# Patient Record
Sex: Female | Born: 1955
Health system: Southern US, Community
[De-identification: ages and names within clinical notes are randomized; demographics above are authoritative.]

## PROBLEM LIST (undated history)

## (undated) DIAGNOSIS — Z889 Allergy status to unspecified drugs, medicaments and biological substances status: Secondary | ICD-10-CM

## (undated) DIAGNOSIS — T7840XA Allergy, unspecified, initial encounter: Secondary | ICD-10-CM

## (undated) DIAGNOSIS — C50919 Malignant neoplasm of unspecified site of unspecified female breast: Secondary | ICD-10-CM

## (undated) DIAGNOSIS — C801 Malignant (primary) neoplasm, unspecified: Secondary | ICD-10-CM

## (undated) DIAGNOSIS — Z923 Personal history of irradiation: Secondary | ICD-10-CM

## (undated) DIAGNOSIS — Z800 Family history of malignant neoplasm of digestive organs: Secondary | ICD-10-CM

## (undated) DIAGNOSIS — J189 Pneumonia, unspecified organism: Secondary | ICD-10-CM

## (undated) DIAGNOSIS — Z973 Presence of spectacles and contact lenses: Secondary | ICD-10-CM

## (undated) DIAGNOSIS — E7800 Pure hypercholesterolemia, unspecified: Secondary | ICD-10-CM

## (undated) DIAGNOSIS — I10 Essential (primary) hypertension: Secondary | ICD-10-CM

## (undated) DIAGNOSIS — Z803 Family history of malignant neoplasm of breast: Secondary | ICD-10-CM

## (undated) DIAGNOSIS — R7303 Prediabetes: Secondary | ICD-10-CM

## (undated) DIAGNOSIS — Z972 Presence of dental prosthetic device (complete) (partial): Secondary | ICD-10-CM

## (undated) HISTORY — PX: WISDOM TOOTH EXTRACTION: SHX21

## (undated) HISTORY — DX: Malignant (primary) neoplasm, unspecified: C80.1

## (undated) HISTORY — PX: COLONOSCOPY: SHX174

## (undated) HISTORY — DX: Family history of malignant neoplasm of breast: Z80.3

## (undated) HISTORY — PX: TUBAL LIGATION: SHX77

## (undated) HISTORY — PX: BREAST LUMPECTOMY: SHX2

## (undated) HISTORY — DX: Allergy, unspecified, initial encounter: T78.40XA

## (undated) HISTORY — DX: Family history of malignant neoplasm of digestive organs: Z80.0

## (undated) HISTORY — PX: ABDOMINAL HYSTERECTOMY: SHX81

## (undated) HISTORY — DX: Malignant neoplasm of unspecified site of unspecified female breast: C50.919

---

## 2000-07-02 ENCOUNTER — Other Ambulatory Visit: Admission: RE | Admit: 2000-07-02 | Discharge: 2000-07-02 | Payer: Self-pay | Admitting: *Deleted

## 2001-02-13 ENCOUNTER — Other Ambulatory Visit: Admission: RE | Admit: 2001-02-13 | Discharge: 2001-02-13 | Payer: Self-pay | Admitting: *Deleted

## 2001-02-13 ENCOUNTER — Encounter: Admission: RE | Admit: 2001-02-13 | Discharge: 2001-02-13 | Payer: Self-pay | Admitting: *Deleted

## 2001-02-13 ENCOUNTER — Encounter: Payer: Self-pay | Admitting: *Deleted

## 2001-02-13 ENCOUNTER — Encounter (INDEPENDENT_AMBULATORY_CARE_PROVIDER_SITE_OTHER): Payer: Self-pay | Admitting: *Deleted

## 2001-03-03 ENCOUNTER — Encounter (INDEPENDENT_AMBULATORY_CARE_PROVIDER_SITE_OTHER): Payer: Self-pay | Admitting: Specialist

## 2001-03-03 ENCOUNTER — Ambulatory Visit (HOSPITAL_BASED_OUTPATIENT_CLINIC_OR_DEPARTMENT_OTHER): Admission: RE | Admit: 2001-03-03 | Discharge: 2001-03-03 | Payer: Self-pay | Admitting: *Deleted

## 2003-04-08 ENCOUNTER — Other Ambulatory Visit: Admission: RE | Admit: 2003-04-08 | Discharge: 2003-04-08 | Payer: Self-pay | Admitting: *Deleted

## 2005-01-02 ENCOUNTER — Other Ambulatory Visit: Admission: RE | Admit: 2005-01-02 | Discharge: 2005-01-02 | Payer: Self-pay | Admitting: *Deleted

## 2006-03-12 ENCOUNTER — Other Ambulatory Visit: Admission: RE | Admit: 2006-03-12 | Discharge: 2006-03-12 | Payer: Self-pay | Admitting: *Deleted

## 2008-01-22 ENCOUNTER — Other Ambulatory Visit: Admission: RE | Admit: 2008-01-22 | Discharge: 2008-01-22 | Payer: Self-pay | Admitting: Gynecology

## 2008-06-03 ENCOUNTER — Emergency Department (HOSPITAL_COMMUNITY): Admission: EM | Admit: 2008-06-03 | Discharge: 2008-06-04 | Payer: Self-pay | Admitting: Emergency Medicine

## 2008-07-02 ENCOUNTER — Ambulatory Visit: Payer: Self-pay | Admitting: Internal Medicine

## 2008-07-16 ENCOUNTER — Encounter: Payer: Self-pay | Admitting: Internal Medicine

## 2008-07-16 ENCOUNTER — Ambulatory Visit: Payer: Self-pay | Admitting: Internal Medicine

## 2008-07-19 ENCOUNTER — Encounter: Payer: Self-pay | Admitting: Internal Medicine

## 2008-08-03 ENCOUNTER — Encounter: Admission: RE | Admit: 2008-08-03 | Discharge: 2008-08-03 | Payer: Self-pay | Admitting: Radiology

## 2008-08-10 ENCOUNTER — Ambulatory Visit (HOSPITAL_COMMUNITY): Admission: RE | Admit: 2008-08-10 | Discharge: 2008-08-10 | Payer: Self-pay | Admitting: General Surgery

## 2008-08-10 ENCOUNTER — Encounter (INDEPENDENT_AMBULATORY_CARE_PROVIDER_SITE_OTHER): Payer: Self-pay | Admitting: General Surgery

## 2008-08-13 ENCOUNTER — Ambulatory Visit: Payer: Self-pay | Admitting: Oncology

## 2008-08-17 ENCOUNTER — Ambulatory Visit: Admission: RE | Admit: 2008-08-17 | Discharge: 2008-11-15 | Payer: Self-pay | Admitting: Radiation Oncology

## 2008-10-18 ENCOUNTER — Ambulatory Visit: Payer: Self-pay | Admitting: Oncology

## 2008-11-01 LAB — COMPREHENSIVE METABOLIC PANEL
ALT: 12 U/L (ref 0–35)
AST: 17 U/L (ref 0–37)
Albumin: 4 g/dL (ref 3.5–5.2)
Alkaline Phosphatase: 37 U/L — ABNORMAL LOW (ref 39–117)
BUN: 16 mg/dL (ref 6–23)
CO2: 32 mEq/L (ref 19–32)
Calcium: 9.8 mg/dL (ref 8.4–10.5)
Chloride: 97 mEq/L (ref 96–112)
Creatinine, Ser: 1.07 mg/dL (ref 0.40–1.20)
Glucose, Bld: 106 mg/dL — ABNORMAL HIGH (ref 70–99)
Potassium: 3.7 mEq/L (ref 3.5–5.3)
Sodium: 139 mEq/L (ref 135–145)
Total Bilirubin: 0.6 mg/dL (ref 0.3–1.2)
Total Protein: 7.5 g/dL (ref 6.0–8.3)

## 2008-11-01 LAB — CBC WITH DIFFERENTIAL/PLATELET
BASO%: 0.4 % (ref 0.0–2.0)
Basophils Absolute: 0 10*3/uL (ref 0.0–0.1)
EOS%: 8.6 % — ABNORMAL HIGH (ref 0.0–7.0)
Eosinophils Absolute: 0.3 10*3/uL (ref 0.0–0.5)
HCT: 35.4 % (ref 34.8–46.6)
HGB: 12.1 g/dL (ref 11.6–15.9)
LYMPH%: 18.8 % (ref 14.0–48.0)
MCH: 31.8 pg (ref 26.0–34.0)
MCHC: 34.1 g/dL (ref 32.0–36.0)
MCV: 93.3 fL (ref 81.0–101.0)
MONO#: 0.3 10*3/uL (ref 0.1–0.9)
MONO%: 7.1 % (ref 0.0–13.0)
NEUT#: 2.5 10*3/uL (ref 1.5–6.5)
NEUT%: 65.1 % (ref 39.6–76.8)
Platelets: 239 10*3/uL (ref 145–400)
RBC: 3.8 10*6/uL (ref 3.70–5.32)
RDW: 13.2 % (ref 11.3–14.5)
WBC: 3.9 10*3/uL (ref 3.9–10.0)
lymph#: 0.7 10*3/uL — ABNORMAL LOW (ref 0.9–3.3)

## 2009-01-03 ENCOUNTER — Ambulatory Visit: Payer: Self-pay | Admitting: Oncology

## 2009-01-05 LAB — CBC WITH DIFFERENTIAL/PLATELET
BASO%: 0.7 % (ref 0.0–2.0)
Basophils Absolute: 0 10*3/uL (ref 0.0–0.1)
EOS%: 1.4 % (ref 0.0–7.0)
Eosinophils Absolute: 0 10*3/uL (ref 0.0–0.5)
HCT: 34.9 % (ref 34.8–46.6)
HGB: 12.1 g/dL (ref 11.6–15.9)
LYMPH%: 34.3 % (ref 14.0–49.7)
MCH: 32.1 pg (ref 25.1–34.0)
MCHC: 34.6 g/dL (ref 31.5–36.0)
MCV: 92.7 fL (ref 79.5–101.0)
MONO#: 0.2 10*3/uL (ref 0.1–0.9)
MONO%: 8 % (ref 0.0–14.0)
NEUT#: 1.6 10*3/uL (ref 1.5–6.5)
NEUT%: 55.6 % (ref 38.4–76.8)
Platelets: 238 10*3/uL (ref 145–400)
RBC: 3.76 10*6/uL (ref 3.70–5.45)
RDW: 13 % (ref 11.2–14.5)
WBC: 2.8 10*3/uL — ABNORMAL LOW (ref 3.9–10.3)
lymph#: 1 10*3/uL (ref 0.9–3.3)

## 2009-01-05 LAB — COMPREHENSIVE METABOLIC PANEL
ALT: <8 U/L (ref 0–35)
AST: 11 U/L (ref 0–37)
Albumin: 4.2 g/dL (ref 3.5–5.2)
Alkaline Phosphatase: 43 U/L (ref 39–117)
BUN: 7 mg/dL (ref 6–23)
CO2: 29 mEq/L (ref 19–32)
Calcium: 9.7 mg/dL (ref 8.4–10.5)
Chloride: 99 mEq/L (ref 96–112)
Creatinine, Ser: 0.76 mg/dL (ref 0.40–1.20)
Glucose, Bld: 88 mg/dL (ref 70–99)
Potassium: 3.6 mEq/L (ref 3.5–5.3)
Sodium: 138 mEq/L (ref 135–145)
Total Bilirubin: 0.5 mg/dL (ref 0.3–1.2)
Total Protein: 7.5 g/dL (ref 6.0–8.3)

## 2009-04-22 ENCOUNTER — Ambulatory Visit: Payer: Self-pay | Admitting: Oncology

## 2009-04-27 LAB — COMPREHENSIVE METABOLIC PANEL
ALT: 8 U/L (ref 0–35)
AST: 13 U/L (ref 0–37)
Albumin: 4.3 g/dL (ref 3.5–5.2)
Alkaline Phosphatase: 43 U/L (ref 39–117)
BUN: 10 mg/dL (ref 6–23)
CO2: 29 mEq/L (ref 19–32)
Calcium: 9.8 mg/dL (ref 8.4–10.5)
Chloride: 101 mEq/L (ref 96–112)
Creatinine, Ser: 0.9 mg/dL (ref 0.40–1.20)
Glucose, Bld: 93 mg/dL (ref 70–99)
Potassium: 3.8 mEq/L (ref 3.5–5.3)
Sodium: 139 mEq/L (ref 135–145)
Total Bilirubin: 0.4 mg/dL (ref 0.3–1.2)
Total Protein: 7.3 g/dL (ref 6.0–8.3)

## 2009-04-27 LAB — CBC WITH DIFFERENTIAL/PLATELET
BASO%: 0.6 % (ref 0.0–2.0)
Basophils Absolute: 0 10*3/uL (ref 0.0–0.1)
EOS%: 1.7 % (ref 0.0–7.0)
Eosinophils Absolute: 0.1 10*3/uL (ref 0.0–0.5)
HCT: 35.3 % (ref 34.8–46.6)
HGB: 12 g/dL (ref 11.6–15.9)
LYMPH%: 38.2 % (ref 14.0–49.7)
MCH: 31.8 pg (ref 25.1–34.0)
MCHC: 34.1 g/dL (ref 31.5–36.0)
MCV: 93.3 fL (ref 79.5–101.0)
MONO#: 0.3 10*3/uL (ref 0.1–0.9)
MONO%: 8.5 % (ref 0.0–14.0)
NEUT#: 1.6 10*3/uL (ref 1.5–6.5)
NEUT%: 51 % (ref 38.4–76.8)
Platelets: 269 10*3/uL (ref 145–400)
RBC: 3.78 10*6/uL (ref 3.70–5.45)
RDW: 13.4 % (ref 11.2–14.5)
WBC: 3.2 10*3/uL — ABNORMAL LOW (ref 3.9–10.3)
lymph#: 1.2 10*3/uL (ref 0.9–3.3)

## 2009-07-25 ENCOUNTER — Ambulatory Visit: Payer: Self-pay | Admitting: Oncology

## 2010-11-02 NOTE — Procedures (Signed)
Summary: Colonoscopy   Colonoscopy  Procedure date:  07/16/2008  Findings:      Location:  [facility].    Procedures Next Due Date:    Colonoscopy: 07/2015  Patient Name: Emily Ross, Emily Ross. MRN:  Procedure Procedures: Colonoscopy CPT: [phone].    with biopsy. CPT: Q5068410.  Personnel: Endoscopist: Dora L. Juanda Chance, MD.  Exam Location: Exam performed in Outpatient Clinic. Outpatient  Patient Consent: Procedure, Alternatives, Risks and Benefits discussed, consent obtained, from patient. Consent was obtained by the RN.  Indications  Average Risk Screening Routine.  History  Current Medications: Patient is not currently taking Coumadin.  Pre-Exam Physical: Performed Jul 16, 2008. Entire physical exam was normal.  Comments: Pt. history reviewed/updated, physical exam performed prior to initiation of sedation?yes Exam Exam: Extent of exam reached: Cecum, extent intended: Cecum.  The cecum was identified by appendiceal orifice and IC valve. Duration of exam: time in 8:51 min, out 8:10 min minutes. Colon retroflexion performed. Images taken. ASA Classification: I. Tolerance: good.  Monitoring: Pulse and BP monitoring, Oximetry used. Supplemental O2 given.  Colon Prep Used Miralax for colon prep. Prep results: good.  Sedation Meds: Patient assessed and found to be appropriate for moderate (conscious) sedation. Fentanyl 75 mcg. given IV. Versed 9 mg. given IV.  Findings POLYP: Descending Colon, Maximum size: 4 mm. sessile polyp. Distance from Anus 50 cm. Procedure:  biopsy without cautery, The polyp was removed piece meal. removed, retrieved, Polyp sent to pathology. ICD9: Colon Polyps: 211.3.  - NORMAL EXAM: Cecum.  - NORMAL EXAM: Rectum.   Assessment Abnormal examination, see findings above.  Diagnoses: 211.3: Colon Polyps.   Comments: left colon polyp removed with cold biopsies Events  Unplanned Interventions: No intervention was required.    Unplanned Events: There were no complications. Plans Medication Plan: Await pathology.  Patient Education: Patient given standard instructions for: Patient instructed to get routine colonoscopy every 7 years.  Comments: recall 7-10 years depending on polyp report Disposition: After procedure patient sent to recovery. After recovery patient sent home.    cc: Della Goo, MD   REPORT OF SURGICAL PATHOLOGY   Case #: [id number] Patient Name: Emily Ross, Emily Ross Office Chart Number:  [id number]   MRN: [id number] Pathologist: Alden Server A. Delila Spence, MD DOB/Age  [birth year] (Age: 38)    Gender: F Date Taken:  07/16/2008 Date Received: 07/16/2008   FINAL DIAGNOSIS   ***MICROSCOPIC EXAMINATION AND DIAGNOSIS***   COLON AT 50 CM, POLYP(S):  HYPERPLASTIC POLYP.  NO ADENOMATOUS CHANGE OR MALIGNANCY IDENTIFIED.   mj Date Reported:  07/19/2008     Alden Server A. Delila Spence, MD *** Electronically Signed Out By EAA ***     July 19, 2008 MRN: [id number]    [facility] [address] [place], [place]  [address]    Dear Ms. CHAPPELL,  I am pleased to inform you that the colon polyp(s) removed during your recent colonoscopy was (were) found to be benign (no cancer detected) upon pathologic examination.The polyp was hyperplastic ( not precancerous)  I recommend you have a repeat colonoscopy examination in _7 years to look for recurrent polyps, as having colon polyps increases your risk for having recurrent polyps or even colon cancer in the future.  Should you develop new or worsening symptoms of abdominal pain, bowel habit changes or bleeding from the rectum or bowels, please schedule an evaluation with either your primary care physician or with me.  Additional information/recommendations:  _x_ No further action with gastroenterology is needed at this time. Please  follow-up with your primary care physician for your other healthcare      needs.   Please call us if you  are having persistent problems or have questions about your condition that have not been fully answered at this time.  Sincerely,  Hart Carwin MD  This letter has been electronically signed by your physician.   Signed by Hart Carwin MD on 07/19/2008 at 9:36 PM  ________________________________________________________________________  This report was created from the original endoscopy report, which was reviewed and signed by the above listed endoscopist.

## 2010-11-02 NOTE — Letter (Signed)
Summary: Patient Notice- Polyp Results  [facility]  [address] [place], [place] [address]   Phone: [phone]  Fax: [phone]        July 19, 2008 MRN: [id number]    [facility] [address] [place], [place]  [address]    Dear Ms. CHAPPELL,  I am pleased to inform you that the colon polyp(s) removed during your recent colonoscopy was (were) found to be benign (no cancer detected) upon pathologic examination.The polyp was hyperplastic ( not precancerous)  I recommend you have a repeat colonoscopy examination in _7 years to look for recurrent polyps, as having colon polyps increases your risk for having recurrent polyps or even colon cancer in the future.  Should you develop new or worsening symptoms of abdominal pain, bowel habit changes or bleeding from the rectum or bowels, please schedule an evaluation with either your primary care physician or with me.  Additional information/recommendations:  _x_ No further action with gastroenterology is needed at this time. Please      follow-up with your primary care physician for your other healthcare      needs.   Please call us if you are having persistent problems or have questions about your condition that have not been fully answered at this time.  Sincerely,  Hart Carwin MD  This letter has been electronically signed by your physician.

## 2011-02-13 NOTE — Op Note (Signed)
NAME:  Ross, Emily             ACCOUNT NO.:  [id number]   MEDICAL RECORD NO.:  [id number]          PATIENT TYPE:  AMB   LOCATION:  SDS                          FACILITY:  [facility]   PHYSICIAN:  Lennie Muckle, MD      DATE OF BIRTH:  1956/04/23   DATE OF PROCEDURE:  08/10/2008  DATE OF DISCHARGE:  08/10/2008                               OPERATIVE REPORT   PREOPERATIVE DIAGNOSIS:  Ductal carcinoma in situ, left breast.   POSTOPERATIVE DIAGNOSIS:  Ductal carcinoma in situ, left breast.   PROCEDURE:  Left needle-localized left breast lumpectomy.   SURGEON:  Lennie Muckle, MD   ASSISTANT:  Kelle Darting. Rennis Harding, NP   ANESTHESIA:  General endotracheal anesthesia.Marland Kitchen   FINDINGS:  Wire in position in the middle of the specimen confirmed by  [facility].  I also took an additional medial inferior  margin of the breast.   ESTIMATED BLOOD LOSS:  100 mL.   COMPLICATIONS:  No immediate complications.   DRAINS:  No drains were placed.   INDICATIONS FOR PROCEDURE:  [name] is a 55 year old female who had  abnormal screening mammogram.  Biopsy revealed DCIS.  Specimen appeared  to be 0.5 cm in size.  Due to the size and inability to palpate, I  discussed with the patient performing on needle-localized left breast  lumpectomy.  The risk of surgery including but not limited to bleeding,  infection, hematoma, and possible need a re-excision were explained to  [name].  Informed consent was obtained prior to the procedure.   PROCEDURE IN DETAIL:  [name] was identified in the preoperative  holding area.  She had come from [facility] where she had had the  wire placed in her left breast.  I had marked the left breast  preoperatively.  She received 1 g of Kefzol and taken to the operating  room.  Once she was the operating room, she was placed in the supine  position.  Her left breast was prepped and draped in the usual sterile  fashion.  A time-out procedure  indicating the patient and procedure were  performed.  I placed an incision in the left breast just around the  areola.  I created a skin flap superiorly to bring the wire into the  wound.  I then using electrocautery dissected out the specimen.  I  attempted to keep the wire in position with an Allis clamp.  After the  specimen was removed, bleeding was controlled with electrocautery.  I  elected to take an additional inferior medial margin due to the  proximity and closeness of the wire.  The specimen sent to Dr. Yolanda Bonine  for analysis.  The wire was in the middle of the specimen.  I did orient  the specimen with suture material.  Please see the pathology for this.  I then irrigated the wound bed.  A small amount of oozing was controlled  with electrocautery.  Final irrigation revealed no bleeding.  I then  reapproximated the breast tissue with a 3-0 Vicryl suture.  Dermis was  closed with 3-0 Vicryl  and skin was closed with 4-0 Monocryl.  Steri-  Strip was placed for final dressing.  The patient had a fluff dressing  and then had her brassiere applied in the operating room.  She was then  extubated and transferred to the Postanesthesia Care Unit in stable  condition.   She will follow up with me in approximately 2-3 weeks.  I will call her  this Friday when her pathology becomes available.      Lennie Muckle, MD  Electronically Signed     ALA/MEDQ  D:  08/10/2008  T:  08/11/2008  Job:  [id number]   cc:   Claris Che L. Yolanda Bonine, MD  Della Goo, M.D.

## 2011-05-22 ENCOUNTER — Emergency Department (INDEPENDENT_AMBULATORY_CARE_PROVIDER_SITE_OTHER): Payer: Self-pay

## 2011-05-22 ENCOUNTER — Emergency Department (HOSPITAL_BASED_OUTPATIENT_CLINIC_OR_DEPARTMENT_OTHER)
Admission: EM | Admit: 2011-05-22 | Discharge: 2011-05-22 | Disposition: A | Discharge status: Home / Self Care | Payer: Self-pay | Attending: Emergency Medicine | Admitting: Emergency Medicine

## 2011-05-22 ENCOUNTER — Encounter: Payer: Self-pay | Admitting: Student

## 2011-05-22 DIAGNOSIS — W19XXXA Unspecified fall, initial encounter: Secondary | ICD-10-CM

## 2011-05-22 DIAGNOSIS — S81009A Unspecified open wound, unspecified knee, initial encounter: Secondary | ICD-10-CM | POA: Insufficient documentation

## 2011-05-22 DIAGNOSIS — I10 Essential (primary) hypertension: Secondary | ICD-10-CM | POA: Insufficient documentation

## 2011-05-22 DIAGNOSIS — M25569 Pain in unspecified knee: Secondary | ICD-10-CM

## 2011-05-22 DIAGNOSIS — S81012A Laceration without foreign body, left knee, initial encounter: Secondary | ICD-10-CM

## 2011-05-22 HISTORY — DX: Essential (primary) hypertension: I10

## 2011-05-22 MED ORDER — TETANUS-DIPHTH-ACELL PERTUSSIS 5-2.5-18.5 LF-MCG/0.5 IM SUSP
0.5000 mL | Freq: Once | INTRAMUSCULAR | Status: AC
  Administered 2011-05-22: 0.5 mL via INTRAMUSCULAR
  Filled 2011-05-22: qty 0.5

## 2011-05-22 MED ORDER — LIDOCAINE HCL (PF) 1 % IJ SOLN
30.0000 mL | Freq: Once | INTRAMUSCULAR | Status: AC
  Administered 2011-05-22: 30 mL
  Filled 2011-05-22: qty 5

## 2011-05-22 NOTE — ED Notes (Signed)
Pt in with c/o left knee pain s/p fall today, able bear weight.

## 2011-05-22 NOTE — ED Provider Notes (Signed)
History     CSN: [id number] Arrival date & time: 05/22/2011  7:39 PM  Chief Complaint  Patient presents with  . Knee Pain   Patient is a 55 y.o. female presenting with knee pain. The history is provided by the patient.  Knee Pain This is a new problem. The current episode started today. The problem occurs constantly. The problem has been unchanged. The symptoms are aggravated by nothing. She has tried nothing for the symptoms. The treatment provided mild relief.  Knee Pain This is a new problem. The current episode started today. The problem occurs constantly. The problem has been unchanged. The symptoms are aggravated by nothing. She has tried nothing for the symptoms. The treatment provided mild relief.  Pt reports she slipped and fell and hit her left knee.  Pt reports a rock stuck in her knee.  Pt pulled out rock.  Past Medical History  Diagnosis Date  . Hypertension     Past Surgical History  Procedure Date  . Breast lumpectomy     History reviewed. No pertinent family history.  History  Substance Use Topics  . Smoking status: Never Smoker   . Smokeless tobacco: Never Used  . Alcohol Use: No    OB History    Grav Para Term Preterm Abortions TAB SAB Ect Mult Living                  Review of Systems  Constitutional: Negative.   Skin: Positive for wound.  All other systems reviewed and are negative.    Physical Exam  BP 154/81  Pulse 84  Temp(Src) 98.6 F (37 C) (Oral)  Resp 20  SpO2 98%  LMP 04/25/2011  Physical Exam  Nursing note and vitals reviewed. Constitutional: She is oriented to person, place, and time. She appears well-developed and well-nourished.  HENT:  Head: Normocephalic and atraumatic.  Eyes: Conjunctivae and EOM are normal. Pupils are equal, round, and reactive to light.  Neck: Normal range of motion.  Musculoskeletal:       2cm laceration left knee gapping,  Obvious foreign bodies.  Neurological: She is alert and oriented to person,  place, and time.  Psychiatric: She has a normal mood and affect.    ED Course  LACERATION REPAIR Performed by: Langston Masker Authorized by: Osvaldo Human Consent: Verbal consent obtained. Consent given by: patient Patient identity confirmed: verbally with patient Time out: Immediately prior to procedure a "time out" was called to verify the correct patient, procedure, equipment, support staff and site/side marked as required. Body area: lower extremity Laceration length: 2 cm Foreign bodies: unknown Tendon involvement: none Nerve involvement: none Anesthesia: local infiltration Local anesthetic: lidocaine 1% without epinephrine Patient sedated: no Preparation: Patient was prepped and draped in the usual sterile fashion. Irrigation solution: saline Irrigation method: syringe Amount of cleaning: standard Debridement: minimal Skin closure: 5-0 Prolene Technique: simple Approximation: close Approximation difficulty: simple Comments: Multiple small foreign bodys removed,  Pt counseled on foreign bodies    MDM Pt counseled on foreign bodies and wound care.  Pt advised to have sutures removed in 8 days.  Medical screening examination/treatment/procedure(s) were performed by non-physician practitioner and as supervising physician I was immediately available for consultation/collaboration. Osvaldo Human, M.D.    [place], [place] 05/22/11 2111  Langston Masker, [place] 05/22/11 2112  Carleene Cooper III, MD 05/23/11 [phone]

## 2011-07-03 LAB — COMPREHENSIVE METABOLIC PANEL
ALT: 9
AST: 16
Albumin: 3.8
Alkaline Phosphatase: 48
BUN: 7
CO2: 32
Calcium: 9.4
Chloride: 100
Creatinine, Ser: 0.84
GFR calc Af Amer: >60
GFR calc non Af Amer: >60
Glucose, Bld: 96
Potassium: 3.6
Sodium: 137
Total Bilirubin: 0.5
Total Protein: 7.2

## 2011-07-03 LAB — DIFFERENTIAL
Basophils Absolute: 0
Basophils Relative: 1
Eosinophils Absolute: 0.1
Eosinophils Relative: 3
Lymphocytes Relative: 43
Lymphs Abs: 1.9
Monocytes Absolute: 0.5
Monocytes Relative: 11
Neutro Abs: 1.9
Neutrophils Relative %: 43

## 2011-07-03 LAB — CBC
HCT: 36.6
Hemoglobin: 12.7
MCHC: 34.7
MCV: 92.4
Platelets: 264
RBC: 3.97
RDW: 13.6
WBC: 4.4

## 2011-07-03 LAB — CANCER ANTIGEN 27.29: CA 27.29: 30

## 2011-07-03 LAB — LACTATE DEHYDROGENASE: LDH: 101

## 2011-10-08 ENCOUNTER — Other Ambulatory Visit: Payer: Self-pay | Admitting: Obstetrics and Gynecology

## 2011-10-08 DIAGNOSIS — Z1231 Encounter for screening mammogram for malignant neoplasm of breast: Secondary | ICD-10-CM

## 2011-10-09 ENCOUNTER — Ambulatory Visit (HOSPITAL_COMMUNITY): Payer: Self-pay | Attending: Obstetrics and Gynecology

## 2011-10-09 ENCOUNTER — Ambulatory Visit (INDEPENDENT_AMBULATORY_CARE_PROVIDER_SITE_OTHER): Payer: Self-pay | Admitting: *Deleted

## 2011-10-09 VITALS — BP 140/85 | HR 68 | Temp 98.8°F | Resp 18 | Ht 67.0 in | Wt 163.3 lb

## 2011-10-09 DIAGNOSIS — N63 Unspecified lump in unspecified breast: Secondary | ICD-10-CM

## 2011-10-09 DIAGNOSIS — Z01419 Encounter for gynecological examination (general) (routine) without abnormal findings: Secondary | ICD-10-CM

## 2011-10-09 DIAGNOSIS — N6320 Unspecified lump in the left breast, unspecified quadrant: Secondary | ICD-10-CM

## 2011-10-09 NOTE — Progress Notes (Signed)
No complaints today.  Pap Smear:    Pap smear completed today. Patient thought she had her last Pap smear in spring 2011 at the free Pap smear screening but unsure. Completed Pap smear since have no records of previous Pap smears.  Per patient has no history of abnormal Pap smears. If today's Pap smear is normal patient will need her next Pap smear January 2016. No Pap smear results are in EPIC.  Physical exam: Breasts Breasts symmetrical. No skin abnormalities right  Breast. Skin darkening on left breast where patient had a lumpectomy in 2009. No nipple retraction bilateral breasts. No nipple discharge bilateral breasts. No lymphadenopathy. No lumps palpated right breast. Palpated lump within left breast at site of lumpectomy scar at 2 o'clock around 5 cm from nipple. Referred patient to [facility] for diagnostic mammogram and possible left breast ultrasound. Appointment scheduled October 11, 2011 at 0900.          Pelvic/Bimanual   Ext Genitalia No lesions, no swelling and no discharge observed on external genitalia.         Vagina Vagina pink and normal texture. No lesions observed in vagina. Observed small amount of white discharge in vagina.          Cervix Cervix is present. Cervix pink, slightly red at spots around os and rough appearing. No discharge observed from cervical os.     Uterus Uterus is present and palpable. Uterus in normal position and normal size.        Adnexae Bilateral ovaries present and palpable. No tenderness on palpation.          Rectovaginal No rectal exam completed today since patient had no rectal complaints. No skin abnormalities observed on exam.

## 2011-10-09 NOTE — Patient Instructions (Signed)
Taught patient how to perform BSE and gave educational materials to take home. Completed Pap smear today since unsure of when had last Pap smear.  Let her know [facility] will cover Pap smears every 3 years unless has a history of abnormal Pap smears. Patient is scheduled for a diagnostic mammogram and possible left breast ultrasound on Thursday, October 11, 2011 at 0900 at [facility]. Patient aware of appointment and will be there. Let patient know will follow up with her within the next couple weeks with results by letter or phone. Patient verbalized understanding.

## 2011-10-19 ENCOUNTER — Encounter: Payer: Self-pay | Admitting: Oncology

## 2011-10-25 ENCOUNTER — Telehealth: Payer: Self-pay | Admitting: *Deleted

## 2011-10-25 ENCOUNTER — Encounter: Payer: Self-pay | Admitting: *Deleted

## 2011-10-25 ENCOUNTER — Encounter: Payer: Self-pay | Admitting: Obstetrics and Gynecology

## 2011-10-25 NOTE — Telephone Encounter (Signed)
Sent patient letter with Pap smear results. Patient was referred for Diagnostic Mammogram at [facility] and was given results day of exam.

## 2011-12-06 ENCOUNTER — Encounter: Payer: Self-pay | Admitting: Student

## 2012-01-03 ENCOUNTER — Emergency Department (HOSPITAL_BASED_OUTPATIENT_CLINIC_OR_DEPARTMENT_OTHER)
Admission: EM | Admit: 2012-01-03 | Discharge: 2012-01-03 | Disposition: A | Discharge status: Home / Self Care | Payer: Self-pay | Attending: Emergency Medicine | Admitting: Emergency Medicine

## 2012-01-03 ENCOUNTER — Other Ambulatory Visit: Payer: Self-pay

## 2012-01-03 ENCOUNTER — Encounter (HOSPITAL_BASED_OUTPATIENT_CLINIC_OR_DEPARTMENT_OTHER): Payer: Self-pay | Admitting: *Deleted

## 2012-01-03 ENCOUNTER — Emergency Department (INDEPENDENT_AMBULATORY_CARE_PROVIDER_SITE_OTHER): Payer: Self-pay

## 2012-01-03 DIAGNOSIS — I10 Essential (primary) hypertension: Secondary | ICD-10-CM

## 2012-01-03 DIAGNOSIS — Z7982 Long term (current) use of aspirin: Secondary | ICD-10-CM | POA: Insufficient documentation

## 2012-01-03 DIAGNOSIS — Z79899 Other long term (current) drug therapy: Secondary | ICD-10-CM | POA: Insufficient documentation

## 2012-01-03 DIAGNOSIS — R002 Palpitations: Secondary | ICD-10-CM | POA: Insufficient documentation

## 2012-01-03 DIAGNOSIS — R0602 Shortness of breath: Secondary | ICD-10-CM

## 2012-01-03 DIAGNOSIS — R42 Dizziness and giddiness: Secondary | ICD-10-CM | POA: Insufficient documentation

## 2012-01-03 LAB — CBC
HCT: 37.2 % (ref 36.0–46.0)
Hemoglobin: 12.4 g/dL (ref 12.0–15.0)
MCH: 30.5 pg (ref 26.0–34.0)
MCHC: 33.3 g/dL (ref 30.0–36.0)
MCV: 91.4 fL (ref 78.0–100.0)
Platelets: 256 10*3/uL (ref 150–400)
RBC: 4.07 MIL/uL (ref 3.87–5.11)
RDW: 12.5 % (ref 11.5–15.5)
WBC: 3 10*3/uL — ABNORMAL LOW (ref 4.0–10.5)

## 2012-01-03 LAB — BASIC METABOLIC PANEL
BUN: 11 mg/dL (ref 6–23)
CO2: 31 mEq/L (ref 19–32)
Calcium: 10.2 mg/dL (ref 8.4–10.5)
Chloride: 103 mEq/L (ref 96–112)
Creatinine, Ser: 0.7 mg/dL (ref 0.50–1.10)
GFR calc Af Amer: >90 mL/min (ref >90)
GFR calc non Af Amer: >90 mL/min (ref >90)
Glucose, Bld: 88 mg/dL (ref 70–99)
Potassium: 4.2 mEq/L (ref 3.5–5.1)
Sodium: 140 mEq/L (ref 135–145)

## 2012-01-03 LAB — TROPONIN I: Troponin I: <0.3 ng/mL (ref <0.30)

## 2012-01-03 MED ORDER — METOPROLOL TARTRATE 50 MG PO TABS: 50.0000 mg | ORAL_TABLET | Freq: Two times a day (BID) | ORAL | Status: DC

## 2012-01-03 NOTE — ED Provider Notes (Signed)
History     CSN: [id number]  Arrival date & time 01/03/12  1134   First MD Initiated Contact with Patient 01/03/12 1248      Chief Complaint  Patient presents with  . Hypertension    (Consider location/radiation/quality/duration/timing/severity/associated sxs/prior treatment) HPI Comments: Pt states that she has had sob and palpitation and a feeling light headed:pt states that she is went to her pcp today , but she wasn't in so she came here because she is almost out of her bp medications  Patient is a 56 y.o. female presenting with hypertension. The history is provided by the patient. No language interpreter was used.  Hypertension This is a chronic problem. The current episode started 1 to 4 weeks ago. The problem has been gradually worsening. Pertinent negatives include no chest pain, coughing, diaphoresis, fever or headaches. The symptoms are aggravated by nothing. She has tried nothing for the symptoms.    Past Medical History  Diagnosis Date  . Hypertension     Past Surgical History  Procedure Date  . Breast lumpectomy     Family History  Problem Relation Age of Onset  . Diabetes Father   . Hypertension Father   . Diabetes Mother   . Hypertension Mother   . Breast cancer Sister 40    History  Substance Use Topics  . Smoking status: Never Smoker   . Smokeless tobacco: Not on file  . Alcohol Use: No    OB History    Grav Para Term Preterm Abortions TAB SAB Ect Mult Living   3         3      Review of Systems  Constitutional: Negative for fever and diaphoresis.  Respiratory: Negative for cough.   Cardiovascular: Negative for chest pain.  Gastrointestinal: Negative.   Musculoskeletal: Negative.   Neurological: Negative for headaches.    Allergies  Review of patient's allergies indicates no known allergies.  Home Medications   Current Outpatient Rx  Name Route Sig Dispense Refill  . ASPIRIN 81 MG PO TABS Oral Take 160 mg by mouth daily.      .  ASPIRIN-CAFFEINE 500-32.5 MG PO TABS Oral Take 2 tablets by mouth daily as needed. pain     . VITAMIN D 1000 UNITS PO TABS Oral Take 1,000 Units by mouth 2 (two) times daily.      Marland Kitchen VITAMIN D3 1000 UNITS PO CAPS Oral Take 1 capsule by mouth daily.      Marland Kitchen EVENING PRIMROSE OIL PO Oral Take 1 capsule by mouth daily.      Marland Kitchen METOPROLOL TARTRATE 50 MG PO TABS Oral Take 50 mg by mouth 2 (two) times daily.      Marland Kitchen METOPROLOL SUCCINATE ER 50 MG PO TB24 Oral Take 50 mg by mouth 2 (two) times daily.      Marland Kitchen VISINE OP Both Eyes Place 1 drop into both eyes daily.        BP 165/94  Pulse 87  Temp(Src) 98.3 F (36.8 C) (Oral)  Resp 20  Ht 5\' 7"  (1.702 m)  Wt 165 lb (74.844 kg)  BMI 25.84 kg/m2  SpO2 97%  LMP 12/03/2011  Physical Exam  Nursing note and vitals reviewed. Constitutional: She is oriented to person, place, and time. She appears well-developed and well-nourished.  HENT:  Head: Normocephalic and atraumatic.  Eyes: Conjunctivae and EOM are normal. Pupils are equal, round, and reactive to light.  Pulmonary/Chest: Effort normal and breath sounds normal. She exhibits no  tenderness.  Abdominal: Soft. Bowel sounds are normal.  Musculoskeletal: Normal range of motion. She exhibits no edema.  Neurological: She is alert and oriented to person, place, and time.  Skin: Skin is warm and dry.  Psychiatric: She has a normal mood and affect.    ED Course  Procedures (including critical care time)  Labs Reviewed  CBC - Abnormal; Notable for the following:    WBC 3.0 (*)    All other components within normal limits  BASIC METABOLIC PANEL  TROPONIN I   Dg Chest 2 View  01/03/2012  *RADIOLOGY REPORT*  Clinical Data: Shortness of breath, hypertension  CHEST - 2 VIEW  Comparison:  08/09/2008  Findings:  The heart size and mediastinal contours are within normal limits.  Both lungs are clear.  The visualized skeletal structures are unremarkable.  IMPRESSION: No active cardiopulmonary disease.  Original  Report Authenticated By: Judie Petit. Ruel Favors, M.D.    Date: 01/03/2012  Rate: 86  Rhythm: normal sinus rhythm  QRS Axis: normal  Intervals: normal  ST/T Wave abnormalities: nonspecific ST changes  Conduction Disutrbances:none  Narrative Interpretation:   Old EKG Reviewed: unchanged    1. HTN (hypertension)   2. SOB (shortness of breath)       MDM  Called pharmacy to get dosage of metropolol:pt is asymptomatic hear:doubt acs:Pt is okay to follow up with pcp        Teressa Lower, NP 01/03/12 1437

## 2012-01-03 NOTE — ED Provider Notes (Signed)
Medical screening examination/treatment/procedure(s) were performed by non-physician practitioner and as supervising physician I was immediately available for consultation/collaboration.   Aariyah Sampey, MD 01/03/12 1625 

## 2012-01-03 NOTE — ED Notes (Signed)
Patient states she has had elevated blood pressure for the last several weeks.  States she was seen at her PCP office today by the nurse and was told her bp was 160/100.  States she feels light headed too.

## 2012-01-03 NOTE — Discharge Instructions (Signed)
Hypertension Information As your heart beats, it forces blood through your arteries. This force is your blood pressure. If the pressure is too high, it is called hypertension (HTN) or high blood pressure. HTN is dangerous because you may have it and not know it. High blood pressure may mean that your heart has to work harder to pump blood. Your arteries may be narrow or stiff. The extra work puts you at risk for heart disease, stroke, and other problems.  Blood pressure consists of two numbers, a higher number over a lower, 110/72, for example. It is stated as "110 over 72." The ideal is below 120 for the top number (systolic) and under 80 for the bottom (diastolic).  You should pay close attention to your blood pressure if you have certain conditions such as:  Heart failure.   Prior heart attack.   Diabetes   Chronic kidney disease.   Prior stroke.   Multiple risk factors for heart disease.  To see if you have HTN, your blood pressure should be measured while you are seated with your arm held at the level of the heart. It should be measured at least twice. A one-time elevated blood pressure reading (especially in the Emergency Department) does not mean that you need treatment. There may be conditions in which the blood pressure is different between your right and left arms. It is important to see your caregiver soon for a recheck. Most people have essential hypertension which means that there is not a specific cause. This type of high blood pressure may be lowered by changing lifestyle factors such as:  Stress.   Smoking.   Lack of exercise.   Excessive weight.   Drug/tobacco/alcohol use.   Eating less salt.  Most people do not have symptoms from high blood pressure until it has caused damage to the body. Effective treatment can often prevent, delay or reduce that damage. TREATMENT  Treatment for high blood pressure, when a cause has been identified, is directed at the cause. There  are a large number of medications to treat HTN. These fall into several categories, and your caregiver will help you select the medicines that are best for you. Medications may have side effects. You should review side effects with your caregiver. If your blood pressure stays high after you have made lifestyle changes or started on medicines,   Your medication(s) may need to be changed.   Other problems may need to be addressed.   Be certain you understand your prescriptions, and know how and when to take your medicine.   Be sure to follow up with your caregiver within the time frame advised (usually within two weeks) to have your blood pressure rechecked and to review your medications.   If you are taking more than one medicine to lower your blood pressure, make sure you know how and at what times they should be taken. Taking two medicines at the same time can result in blood pressure that is too low.  Document Released: 11/20/2005 Document Revised: 05/30/2011 Document Reviewed: 11/27/2007 [employer] Patient Information 2012 [employer]. 

## 2014-08-02 ENCOUNTER — Encounter (HOSPITAL_BASED_OUTPATIENT_CLINIC_OR_DEPARTMENT_OTHER): Payer: Self-pay | Admitting: *Deleted

## 2014-10-20 ENCOUNTER — Emergency Department (HOSPITAL_BASED_OUTPATIENT_CLINIC_OR_DEPARTMENT_OTHER)
Admission: EM | Admit: 2014-10-20 | Discharge: 2014-10-20 | Disposition: A | Discharge status: Home / Self Care | Payer: BLUE CROSS/BLUE SHIELD | Attending: Emergency Medicine | Admitting: Emergency Medicine

## 2014-10-20 ENCOUNTER — Encounter (HOSPITAL_BASED_OUTPATIENT_CLINIC_OR_DEPARTMENT_OTHER): Payer: Self-pay

## 2014-10-20 DIAGNOSIS — Z79899 Other long term (current) drug therapy: Secondary | ICD-10-CM | POA: Insufficient documentation

## 2014-10-20 DIAGNOSIS — I10 Essential (primary) hypertension: Secondary | ICD-10-CM | POA: Insufficient documentation

## 2014-10-20 DIAGNOSIS — Z7982 Long term (current) use of aspirin: Secondary | ICD-10-CM | POA: Diagnosis not present

## 2014-10-20 DIAGNOSIS — Z8639 Personal history of other endocrine, nutritional and metabolic disease: Secondary | ICD-10-CM | POA: Diagnosis not present

## 2014-10-20 DIAGNOSIS — H9202 Otalgia, left ear: Secondary | ICD-10-CM | POA: Diagnosis present

## 2014-10-20 DIAGNOSIS — H6122 Impacted cerumen, left ear: Secondary | ICD-10-CM | POA: Diagnosis not present

## 2014-10-20 HISTORY — DX: Pure hypercholesterolemia, unspecified: E78.00

## 2014-10-20 MED ORDER — CARBAMIDE PEROXIDE 6.5 % OT SOLN: 5.0000 [drp] | Freq: Two times a day (BID) | OTIC | Status: DC

## 2014-10-20 NOTE — ED Notes (Signed)
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

## 2014-10-20 NOTE — Discharge Instructions (Signed)
Cerumen Impaction °A cerumen impaction is when the wax in your ear forms a plug. This plug usually causes reduced hearing. Sometimes it also causes an earache or dizziness. Removing a cerumen impaction can be difficult and painful. The wax sticks to the ear canal. The canal is sensitive and bleeds easily. If you try to remove a heavy wax buildup with a cotton tipped swab, you may push it in further. °Irrigation with water, suction, and small ear curettes may be used to clear out the wax. If the impaction is fixed to the skin in the ear canal, ear drops may be needed for a few days to loosen the wax. People who build up a lot of wax frequently can use ear wax removal products available in your local drugstore. °SEEK MEDICAL CARE IF:  °You develop an earache, increased hearing loss, or marked dizziness. °Document Released: 10/25/2004 Document Revised: 12/10/2011 Document Reviewed: 12/15/2009 °ExitCare® Patient Information ©2015 [employer]. This information is not intended to replace advice given to you by your health care provider. Make sure you discuss any questions you have with your health care provider. ° °

## 2014-10-20 NOTE — ED Notes (Signed)
PA at bedside.

## 2014-10-20 NOTE — ED Provider Notes (Signed)
CSN: [id number]     Arrival date & time 10/20/14  48 History   First MD Initiated Contact with Patient 10/20/14 1333     Chief Complaint  Patient presents with  . Otalgia     (Consider location/radiation/quality/duration/timing/severity/associated sxs/prior Treatment) HPI   Patient presents to the Er with complaints of ear fullness to ear for the past week since having a bilateral cerumen disimpaction done. 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 She has not had any pain or drainage. She has not had any nasal congestion, cough or sore throat. She has not had any headaches. She denies having hx of difficulty equalizing her pressure while in the air. Pt has PMH of hypertension and high cholesterol but is otherwise healthy.   Past Medical History  Diagnosis Date  . Hypertension   . High cholesterol    Past Surgical History  Procedure Laterality Date  . Breast lumpectomy     Family History  Problem Relation Age of Onset  . Diabetes Father   . Hypertension Father   . Diabetes Mother   . Hypertension Mother   . Breast cancer Sister 39   History  Substance Use Topics  . Smoking status: Never Smoker   . Smokeless tobacco: Not on file  . Alcohol Use: No   OB History    Gravida Para Term Preterm AB TAB SAB Ectopic Multiple Living   3         3     Review of Systems  10 Systems reviewed and are negative for acute change except as noted in the HPI.     Allergies  Review of patient's allergies indicates no known allergies.  Home Medications   Prior to Admission medications   Medication Sig Start Date End Date Taking? Authorizing Provider  HYDRALAZINE-HCTZ PO Take by mouth.   Yes Historical Provider, MD  aspirin 81 MG tablet Take 160 mg by mouth daily.      Historical Provider, MD  Aspirin-Caffeine (BAYER BACK & BODY PAIN EX ST) 500-32.5 MG TABS Take 2 tablets by mouth daily as needed. pain      Historical Provider, MD  carbamide peroxide (DEBROX) 6.5 % otic solution Place 5 drops into both ears 2 (two) times daily. 10/20/14   Linus Mako, PA-C  cholecalciferol (VITAMIN D) 1000 UNITS tablet Take 1,000 Units by mouth 2 (two) times daily.      Historical Provider, MD  Cholecalciferol (VITAMIN D3) 1000 UNITS CAPS Take 1 capsule by mouth daily.      Historical Provider, MD  EVENING PRIMROSE OIL PO Take 1 capsule by mouth daily.      Historical Provider, MD  metoprolol (LOPRESSOR) 50 MG tablet Take 50 mg by mouth 2 (two) times daily.      Historical Provider, MD  metoprolol (LOPRESSOR) 50 MG tablet Take 1 tablet (50 mg total) by mouth 2 (two) times daily. 01/03/12 01/02/13  Glendell Docker, NP  metoprolol (TOPROL-XL) 50 MG 24 hr tablet Take 50 mg by mouth 2 (two) times daily.      Historical Provider, MD  Tetrahydrozoline HCl (VISINE OP) Place 1 drop into both eyes daily.      Historical Provider, MD   BP 133/86 mmHg  Pulse 58  Temp(Src) 99.2 F (37.3 C) (Oral)  Resp 16  Ht 5' 6.5" (1.689 m)  Wt 157 lb (71.215 kg)  BMI 24.96 kg/m2  SpO2 100%  LMP 12/03/2011 Physical Exam  Constitutional: She appears well-developed and well-nourished. No distress.  HENT:  Head: Normocephalic and atraumatic.  Right Ear: Tympanic membrane and ear canal normal.  Left Ear: Tympanic membrane and ear canal normal.  Nose: Nose normal. Right sinus exhibits no maxillary sinus tenderness and no frontal sinus tenderness. Left sinus exhibits no maxillary sinus tenderness and no frontal sinus tenderness.  Eyes: Pupils are equal, round, and reactive to light.  Neck: Normal range of motion. Neck supple.  Cardiovascular: Normal rate and regular rhythm.   Pulmonary/Chest: Effort normal.  Abdominal: Soft.  Neurological: She is alert.  Skin: Skin is warm and dry.  Nursing note and vitals reviewed.   ED Course  Procedures (including critical care time) Labs Review Labs Reviewed - No data to display  Imaging  Review No results found.   EKG Interpretation None      MDM   Final diagnoses:  Cerumen impaction, left    Normal physical exam. Pt has appointment with ENT in 1 week, encouraged to follow-up. Started on Debrox and recommended she carry Afrin in carry on for work to use in emergency situations.  59 y.o.Emily Ross's evaluation in the Emergency Department is complete. It has been determined that no acute conditions requiring further emergency intervention are present at this time. The patient/guardian have been advised of the diagnosis and plan. We have discussed signs and symptoms that warrant return to the ED, such as changes or worsening in symptoms.  Vital signs are stable at discharge. Filed Vitals:   10/20/14 1241  BP: 133/86  Pulse: 58  Temp: 99.2 F (37.3 C)  Resp: 16    Patient/guardian has voiced understanding and agreed to follow-up with the PCP or specialist.     Linus Mako, PA-C 10/20/14 [place], MD 10/20/14 1501

## 2015-04-22 ENCOUNTER — Encounter: Payer: Self-pay | Admitting: Internal Medicine

## 2015-07-20 ENCOUNTER — Encounter: Payer: Self-pay | Admitting: Gastroenterology

## 2016-03-07 ENCOUNTER — Other Ambulatory Visit: Payer: Self-pay | Admitting: Obstetrics and Gynecology

## 2017-08-06 ENCOUNTER — Encounter (HOSPITAL_COMMUNITY): Payer: Self-pay

## 2017-12-26 LAB — HM MAMMOGRAPHY: HM Mammogram: NORMAL (ref 0–4)

## 2018-04-07 ENCOUNTER — Other Ambulatory Visit (HOSPITAL_COMMUNITY)
Admission: RE | Admit: 2018-04-07 | Discharge: 2018-04-07 | Disposition: A | Discharge status: Home / Self Care | Payer: 59 | Source: Ambulatory Visit | Attending: Obstetrics and Gynecology | Admitting: Obstetrics and Gynecology

## 2018-04-07 ENCOUNTER — Other Ambulatory Visit: Payer: Self-pay | Admitting: Obstetrics and Gynecology

## 2018-04-07 DIAGNOSIS — Z01411 Encounter for gynecological examination (general) (routine) with abnormal findings: Secondary | ICD-10-CM | POA: Diagnosis not present

## 2018-04-07 DIAGNOSIS — Z01419 Encounter for gynecological examination (general) (routine) without abnormal findings: Secondary | ICD-10-CM | POA: Diagnosis not present

## 2018-04-07 DIAGNOSIS — N950 Postmenopausal bleeding: Secondary | ICD-10-CM | POA: Diagnosis not present

## 2018-04-09 DIAGNOSIS — H6123 Impacted cerumen, bilateral: Secondary | ICD-10-CM | POA: Diagnosis not present

## 2018-04-09 DIAGNOSIS — I10 Essential (primary) hypertension: Secondary | ICD-10-CM | POA: Diagnosis not present

## 2018-04-09 DIAGNOSIS — Z0000 Encounter for general adult medical examination without abnormal findings: Secondary | ICD-10-CM | POA: Diagnosis not present

## 2018-04-09 DIAGNOSIS — E782 Mixed hyperlipidemia: Secondary | ICD-10-CM | POA: Diagnosis not present

## 2018-04-09 DIAGNOSIS — R7303 Prediabetes: Secondary | ICD-10-CM | POA: Insufficient documentation

## 2018-04-09 DIAGNOSIS — E559 Vitamin D deficiency, unspecified: Secondary | ICD-10-CM | POA: Diagnosis not present

## 2018-04-17 LAB — CYTOLOGY - PAP
Diagnosis: NEGATIVE
HPV 16/18/45 genotyping: NEGATIVE
HPV: DETECTED — AB

## 2018-04-21 DIAGNOSIS — N950 Postmenopausal bleeding: Secondary | ICD-10-CM | POA: Diagnosis not present

## 2018-05-20 ENCOUNTER — Encounter: Payer: Self-pay | Admitting: Gastroenterology

## 2018-07-04 ENCOUNTER — Other Ambulatory Visit: Payer: Self-pay | Admitting: Nurse Practitioner

## 2018-07-18 ENCOUNTER — Encounter: Payer: BLUE CROSS/BLUE SHIELD | Admitting: Gastroenterology

## 2018-07-28 ENCOUNTER — Encounter: Payer: Self-pay | Admitting: Gastroenterology

## 2018-09-30 ENCOUNTER — Other Ambulatory Visit: Payer: Self-pay | Admitting: Nurse Practitioner

## 2018-10-10 ENCOUNTER — Ambulatory Visit: Payer: Self-pay | Admitting: Nurse Practitioner

## 2018-10-23 ENCOUNTER — Ambulatory Visit: Payer: Self-pay | Admitting: Nurse Practitioner

## 2018-10-28 ENCOUNTER — Encounter: Payer: Self-pay | Admitting: Internal Medicine

## 2018-10-28 ENCOUNTER — Ambulatory Visit: Payer: 59 | Admitting: Internal Medicine

## 2018-10-28 ENCOUNTER — Other Ambulatory Visit: Payer: Self-pay

## 2018-10-28 ENCOUNTER — Other Ambulatory Visit: Payer: Self-pay | Admitting: Internal Medicine

## 2018-10-28 VITALS — BP 124/80 | HR 86 | Temp 98.4°F | Ht 64.4 in | Wt 161.8 lb

## 2018-10-28 DIAGNOSIS — I10 Essential (primary) hypertension: Secondary | ICD-10-CM

## 2018-10-28 DIAGNOSIS — E7800 Pure hypercholesterolemia, unspecified: Secondary | ICD-10-CM | POA: Diagnosis not present

## 2018-10-28 DIAGNOSIS — R7303 Prediabetes: Secondary | ICD-10-CM

## 2018-10-28 MED ORDER — METOPROLOL TARTRATE 50 MG PO TABS: 50.0000 mg | ORAL_TABLET | Freq: Two times a day (BID) | ORAL | 1 refills | Status: DC

## 2018-10-28 MED ORDER — SIMVASTATIN 10 MG PO TABS: 10.0000 mg | ORAL_TABLET | Freq: Every evening | ORAL | 1 refills | Status: DC

## 2018-10-28 MED ORDER — HYDROCHLOROTHIAZIDE 25 MG PO TABS: 25.0000 mg | ORAL_TABLET | Freq: Every day | ORAL | 1 refills | Status: DC

## 2018-10-28 NOTE — Progress Notes (Unsigned)
Please request her mammogram from last year from [facility], the last one in [place] is 11/2016.

## 2018-10-28 NOTE — Progress Notes (Signed)
Subjective:     Patient ID: Emily Ross , female    DOB: 1955-10-17 , 63 y.o.   MRN: [id number]   Chief Complaint  Patient presents with  . Hypertension    HPI  BP's 119-30/ 70-80 Walks 1 mile a day 3-4 days per week. Sometimes does eliptical.   Past Medical History:  Diagnosis Date  . High cholesterol   . Hypertension      Family History  Problem Relation Age of Onset  . Diabetes Father   . Hypertension Father   . Diabetes Mother   . Hypertension Mother   . Breast cancer Sister 72     Current Outpatient Medications:  .  cholecalciferol (VITAMIN D) 1000 UNITS tablet, Take 1,000 Units by mouth 2 (two) times daily.  , Disp: , Rfl:  .  hydrochlorothiazide (HYDRODIURIL) 25 MG tablet, TAKE ONE TABLET BY MOUTH ONE TIME DAILY , Disp: 90 tablet, Rfl: 1 .  metoprolol (LOPRESSOR) 50 MG tablet, Take 50 mg by mouth 2 (two) times daily.  , Disp: , Rfl:  .  simvastatin (ZOCOR) 10 MG tablet, TAKE ONE TABLET BY MOUTH DAILY IN THE EVENING , Disp: 90 tablet, Rfl: 0 .  aspirin 81 MG tablet, Take 160 mg by mouth daily.  , Disp: , Rfl:  .  Aspirin-Caffeine (BAYER BACK & BODY PAIN EX ST) 500-32.5 MG TABS, Take 2 tablets by mouth daily as needed. pain , Disp: , Rfl:  .  carbamide peroxide (DEBROX) 6.5 % otic solution, Place 5 drops into both ears 2 (two) times daily. (Patient not taking: Reported on 10/28/2018), Disp: 15 mL, Rfl: 0 .  metoprolol (LOPRESSOR) 50 MG tablet, Take 1 tablet (50 mg total) by mouth 2 (two) times daily., Disp: 60 tablet, Rfl: 0   No Known Allergies   Review of Systems  Constitutional: Positive for diaphoresis.       From hot flushes, but not worse than usual  HENT: Negative for tinnitus.   Eyes: Negative for visual disturbance.  Respiratory: Negative for chest tightness and shortness of breath.   Cardiovascular: Negative for chest pain, palpitations and leg swelling.  Gastrointestinal: Negative for constipation and diarrhea.  Endocrine: Negative for cold  intolerance, heat intolerance, polydipsia and polyphagia.  Genitourinary: Negative for difficulty urinating.  Neurological: Negative for headaches.     Today's Vitals   10/28/18 0909  BP: 124/80  Pulse: 86  Temp: 98.4 F (36.9 C)  TempSrc: Oral  SpO2: 96%  Weight: 161 lb 12.8 oz (73.4 kg)  Height: 5' 4.4" (1.636 m)  PainSc: 0-No pain   Body mass index is 27.43 kg/m.   Objective:  Physical Exam   Constitutional: She is oriented to person, place, and time. She appears well-developed and well-nourished. No distress.  HENT:  Head: Normocephalic and atraumatic.  Right Ear: External ear normal.  Left Ear: External ear normal.  Nose: Nose normal.  Eyes: Conjunctivae are normal. Right eye exhibits no discharge. Left eye exhibits no discharge. No scleral icterus.  Neck: Neck supple. No thyromegaly present.  No carotid bruits bilaterally  Cardiovascular: Normal rate and regular rhythm.  No murmur heard. Pulmonary/Chest: Effort normal and breath sounds normal. No respiratory distress.  Musculoskeletal: Normal range of motion. She exhibits no edema.  Lymphadenopathy:    She has no cervical adenopathy.  Neurological: She is alert and oriented to person, place, and time.  Skin: Skin is warm and dry. Capillary refill takes less than 2 seconds. No rash noted. She is  not diaphoretic.  Psychiatric: She has a normal mood and affect. Her behavior is normal. Judgment and thought content normal.  Nursing note reviewed.     Assessment And Plan:   1. Essential hypertension- stable. May continue same meds.  - CMP14 + Anion Gap - CBC no Diff  2. Hypercalcemia- chronic. Has not had PTH done in the past.  - Parathyroid Hormone, Intact w/Ca  3. Elevated LDL cholesterol level- chronic - TSH - T4, Free - T3, free  4. Prediabetes- unknown status. - Hemoglobin A1c   FU with Janece in 6 months.    Decari Duggar RODRIGUEZ-SOUTHWORTH, PA-C

## 2018-10-28 NOTE — Patient Instructions (Signed)

## 2018-10-29 LAB — CMP14 + ANION GAP
ALT: 12 IU/L (ref 0–32)
AST: 15 IU/L (ref 0–40)
Albumin/Globulin Ratio: 1.8 (ref 1.2–2.2)
Albumin: 4.6 g/dL (ref 3.8–4.8)
Alkaline Phosphatase: 66 IU/L (ref 39–117)
Anion Gap: 15 mmol/L (ref 10.0–18.0)
BUN/Creatinine Ratio: 16 (ref 12–28)
BUN: 13 mg/dL (ref 8–27)
Bilirubin Total: 0.4 mg/dL (ref 0.0–1.2)
CO2: 27 mmol/L (ref 20–29)
Calcium: 10.3 mg/dL (ref 8.7–10.3)
Chloride: 99 mmol/L (ref 96–106)
Creatinine, Ser: 0.83 mg/dL (ref 0.57–1.00)
GFR calc Af Amer: 87 mL/min/{1.73_m2} (ref >59)
GFR calc non Af Amer: 75 mL/min/{1.73_m2} (ref >59)
Globulin, Total: 2.5 g/dL (ref 1.5–4.5)
Glucose: 91 mg/dL (ref 65–99)
Potassium: 4.3 mmol/L (ref 3.5–5.2)
Sodium: 141 mmol/L (ref 134–144)
Total Protein: 7.1 g/dL (ref 6.0–8.5)

## 2018-10-29 LAB — PTH, INTACT AND CALCIUM: PTH: 48 pg/mL (ref 15–65)

## 2018-10-29 LAB — T4, FREE: Free T4: 1.25 ng/dL (ref 0.82–1.77)

## 2018-10-29 LAB — TSH: TSH: 1.38 u[IU]/mL (ref 0.450–4.500)

## 2018-10-29 LAB — CBC
Hematocrit: 36.5 % (ref 34.0–46.6)
Hemoglobin: 11.9 g/dL (ref 11.1–15.9)
MCH: 29.6 pg (ref 26.6–33.0)
MCHC: 32.6 g/dL (ref 31.5–35.7)
MCV: 91 fL (ref 79–97)
Platelets: 282 10*3/uL (ref 150–450)
RBC: 4.02 x10E6/uL (ref 3.77–5.28)
RDW: 12 % (ref 11.7–15.4)
WBC: 3.4 10*3/uL (ref 3.4–10.8)

## 2018-10-29 LAB — T3, FREE: T3, Free: 3.6 pg/mL (ref 2.0–4.4)

## 2018-11-12 ENCOUNTER — Encounter: Payer: Self-pay | Admitting: Internal Medicine

## 2018-11-12 ENCOUNTER — Ambulatory Visit: Payer: 59 | Admitting: Internal Medicine

## 2018-11-12 ENCOUNTER — Other Ambulatory Visit: Payer: Self-pay

## 2018-11-12 VITALS — BP 132/72 | HR 73 | Temp 98.3°F | Ht 64.4 in | Wt 161.0 lb

## 2018-11-12 DIAGNOSIS — R05 Cough: Secondary | ICD-10-CM | POA: Diagnosis not present

## 2018-11-12 DIAGNOSIS — R059 Cough, unspecified: Secondary | ICD-10-CM

## 2018-11-12 MED ORDER — GUAIFENESIN-CODEINE 100-10 MG/5ML PO SOLN: 10.0000 mL | Freq: Every evening | ORAL | 0 refills | Status: DC | PRN

## 2018-11-12 NOTE — Progress Notes (Signed)
Subjective:     Patient ID: Emily Ross , female    DOB: 31-Aug-1956 , 63 y.o.   MRN: [id number]   Chief Complaint  Patient presents with  . Cough    at night mostly/all other symptoms have subsided/ thick mucus    HPI  Had ST one week ago, with HA and body aches, with mild and cough. Today she feels better. Had hoarseness x 3 days last week. The cough keeps her up at night time. Has been taking Nyquil qhs which is not helping night time cough. No fever. Has not done any saline rinses. Cough at night time is with thick slightly green mucous. During the day is non productive.   Past Medical History:  Diagnosis Date  . High cholesterol   . Hypertension      Family History  Problem Relation Age of Onset  . Diabetes Father   . Hypertension Father   . Diabetes Mother   . Hypertension Mother   . Breast cancer Sister 72     Current Outpatient Medications:  .  cholecalciferol (VITAMIN D) 1000 UNITS tablet, Take 1,000 Units by mouth 2 (two) times daily.  , Disp: , Rfl:  .  hydrochlorothiazide (HYDRODIURIL) 25 MG tablet, Take 1 tablet (25 mg total) by mouth daily., Disp: 90 tablet, Rfl: 1 .  metoprolol tartrate (LOPRESSOR) 50 MG tablet, Take 1 tablet (50 mg total) by mouth 2 (two) times daily., Disp: 90 tablet, Rfl: 1 .  simvastatin (ZOCOR) 10 MG tablet, Take 1 tablet (10 mg total) by mouth every evening., Disp: 90 tablet, Rfl: 1 .  aspirin 81 MG tablet, Take 160 mg by mouth daily.  , Disp: , Rfl:  .  Aspirin-Caffeine (BAYER BACK & BODY PAIN EX ST) 500-32.5 MG TABS, Take 2 tablets by mouth daily as needed. pain , Disp: , Rfl:  .  carbamide peroxide (DEBROX) 6.5 % otic solution, Place 5 drops into both ears 2 (two) times daily. (Patient not taking: Reported on 10/28/2018), Disp: 15 mL, Rfl: 0 .  metoprolol (LOPRESSOR) 50 MG tablet, Take 1 tablet (50 mg total) by mouth 2 (two) times daily., Disp: 60 tablet, Rfl: 0   No Known Allergies   Review of Systems  Constitutional: Negative  for chills, diaphoresis and fever.  HENT: Positive for postnasal drip and voice change. Negative for congestion, ear discharge, ear pain, rhinorrhea, sinus pressure, sinus pain, sore throat and trouble swallowing.   Eyes: Negative for discharge.  Respiratory: Positive for cough. Negative for shortness of breath and wheezing.   Cardiovascular: Negative for chest pain.  Musculoskeletal: Negative for myalgias.  Skin: Negative for rash.  Neurological: Negative for dizziness and headaches.     Today's Vitals   11/12/18 1112  BP: 132/72  Pulse: 73  Temp: 98.3 F (36.8 C)  TempSrc: Oral  SpO2: 98%  Weight: 161 lb (73 kg)  Height: 5' 4.4" (1.636 m)   Body mass index is 27.29 kg/m.   Objective:   Physical Exam Vitals signs and nursing note reviewed.  Constitutional:      General: She is not in acute distress.    Appearance: Normal appearance. She is not toxic-appearing.  HENT:     Head: Normocephalic.     Right Ear: Tympanic membrane, ear canal and external ear normal.     Left Ear: Tympanic membrane, ear canal and external ear normal.     Nose: Nose normal.     Mouth/Throat:     Mouth: Mucous  membranes are moist.     Pharynx: No oropharyngeal exudate or posterior oropharyngeal erythema.  Eyes:     General: No scleral icterus.       Right eye: No discharge.        Left eye: No discharge.     Conjunctiva/sclera: Conjunctivae normal.  Neck:     Musculoskeletal: Neck supple. No neck rigidity.  Cardiovascular:     Rate and Rhythm: Normal rate and regular rhythm.     Heart sounds: No murmur.  Pulmonary:     Effort: Pulmonary effort is normal.     Breath sounds: Normal breath sounds. No wheezing or rhonchi.  Musculoskeletal: Normal range of motion.  Lymphadenopathy:     Cervical: No cervical adenopathy.  Skin:    General: Skin is warm and dry.     Coloration: Skin is not jaundiced.     Findings: No rash.  Neurological:     Mental Status: She is alert and oriented to  person, place, and time.     Gait: Gait normal.  Psychiatric:        Mood and Affect: Mood normal.        Behavior: Behavior normal.        Thought Content: Thought content normal.        Judgment: Judgment normal.    Assessment And Plan:   1. Cough- night time cough more likely from PND. She was shown a video how to do saline nose rinses and was recommended to do this bid for 3-5 days. I also gave her Rx for [facility] for night time. If she gets fever, chills, wheezing we need to see her back.       Jullian Clayson RODRIGUEZ-SOUTHWORTH, PA-C

## 2018-11-12 NOTE — Patient Instructions (Addendum)
GET A SALINE NOSE KIT CALLED NETIE POT OR NEIL MED SQUEEZE BOTTLE AND USE IT TWICE A DAY FOR 3-5 DAYS. DO NOT DO IT AT BED TIME OR USE TAP WATER. Upper Respiratory Infection, Adult An upper respiratory infection (URI) affects the nose, throat, and upper air passages. URIs are caused by germs (viruses). The most common type of URI is often called "the common cold." Medicines cannot cure URIs, but you can do things at home to relieve your symptoms. URIs usually get better within 7-10 days. Follow these instructions at home: Activity  Rest as needed.  If you have a fever, stay home from work or school until your fever is gone, or until your doctor says you may return to work or school. ? You should stay home until you cannot spread the infection anymore (you are not contagious). ? Your doctor may have you wear a face mask so you have less risk of spreading the infection. Relieving symptoms  Gargle with a salt-water mixture 3-4 times a day or as needed. To make a salt-water mixture, completely dissolve -1 tsp of salt in 1 cup of warm water.  Use a cool-mist humidifier to add moisture to the air. This can help you breathe more easily. Eating and drinking   Drink enough fluid to keep your pee (urine) pale yellow.  Eat soups and other clear broths. General instructions   Take over-the-counter and prescription medicines only as told by your doctor. These include cold medicines, fever reducers, and cough suppressants.  Do not use any products that contain nicotine or tobacco. These include cigarettes and e-cigarettes. If you need help quitting, ask your doctor.  Avoid being where people are smoking (avoid secondhand smoke).  Make sure you get regular shots and get the flu shot every year.  Keep all follow-up visits as told by your doctor. This is important. How to avoid spreading infection to others   Wash your hands often with soap and water. If you do not have soap and water, use hand  sanitizer.  Avoid touching your mouth, face, eyes, or nose.  Cough or sneeze into a tissue or your sleeve or elbow. Do not cough or sneeze into your hand or into the air. Contact a doctor if:  You are getting worse, not better.  You have any of these: ? A fever. ? Chills. ? Brown or red mucus in your nose. ? Yellow or brown fluid (discharge)coming from your nose. ? Pain in your face, especially when you bend forward. ? Swollen neck glands. ? Pain with swallowing. ? White areas in the back of your throat. Get help right away if:  You have shortness of breath that gets worse.  You have very bad or constant: ? Headache. ? Ear pain. ? Pain in your forehead, behind your eyes, and over your cheekbones (sinus pain). ? Chest pain.  You have long-lasting (chronic) lung disease along with any of these: ? Wheezing. ? Long-lasting cough. ? Coughing up blood. ? A change in your usual mucus.  You have a stiff neck.  You have changes in your: ? Vision. ? Hearing. ? Thinking. ? Mood. Summary  An upper respiratory infection (URI) is caused by a germ called a virus. The most common type of URI is often called "the common cold."  URIs usually get better within 7-10 days.  Take over-the-counter and prescription medicines only as told by your doctor. This information is not intended to replace advice given to you by your  health care provider. Make sure you discuss any questions you have with your health care provider. Document Released: 03/05/2008 Document Revised: 05/10/2017 Document Reviewed: 05/10/2017 Elsevier Interactive Patient Education  2019 [employer].

## 2018-11-13 ENCOUNTER — Other Ambulatory Visit: Payer: Self-pay | Admitting: Internal Medicine

## 2018-11-13 MED ORDER — PROMETHAZINE-PHENYLEPH-CODEINE 6.25-5-10 MG/5ML PO SYRP: ORAL_SOLUTION | ORAL | 0 refills | Status: DC

## 2018-11-13 NOTE — Progress Notes (Signed)
Pt stopped by to tell me the [facility] coused her cough to produce more mucous, but did not help her rest. She recalled in the past having promethazine with codeine which helped her then. Would like to try this.

## 2018-11-18 ENCOUNTER — Encounter: Payer: Self-pay | Admitting: Nurse Practitioner

## 2018-11-19 ENCOUNTER — Ambulatory Visit (AMBULATORY_SURGERY_CENTER): Payer: Self-pay

## 2018-11-19 ENCOUNTER — Encounter: Payer: Self-pay | Admitting: Gastroenterology

## 2018-11-19 VITALS — Ht 66.0 in | Wt 162.4 lb

## 2018-11-19 DIAGNOSIS — Z1211 Encounter for screening for malignant neoplasm of colon: Secondary | ICD-10-CM

## 2018-11-19 MED ORDER — NA SULFATE-K SULFATE-MG SULF 17.5-3.13-1.6 GM/177ML PO SOLN: 1.0000 | Freq: Once | ORAL | 0 refills | Status: AC

## 2018-11-19 NOTE — Progress Notes (Signed)
No egg or soy allergy known to patient  No issues with past sedation with any surgeries  or procedures, no intubation problems  No diet pills per patient No home 02 use per patient  No blood thinners per patient  Pt denies issues with constipation  No A fib or A flutter  EMMI video sent to pt's e mail , pt declined    

## 2018-12-03 ENCOUNTER — Encounter: Payer: Self-pay | Admitting: Gastroenterology

## 2018-12-03 ENCOUNTER — Ambulatory Visit (AMBULATORY_SURGERY_CENTER): Payer: 59 | Admitting: Gastroenterology

## 2018-12-03 ENCOUNTER — Other Ambulatory Visit: Payer: Self-pay

## 2018-12-03 VITALS — BP 108/62 | HR 69 | Temp 97.5°F | Resp 13

## 2018-12-03 DIAGNOSIS — D122 Benign neoplasm of ascending colon: Secondary | ICD-10-CM | POA: Diagnosis not present

## 2018-12-03 DIAGNOSIS — Z1211 Encounter for screening for malignant neoplasm of colon: Secondary | ICD-10-CM | POA: Diagnosis present

## 2018-12-03 MED ORDER — SODIUM CHLORIDE 0.9 % IV SOLN: 500.0000 mL | Freq: Once | INTRAVENOUS | Status: DC

## 2018-12-03 NOTE — Progress Notes (Signed)
Pt's states no medical or surgical changes since previsit or office visit. 

## 2018-12-03 NOTE — Progress Notes (Signed)
PT taken to PACU. Monitors in place. VSS. Report given to RN. 

## 2018-12-03 NOTE — Progress Notes (Signed)
Called to room to assist during endoscopic procedure.  Patient ID and intended procedure confirmed with present staff. Received instructions for my participation in the procedure from the performing physician.  

## 2018-12-03 NOTE — Op Note (Signed)
[facility] Patient Name: Emily Ross Procedure Date: 12/03/2018 11:23 AM MRN: [id number] Endoscopist: Remo Lipps P. Havery Moros , MD Age: 63 Referring MD:  Date of Birth: July 24, 1956 Gender: Female Account #: [id number] Procedure:                Colonoscopy Indications:              Screening for colorectal malignant neoplasm Medicines:                Monitored Anesthesia Care Procedure:                Pre-Anesthesia Assessment:                           - Prior to the procedure, a History and Physical                            was performed, and patient medications and                            allergies were reviewed. The patient's tolerance of                            previous anesthesia was also reviewed. The risks                            and benefits of the procedure and the sedation                            options and risks were discussed with the patient.                            All questions were answered, and informed consent                            was obtained. Prior Anticoagulants: The patient has                            taken no previous anticoagulant or antiplatelet                            agents. ASA Grade Assessment: II - A patient with                            mild systemic disease. After reviewing the risks                            and benefits, the patient was deemed in                            satisfactory condition to undergo the procedure.                           After obtaining informed consent, the colonoscope  was passed under direct vision. Throughout the                            procedure, the patient's blood pressure, pulse, and                            oxygen saturations were monitored continuously. The                            Colonoscope was introduced through the anus and                            advanced to the the cecum, identified by                            appendiceal orifice  and ileocecal valve. The                            colonoscopy was performed without difficulty. The                            patient tolerated the procedure well. The quality                            of the bowel preparation was good. The ileocecal                            valve, appendiceal orifice, and rectum were                            photographed. Scope In: 11:27:51 AM Scope Out: 11:42:32 AM Scope Withdrawal Time: 0 hours 9 minutes 58 seconds  Total Procedure Duration: 0 hours 14 minutes 41 seconds  Findings:                 The perianal and digital rectal examinations were                            normal.                           A diminutive 1-80mm polyp was found in the ascending                            colon. The polyp was sessile. The polyp was removed                            with a cold biopsy forceps. Resection and retrieval                            were complete.                           Internal hemorrhoids were found during  retroflexion. The hemorrhoids were small.                           The exam was otherwise without abnormality. Complications:            No immediate complications. Estimated blood loss:                            Minimal. Estimated Blood Loss:     Estimated blood loss was minimal. Impression:               - One diminutive polyp in the ascending colon,                            removed with a cold biopsy forceps. Resected and                            retrieved.                           - Internal hemorrhoids.                           - The examination was otherwise normal. Recommendation:           - Patient has a contact number available for                            emergencies. The signs and symptoms of potential                            delayed complications were discussed with the                            patient. Return to normal activities tomorrow.                            Written  discharge instructions were provided to the                            patient.                           - Resume previous diet.                           - Continue present medications.                           - Await pathology results. Remo Lipps P. Lorren Splawn, MD 12/03/2018 11:45:22 AM This report has been signed electronically.

## 2018-12-03 NOTE — Patient Instructions (Signed)
Thank you for allowing [place] to care for you today!  Await pathology results by mail, approximately 2 weeks.  Recommendation for future colonoscopy will be made at that time.  Resume previous diet and medications today.  Return to your normal activities tomorrow/  Polyp handout provided.      YOU HAD AN ENDOSCOPIC PROCEDURE TODAY AT [facility]:   Refer to the procedure report that was given to you for any specific questions about what was found during the examination.  If the procedure report does not answer your questions, please call your gastroenterologist to clarify.  If you requested that your care partner not be given the details of your procedure findings, then the procedure report has been included in a sealed envelope for you to review at your convenience later.  YOU SHOULD EXPECT: Some feelings of bloating in the abdomen. Passage of more gas than usual.  Walking can help get rid of the air that was put into your GI tract during the procedure and reduce the bloating. If you had a lower endoscopy (such as a colonoscopy or flexible sigmoidoscopy) you may notice spotting of blood in your stool or on the toilet paper. If you underwent a bowel prep for your procedure, you may not have a normal bowel movement for a few days.  Please Note:  You might notice some irritation and congestion in your nose or some drainage.  This is from the oxygen used during your procedure.  There is no need for concern and it should clear up in a day or so.  SYMPTOMS TO REPORT IMMEDIATELY:   Following lower endoscopy (colonoscopy or flexible sigmoidoscopy):  Excessive amounts of blood in the stool  Significant tenderness or worsening of abdominal pains  Swelling of the abdomen that is new, acute  Fever of 100F or higher    For urgent or emergent issues, a gastroenterologist can be reached at any hour by calling [phone].   DIET:  We do recommend a small meal at first, but then you  may proceed to your regular diet.  Drink plenty of fluids but you should avoid alcoholic beverages for 24 hours.  ACTIVITY:  You should plan to take it easy for the rest of today and you should NOT DRIVE or use heavy machinery until tomorrow (because of the sedation medicines used during the test).    FOLLOW UP: Our staff will call the number listed on your records the next business day following your procedure to check on you and address any questions or concerns that you may have regarding the information given to you following your procedure. If we do not reach you, we will leave a message.  However, if you are feeling well and you are not experiencing any problems, there is no need to return our call.  We will assume that you have returned to your regular daily activities without incident.  If any biopsies were taken you will be contacted by phone or by letter within the next 1-3 weeks.  Please call us at [phone] if you have not heard about the biopsies in 3 weeks.    SIGNATURES/CONFIDENTIALITY: You and/or your care partner have signed paperwork which will be entered into your electronic medical record.  These signatures attest to the fact that that the information above on your After Visit Summary has been reviewed and is understood.  Full responsibility of the confidentiality of this discharge information lies with you and/or your care-partner.

## 2018-12-04 ENCOUNTER — Telehealth: Payer: Self-pay

## 2018-12-04 NOTE — Telephone Encounter (Signed)
  Follow up Call-  Call back number 12/03/2018  Post procedure Call Back phone  # [phone]  Permission to leave phone message Yes  Some recent data might be hidden     Patient questions:  Do you have a fever, pain , or abdominal swelling? No. Pain Score  0 *  Have you tolerated food without any problems? Yes.    Have you been able to return to your normal activities? Yes.    Do you have any questions about your discharge instructions: Diet   No. Medications  No. Follow up visit  No.  Do you have questions or concerns about your Care? No.  Actions: * If pain score is 4 or above: No action needed, pain <4.

## 2018-12-09 ENCOUNTER — Other Ambulatory Visit: Payer: Self-pay

## 2018-12-09 MED ORDER — HYDROCHLOROTHIAZIDE 25 MG PO TABS: 25.0000 mg | ORAL_TABLET | Freq: Every day | ORAL | 0 refills | Status: DC

## 2018-12-09 MED ORDER — METOPROLOL TARTRATE 50 MG PO TABS: 50.0000 mg | ORAL_TABLET | Freq: Two times a day (BID) | ORAL | 0 refills | Status: DC

## 2018-12-09 MED ORDER — SIMVASTATIN 10 MG PO TABS: 10.0000 mg | ORAL_TABLET | Freq: Every evening | ORAL | 0 refills | Status: DC

## 2019-03-17 LAB — HM MAMMOGRAPHY: HM Mammogram: ABNORMAL — AB (ref 0–4)

## 2019-03-20 ENCOUNTER — Encounter: Payer: Self-pay | Admitting: Nurse Practitioner

## 2019-03-23 LAB — HM MAMMOGRAPHY: HM Mammogram: ABNORMAL — AB (ref 0–4)

## 2019-03-25 ENCOUNTER — Encounter: Payer: Self-pay | Admitting: Nurse Practitioner

## 2019-03-30 ENCOUNTER — Other Ambulatory Visit: Payer: Self-pay | Admitting: Radiology

## 2019-04-01 ENCOUNTER — Telehealth: Payer: Self-pay | Admitting: Hematology and Oncology

## 2019-04-01 NOTE — Telephone Encounter (Signed)
SPOKE TO PATIENT TO CONFIRM MORNING BC APPOINTMENT FOR 7/8, PACKET EMAILED TO PATIENT

## 2019-04-02 ENCOUNTER — Other Ambulatory Visit: Payer: Self-pay | Admitting: *Deleted

## 2019-04-02 DIAGNOSIS — C50411 Malignant neoplasm of upper-outer quadrant of right female breast: Secondary | ICD-10-CM | POA: Insufficient documentation

## 2019-04-02 DIAGNOSIS — Z170 Estrogen receptor positive status [ER+]: Secondary | ICD-10-CM | POA: Insufficient documentation

## 2019-04-07 NOTE — Progress Notes (Signed)
[facility] CONSULT NOTE  Patient Care Team: Moore, Janece, FNP as PCP - General (General Practice) Stuart, Dawn C, RN as Oncology Nurse Navigator Martini, Keisha N, RN as Oncology Nurse Navigator Gudena, Vinay, MD as Consulting Physician (Hematology and Oncology) Wakefield, Matthew, MD as Consulting Physician (General Surgery) Squire, Sarah, MD as Attending Physician (Radiation Oncology)  CHIEF COMPLAINTS/PURPOSE OF CONSULTATION:  Newly diagnosed breast cancer  HISTORY OF PRESENTING ILLNESS:  Emily Ross 62 y.o. female is here because of recent diagnosis of invasive ductal carcinoma of the right breast. The cancer was detected on a routine screening mammogram on 03/17/19 that showed a right axillary mass. US of the axilla and right breast showed a 0.9cm mass in the right breast. Biopsy on 03/30/19 showed grade 2 invasive ductal carcinoma, HER-2 positive (3+), ER 100%, PR 5%, Ki67 15%. She has a history of left breast cancer in 2009. She has a family history of breast cancer in her sister, at 55yo. She presents to the clinic today for initial evaluation.   I reviewed her records extensively and collaborated the history with the patient.  SUMMARY OF ONCOLOGIC HISTORY: Oncology History  Malignant neoplasm of upper-outer quadrant of right breast in female, estrogen receptor positive (HCC)  04/02/2019 Initial Diagnosis   Screening mammogram detected a mass in the right axilla. US confirmed a 0.9cm right breast mass. Biopsy showed IDC, grade 2, HER-2+ (3+), ER+ 100%, PR+ 5%, Ki67 15%.      MEDICAL HISTORY:  Past Medical History:  Diagnosis Date  . Breast cancer (HCC)   . Cancer (HCC)    2009 left breast  . Family history of breast cancer   . Family history of stomach cancer   . High cholesterol   . Hypertension     SURGICAL HISTORY: Past Surgical History:  Procedure Laterality Date  . BREAST LUMPECTOMY    . COLONOSCOPY      SOCIAL HISTORY: Social History    Socioeconomic History  . Marital status: Married    Spouse name: Not on file  . Number of children: Not on file  . Years of education: Not on file  . Highest education level: Not on file  Occupational History  . Not on file  Social Needs  . Financial resource strain: Not on file  . Food insecurity    Worry: Not on file    Inability: Not on file  . Transportation needs    Medical: Not on file    Non-medical: Not on file  Tobacco Use  . Smoking status: Never Smoker  . Smokeless tobacco: Never Used  Substance and Sexual Activity  . Alcohol use: No  . Drug use: No  . Sexual activity: Not on file  Lifestyle  . Physical activity    Days per week: Not on file    Minutes per session: Not on file  . Stress: Not on file  Relationships  . Social connections    Talks on phone: Not on file    Gets together: Not on file    Attends religious service: Not on file    Active member of club or organization: Not on file    Attends meetings of clubs or organizations: Not on file    Relationship status: Not on file  . Intimate partner violence    Fear of current or ex partner: Not on file    Emotionally abused: Not on file    Physically abused: Not on file    Forced sexual   activity: Not on file  Other Topics Concern  . Not on file  Social History Narrative   ** Merged History Encounter **        FAMILY HISTORY: Family History  Problem Relation Age of Onset  . Diabetes Father   . Hypertension Father   . Stomach cancer Father 48  . Diabetes Mother   . Hypertension Mother   . Breast cancer Sister 77       negative genetic testing  . Breast cancer Maternal Aunt 80  . Colon cancer Neg Hx   . Esophageal cancer Neg Hx   . Rectal cancer Neg Hx     ALLERGIES:  has No Known Allergies.  MEDICATIONS:  Current Outpatient Medications  Medication Sig Dispense Refill  . calcium citrate-vitamin D (CITRACAL+D) 315-200 MG-UNIT tablet Take 1 tablet by mouth daily. 5000 units daily    .  Coenzyme Q10 (CO Q10) 100 MG CAPS Take 1 tablet by mouth daily.    . hydrochlorothiazide (HYDRODIURIL) 25 MG tablet Take 1 tablet (25 mg total) by mouth daily. 90 tablet 0  . metoprolol tartrate (LOPRESSOR) 50 MG tablet Take 1 tablet (50 mg total) by mouth 2 (two) times daily. 90 tablet 1  . metoprolol tartrate (LOPRESSOR) 50 MG tablet Take 1 tablet (50 mg total) by mouth 2 (two) times daily. 180 tablet 0  . simvastatin (ZOCOR) 10 MG tablet Take 1 tablet (10 mg total) by mouth every evening. 90 tablet 0   No current facility-administered medications for this visit.     REVIEW OF SYSTEMS:   Constitutional: Denies fevers, chills or abnormal night sweats Eyes: Denies blurriness of vision, double vision or watery eyes Ears, nose, mouth, throat, and face: Denies mucositis or sore throat Respiratory: Denies cough, dyspnea or wheezes Cardiovascular: Denies palpitation, chest discomfort or lower extremity swelling Gastrointestinal:  Denies nausea, heartburn or change in bowel habits Skin: Denies abnormal skin rashes Lymphatics: Denies new lymphadenopathy or easy bruising Neurological:Denies numbness, tingling or new weaknesses Behavioral/Psych: Mood is stable, no new changes  Breast: Denies any palpable lumps or discharge All other systems were reviewed with the patient and are negative.  PHYSICAL EXAMINATION: ECOG PERFORMANCE STATUS: 1 - Symptomatic but completely ambulatory  Vitals:   04/08/19 0911  BP: (!) 153/81  Pulse: 78  Resp: 20  Temp: 98.2 F (36.8 C)  SpO2: 100%   Filed Weights   04/08/19 0911  Weight: 163 lb 1.6 oz (74 kg)    GENERAL:alert, no distress and comfortable SKIN: skin color, texture, turgor are normal, no rashes or significant lesions EYES: normal, conjunctiva are pink and non-injected, sclera clear OROPHARYNX:no exudate, no erythema and lips, buccal mucosa, and tongue normal  NECK: supple, thyroid normal size, non-tender, without nodularity LYMPH:  no  palpable lymphadenopathy in the cervical, axillary or inguinal LUNGS: clear to auscultation and percussion with normal breathing effort HEART: regular rate & rhythm and no murmurs and no lower extremity edema ABDOMEN:abdomen soft, non-tender and normal bowel sounds Musculoskeletal:no cyanosis of digits and no clubbing  PSYCH: alert & oriented x 3 with fluent speech NEURO: no focal motor/sensory deficits BREAST: No palpable nodules in breast. No palpable axillary or supraclavicular lymphadenopathy (exam performed in the presence of a chaperone)   LABORATORY DATA:  I have reviewed the data as listed Lab Results  Component Value Date   WBC 4.7 04/08/2019   HGB 12.4 04/08/2019   HCT 38.2 04/08/2019   MCV 93.2 04/08/2019   PLT 299 04/08/2019  Lab Results  Component Value Date   NA 140 04/08/2019   K 4.1 04/08/2019   CL 100 04/08/2019   CO2 31 04/08/2019    RADIOGRAPHIC STUDIES: I have personally reviewed the radiological reports and agreed with the findings in the report.  ASSESSMENT AND PLAN:  Malignant neoplasm of upper-outer quadrant of right breast in female, estrogen receptor positive ([place]) 04/02/2019:Screening mammogram detected a mass in the right axilla. [place] confirmed a 0.9cm right breast mass. Biopsy showed IDC, grade 2, HER-2+ (3+), ER+ 100%, PR+ 5%, Ki67 15%.   Pathology and radiology counseling: Discussed with the patient, the details of pathology including the type of breast cancer,the clinical staging, the significance of ER, PR and HER-2/neu receptors and the implications for treatment. After reviewing the pathology in detail, we proceeded to discuss the different treatment options between surgery, radiation, chemotherapy, antiestrogen therapies.  Treatment plan: 1.  Breast conserving surgery with sentinel lymph node biopsy 2.  Adjuvant chemotherapy with Taxol Herceptin weekly x12 followed by Herceptin maintenance for 1 year 3.  Adjuvant radiation therapy 4.  Followed  by adjuvant antiestrogen therapy with anastrozole 1 mg daily x5 to 7 years  Chemotherapy Counseling: I discussed the risks and benefits of chemotherapy including the risks of nausea/ vomiting, risk of infection from low WBC count, fatigue due to chemo or anemia, bruising or bleeding due to low platelets, mouth sores, loss/ change in taste and decreased appetite. Liver and kidney function will be monitored through out chemotherapy as abnormalities in liver and kidney function may be a side effect of treatment.  Neuropathy related to Taxol was also discussed.  Cardiac dysfunction due to Adriamycin was discussed in detail. Risk of permanent bone marrow dysfunction and leukemia due to chemo were also discussed.  Echocardiogram Chemo class Genetic consultation  I will see the patient 1 week after surgery through a virtual visit using Doximity to go over the final pathology results and to discuss starting chemotherapy.     All questions were answered. The patient knows to call the clinic with any problems, questions or concerns.   Rulon Eisenmenger, MD 04/08/2019    I, Molly Dorshimer, am acting as scribe for Nicholas Lose, MD.  I have reviewed the above documentation for accuracy and completeness, and I agree with the above.

## 2019-04-08 ENCOUNTER — Other Ambulatory Visit: Payer: Self-pay

## 2019-04-08 ENCOUNTER — Inpatient Hospital Stay: Payer: 59

## 2019-04-08 ENCOUNTER — Inpatient Hospital Stay: Payer: 59 | Attending: Hematology and Oncology | Admitting: Hematology and Oncology

## 2019-04-08 ENCOUNTER — Inpatient Hospital Stay (HOSPITAL_BASED_OUTPATIENT_CLINIC_OR_DEPARTMENT_OTHER): Payer: 59 | Admitting: Genetic Counselor

## 2019-04-08 ENCOUNTER — Encounter: Payer: Self-pay | Admitting: Hematology and Oncology

## 2019-04-08 ENCOUNTER — Other Ambulatory Visit: Payer: Self-pay | Admitting: General Surgery

## 2019-04-08 ENCOUNTER — Ambulatory Visit
Admission: RE | Admit: 2019-04-08 | Discharge: 2019-04-08 | Disposition: A | Discharge status: Home / Self Care | Payer: 59 | Source: Ambulatory Visit | Attending: Radiation Oncology | Admitting: Radiation Oncology

## 2019-04-08 ENCOUNTER — Ambulatory Visit: Payer: 59 | Attending: General Surgery | Admitting: Physical Therapy

## 2019-04-08 ENCOUNTER — Encounter: Payer: Self-pay | Admitting: Genetic Counselor

## 2019-04-08 ENCOUNTER — Encounter: Payer: Self-pay | Admitting: Radiation Oncology

## 2019-04-08 DIAGNOSIS — Z170 Estrogen receptor positive status [ER+]: Secondary | ICD-10-CM

## 2019-04-08 DIAGNOSIS — C50411 Malignant neoplasm of upper-outer quadrant of right female breast: Secondary | ICD-10-CM

## 2019-04-08 DIAGNOSIS — I10 Essential (primary) hypertension: Secondary | ICD-10-CM | POA: Diagnosis not present

## 2019-04-08 DIAGNOSIS — C50211 Malignant neoplasm of upper-inner quadrant of right female breast: Secondary | ICD-10-CM | POA: Insufficient documentation

## 2019-04-08 DIAGNOSIS — Z803 Family history of malignant neoplasm of breast: Secondary | ICD-10-CM | POA: Insufficient documentation

## 2019-04-08 DIAGNOSIS — R293 Abnormal posture: Secondary | ICD-10-CM

## 2019-04-08 DIAGNOSIS — Z79899 Other long term (current) drug therapy: Secondary | ICD-10-CM | POA: Insufficient documentation

## 2019-04-08 DIAGNOSIS — Z800 Family history of malignant neoplasm of digestive organs: Secondary | ICD-10-CM | POA: Diagnosis not present

## 2019-04-08 DIAGNOSIS — Z1379 Encounter for other screening for genetic and chromosomal anomalies: Secondary | ICD-10-CM

## 2019-04-08 LAB — CBC WITH DIFFERENTIAL (CANCER CENTER ONLY)
Abs Immature Granulocytes: 0.01 10*3/uL (ref 0.00–0.07)
Basophils Absolute: 0 10*3/uL (ref 0.0–0.1)
Basophils Relative: 1 %
Eosinophils Absolute: 0.1 10*3/uL (ref 0.0–0.5)
Eosinophils Relative: 3 %
HCT: 38.2 % (ref 36.0–46.0)
Hemoglobin: 12.4 g/dL (ref 12.0–15.0)
Immature Granulocytes: 0 %
Lymphocytes Relative: 36 %
Lymphs Abs: 1.7 10*3/uL (ref 0.7–4.0)
MCH: 30.2 pg (ref 26.0–34.0)
MCHC: 32.5 g/dL (ref 30.0–36.0)
MCV: 93.2 fL (ref 80.0–100.0)
Monocytes Absolute: 0.4 10*3/uL (ref 0.1–1.0)
Monocytes Relative: 8 %
Neutro Abs: 2.5 10*3/uL (ref 1.7–7.7)
Neutrophils Relative %: 52 %
Platelet Count: 299 10*3/uL (ref 150–400)
RBC: 4.1 MIL/uL (ref 3.87–5.11)
RDW: 13 % (ref 11.5–15.5)
WBC Count: 4.7 10*3/uL (ref 4.0–10.5)
nRBC: 0 % (ref 0.0–0.2)

## 2019-04-08 LAB — CMP (CANCER CENTER ONLY)
ALT: 12 U/L (ref 0–44)
AST: 17 U/L (ref 15–41)
Albumin: 4.1 g/dL (ref 3.5–5.0)
Alkaline Phosphatase: 67 U/L (ref 38–126)
Anion gap: 9 (ref 5–15)
BUN: 9 mg/dL (ref 8–23)
CO2: 31 mmol/L (ref 22–32)
Calcium: 10.1 mg/dL (ref 8.9–10.3)
Chloride: 100 mmol/L (ref 98–111)
Creatinine: 0.92 mg/dL (ref 0.44–1.00)
GFR, Est AFR Am: >60 mL/min (ref >60)
GFR, Estimated: >60 mL/min (ref >60)
Glucose, Bld: 96 mg/dL (ref 70–99)
Potassium: 4.1 mmol/L (ref 3.5–5.1)
Sodium: 140 mmol/L (ref 135–145)
Total Bilirubin: 0.5 mg/dL (ref 0.3–1.2)
Total Protein: 7.9 g/dL (ref 6.5–8.1)

## 2019-04-08 NOTE — Assessment & Plan Note (Addendum)
04/02/2019:Screening mammogram detected a mass in the right axilla. [place] confirmed a 0.9cm right breast mass. Biopsy showed IDC, grade 2, HER-2+ (3+), ER+ 100%, PR+ 5%, Ki67 15%.   Pathology and radiology counseling: Discussed with the patient, the details of pathology including the type of breast cancer,the clinical staging, the significance of ER, PR and HER-2/neu receptors and the implications for treatment. After reviewing the pathology in detail, we proceeded to discuss the different treatment options between surgery, radiation, chemotherapy, antiestrogen therapies.  Treatment plan: 1.  Breast conserving surgery with sentinel lymph node biopsy 2.  Adjuvant chemotherapy with Taxol Herceptin weekly x12 followed by Herceptin maintenance for 1 year 3.  Adjuvant radiation therapy 4.  Followed by adjuvant antiestrogen therapy with anastrozole 1 mg daily x5 to 7 years  Chemotherapy Counseling: I discussed the risks and benefits of chemotherapy including the risks of nausea/ vomiting, risk of infection from low WBC count, fatigue due to chemo or anemia, bruising or bleeding due to low platelets, mouth sores, loss/ change in taste and decreased appetite. Liver and kidney function will be monitored through out chemotherapy as abnormalities in liver and kidney function may be a side effect of treatment.  Neuropathy related to Taxol was also discussed.  Cardiac dysfunction due to Adriamycin was discussed in detail. Risk of permanent bone marrow dysfunction and leukemia due to chemo were also discussed.  Echocardiogram Chemo class Genetic consultation  I will see the patient 1 week after surgery through a virtual visit using Doximity to go over the final pathology results and to discuss starting chemotherapy.

## 2019-04-08 NOTE — Therapy (Signed)
[place], [place], [address] Phone: [phone]   Fax:  [phone]  Physical Therapy Evaluation  Patient Details  Name: Emily Ross MRN: [id number] Date of Birth: [birth year] Referring Provider (PT): Dr. Rolm Bookbinder   Encounter Date: 04/08/2019  PT End of Session - 04/08/19 1216    Visit Number  1    Number of Visits  2    Date for PT Re-Evaluation  06/03/19    PT Start Time  6808    PT Stop Time  1045   Also saw pt from 1055-1110 for a total of 25 minutes   PT Time Calculation (min)  10 min    Activity Tolerance  Patient tolerated treatment well    Behavior During Therapy  [facility] for tasks assessed/performed       Past Medical History:  Diagnosis Date  . Breast cancer ([place])   . Cancer [facility])    2009 left breast  . Family history of breast cancer   . Family history of stomach cancer   . High cholesterol   . Hypertension     Past Surgical History:  Procedure Laterality Date  . BREAST LUMPECTOMY    . COLONOSCOPY      There were no vitals filed for this visit.   Subjective Assessment - 04/08/19 1155    Subjective  Patient reports she is here today to be seen by her medical team for her newly diagnosed right breast cancer.    Pertinent History  Patient was diagnosed on 03/17/2019 with right grade II triple negative invasive ductal carcinoma breast cancer. It measures 9 mm and is located in the upper inner quadrant. The Ki67 is 15%. She has a history of a left lumpectomy and radiation in 2009.    Patient Stated Goals  Reduce lymphedema risk and learn post op shoulder ROM HEP    Currently in Pain?  No/denies         [facility] PT Assessment - 04/08/19 0001      Assessment   Medical Diagnosis  Right breast cancer    Referring Provider (PT)  Dr. Rolm Bookbinder    Onset Date/Surgical Date  03/17/19    Hand Dominance  Right    Prior Therapy  none      Precautions   Precautions   Other (comment)    Precaution Comments  Active cancer; possible left UE lymphedema risk      Restrictions   Weight Bearing Restrictions  No      Balance Screen   Has the patient fallen in the past 6 months  No    Has the patient had a decrease in activity level because of a fear of falling?   No    Is the patient reluctant to leave their home because of a fear of falling?   No      Home Film/video editor residence    Living Arrangements  Spouse/significant other    Available Help at Discharge  Family      Prior Function   Level of [place]  Unemployed    Leisure  She walks 30-60 min 5x/week      Cognition   Overall Cognitive Status  Within Functional Limits for tasks assessed      Posture/Postural Control   Posture/Postural Control  Postural limitations    Postural Limitations  Rounded Shoulders;Forward head      ROM /  Strength   AROM / PROM / Strength  AROM;Strength      AROM   Overall AROM Comments  Cervical AROM is WNL    AROM Assessment Site  Shoulder    Right/Left Shoulder  Right;Left    Right Shoulder Extension  56 Degrees    Right Shoulder Flexion  153 Degrees    Right Shoulder ABduction  175 Degrees    Right Shoulder Internal Rotation  75 Degrees    Right Shoulder External Rotation  85 Degrees    Left Shoulder Extension  55 Degrees    Left Shoulder Flexion  148 Degrees    Left Shoulder ABduction  173 Degrees    Left Shoulder Internal Rotation  65 Degrees    Left Shoulder External Rotation  88 Degrees      Strength   Overall Strength  Within functional limits for tasks performed                Objective measurements completed on examination: See above findings.         Patient was instructed today in a home exercise program today for post op shoulder range of motion. These included active assist shoulder flexion in sitting, scapular retraction, wall walking with shoulder abduction, and hands  behind head external rotation.  She was encouraged to do these twice a day, holding 3 seconds and repeating 5 times when permitted by her physician.         PT Education - 04/08/19 1212    Education Details  Lymphedema risk reduction and post op shoulder ROM HEP    Person(s) Educated  Patient    Methods  Explanation;Demonstration;Handout    Comprehension  Returned demonstration;Verbalized understanding          PT Long Term Goals - 04/08/19 1222      PT LONG TERM GOAL #1   Title  Patient will demonstrate she has regained shoulder ROM and function post operatively compared ot baselines.    Time  8    Period  Weeks    Status  New    Target Date  06/03/19             Plan - 04/08/19 1217    Clinical Impression Statement  Patient was diagnosed on 03/17/2019 with right grade II triple negative invasive ductal carcinoma breast cancer. It measures 9 mm and is located in the upper inner quadrant. The Ki67 is 15%. She has a history of a left lumpectomy and radiation in 2009. Her multidisciplinary medical team met prior to her assessments to determine a recommended treatment plan.She is planning to have a right lumpectomy and sentinel node biopsy followed by chemotherapy, radiation, and anti-estrogen therapy. She will benefit from a post op PT visit to reassess and determine needs.    Personal Factors and Comorbidities  Comorbidity 1    Comorbidities  Previous left breast cancer    Stability/Clinical Decision Making  Stable/Uncomplicated    Clinical Decision Making  Low    Rehab Potential  Excellent    PT Frequency  --   Eval and 1 f/u visit   PT Treatment/Interventions  ADLs/Self Care Home Management;Therapeutic exercise;Patient/family education    PT Next Visit Plan  Will reassess 3-4 weeks post op to determine needs    PT Home Exercise Plan  Post op shoulder ROM HEP    Consulted and Agree with Plan of Care  Patient       Patient will benefit from skilled therapeutic  intervention  in order to improve the following deficits and impairments:  Pain, Impaired UE functional use, Decreased knowledge of precautions, Decreased range of motion  Visit Diagnosis: 1. Malignant neoplasm of upper-inner quadrant of right breast in female, estrogen receptor positive ([place])   2. Abnormal posture    Patient will follow up at outpatient cancer rehab 3-4 weeks following surgery.  If the patient requires physical therapy at that time, a specific plan will be dictated and sent to the referring physician for approval. The patient was educated today on appropriate basic range of motion exercises to begin post operatively and the importance of attending the After Breast Cancer class following surgery.  Patient was educated today on lymphedema risk reduction practices as it pertains to recommendations that will benefit the patient immediately following surgery.  She verbalized good understanding.       Problem List Patient Active Problem List   Diagnosis Date Noted  . Family history of breast cancer   . Family history of stomach cancer   . Malignant neoplasm of upper-outer quadrant of right breast in female, estrogen receptor positive ([place]) 04/02/2019  . Essential hypertension 10/28/2018  . Hypercalcemia 10/28/2018  . Elevated LDL cholesterol level 10/28/2018  . Prediabetes 04/09/2018   Annia Friendly, PT 04/08/19 12:26 PM  [place], [place], [address] Phone: [phone]   Fax:  [phone]  Name: Emily Ross MRN: [id number] Date of Birth: [birth year]

## 2019-04-08 NOTE — Patient Instructions (Signed)

## 2019-04-08 NOTE — Progress Notes (Signed)
Radiation Oncology         [phone] ________________________________  Initial outpatient Consultation  Name: Emily Ross MRN: [id number]  Date: 04/08/2019  DOB: 21-May-1956  CC:Minette Brine, FNP  Rolm Bookbinder, MD   REFERRING PHYSICIAN: Rolm Bookbinder, MD  DIAGNOSIS:    ICD-10-CM   1. Malignant neoplasm of upper-outer quadrant of right breast in female, estrogen receptor positive ([place])  C50.411    Z17.0    Cancer Staging Malignant neoplasm of upper-outer quadrant of right breast in female, estrogen receptor positive ([place]) Staging form: Breast, AJCC 8th Edition - Clinical stage from 04/08/2019: Stage IA (cT1b, cN0, cM0, G2, ER+, PR+, HER2+) - Signed by Nicholas Lose, MD on 04/08/2019   CHIEF COMPLAINT: Here to discuss management of right breast cancer  HISTORY OF PRESENT ILLNESS::Emily Ross is a 63 y.o. female who reports a previous history of left breast cancer which was treated  with breast conserving surgery and approximately 6 weeks of adjuvant radiotherapy.  Please see medical oncology notes for more details regarding systemic therapy.    Recently, she presented with a right axillary mass on mammography.  Ultrasound of the right axilla revealed a 9 mm right breast mass.  There were 2 lymph nodes in the right axilla that were felt to be benign on ultrasound.  Biopsy of the right breast mass revealed triple positive invasive ductal carcinoma, grade 2.  I reviewed her imaging personally this morning at tumor board.  She is otherwise in her usual state of health.  She reports postbiopsy bruising and tenderness in the right breast  PREVIOUS RADIATION THERAPY: Yes the patient approximates that she received 6 weeks of radiotherapy in 2009 through [facility], records are currently archived, this was delivered to her left breast.  PAST MEDICAL HISTORY:  has a past medical history of Breast cancer ([place]), Cancer ([place]), Family history of breast cancer, Family history of  stomach cancer, High cholesterol, and Hypertension.    PAST SURGICAL HISTORY: Past Surgical History:  Procedure Laterality Date  . BREAST LUMPECTOMY    . COLONOSCOPY      FAMILY HISTORY: family history includes Breast cancer (age of onset: 60) in her sister; Breast cancer (age of onset: 54) in her maternal aunt; Diabetes in her father and mother; Hypertension in her father and mother; Stomach cancer (age of onset: 41) in her father.  SOCIAL HISTORY:  reports that she has never smoked. She has never used smokeless tobacco. She reports that she does not drink alcohol or use drugs.  ALLERGIES: Patient has no known allergies.  MEDICATIONS:  Current Outpatient Medications  Medication Sig Dispense Refill  . calcium citrate-vitamin D (CITRACAL+D) 315-200 MG-UNIT tablet Take 1 tablet by mouth daily. 5000 units daily    . Coenzyme Q10 (CO Q10) 100 MG CAPS Take 1 tablet by mouth daily.    . hydrochlorothiazide (HYDRODIURIL) 25 MG tablet Take 1 tablet (25 mg total) by mouth daily. 90 tablet 0  . metoprolol tartrate (LOPRESSOR) 50 MG tablet Take 1 tablet (50 mg total) by mouth 2 (two) times daily. 90 tablet 1  . metoprolol tartrate (LOPRESSOR) 50 MG tablet Take 1 tablet (50 mg total) by mouth 2 (two) times daily. 180 tablet 0  . simvastatin (ZOCOR) 10 MG tablet Take 1 tablet (10 mg total) by mouth every evening. 90 tablet 0   No current facility-administered medications for this encounter.     REVIEW OF SYSTEMS: As above   PHYSICAL EXAM:  Vitals with Age-Percentiles 04/08/2019  Length 573.2 cm  Systolic 202  Diastolic 81  Pulse 78  Respiration 20  Weight 73.982 kg  BMI 26.34  VISIT REPORT    General: Alert and oriented, in no acute distress Extremities: No cyanosis or edema. Skin: No concerning lesions. Psychiatric: Judgment and insight are intact. Affect is appropriate. Breasts: Postbiopsy bruising of upper inner quadrant, right breast.  At the 1:30 position of the right breast there  is a 2 cm postbiopsy mass.  At 1:30 position of the left breast there is fibrosis consistent with a treated lumpectomy cavity; there is residual hyperpigmentation in the central left breast consistent with prior radiotherapy- no other palpable masses appreciated in the breasts or axillae bilaterally.  ECOG = 0  0 - Asymptomatic (Fully active, able to carry on all predisease activities without restriction)  1 - Symptomatic but completely ambulatory (Restricted in physically strenuous activity but ambulatory and able to carry out work of a light or sedentary nature. For example, light housework, office work)  2 - Symptomatic, <50% in bed during the day (Ambulatory and capable of all self care but unable to carry out any work activities. Up and about more than 50% of waking hours)  3 - Symptomatic, >50% in bed, but not bedbound (Capable of only limited self-care, confined to bed or chair 50% or more of waking hours)  4 - Bedbound (Completely disabled. Cannot carry on any self-care. Totally confined to bed or chair)  5 - Death   Eustace Pen MM, Creech RH, Tormey DC, et al. [phone]). "Toxicity and response criteria of the [facility]". [place] Oncol. 5 (6): 649-55   LABORATORY DATA:  Lab Results  Component Value Date   WBC 4.7 04/08/2019   HGB 12.4 04/08/2019   HCT 38.2 04/08/2019   MCV 93.2 04/08/2019   PLT 299 04/08/2019   CMP     Component Value Date/Time   NA 140 04/08/2019 0844   NA 141 10/28/2018 0934   K 4.1 04/08/2019 0844   CL 100 04/08/2019 0844   CO2 31 04/08/2019 0844   GLUCOSE 96 04/08/2019 0844   BUN 9 04/08/2019 0844   BUN 13 10/28/2018 0934   CREATININE 0.92 04/08/2019 0844   CALCIUM 10.1 04/08/2019 0844   PROT 7.9 04/08/2019 0844   PROT 7.1 10/28/2018 0934   ALBUMIN 4.1 04/08/2019 0844   ALBUMIN 4.6 10/28/2018 0934   AST 17 04/08/2019 0844   ALT 12 04/08/2019 0844   ALKPHOS 67 04/08/2019 0844   BILITOT 0.5 04/08/2019 0844   GFRNONAA >60  04/08/2019 0844   GFRAA >60 04/08/2019 0844         RADIOGRAPHY: As above IMPRESSION/PLAN: Right breast cancer  She has been discussed at our multidisciplinary tumor board.  The consensus is that she would be a good candidate for breast conservation. I talked to her about the option of a mastectomy and informed her that her expected overall survival would be equivalent between mastectomy and breast conservation, based upon randomized controlled data. She is enthusiastic about breast conservation.  It was a pleasure meeting the patient today. We discussed the risks, benefits, and side effects of radiotherapy. I recommend radiotherapy to the right breast to reduce her risk of locoregional recurrence by 2/3.  We discussed that radiation would take approximately 4 weeks to complete and that I would give the patient a few weeks to heal following surgery before starting treatment planning.  If chemotherapy were to be given, this would precede radiotherapy.  We spoke about acute effects including skin irritation and fatigue as well as much less common late effects including internal organ injury or irritation. We spoke about the latest technology that is used to minimize the risk of late effects for patients undergoing radiotherapy to the breast or chest wall. No guarantees of treatment were given. The patient is enthusiastic about proceeding with treatment. I look forward to participating in the patient's care.  I will await her referral back to me for postoperative follow-up and eventual CT simulation/treatment planning.  __________________________________________   Eppie Gibson, MD

## 2019-04-08 NOTE — Progress Notes (Signed)
REFERRING PROVIDER: Nicholas Lose, MD [address] [place],  [place] [address]  PRIMARY PROVIDER:  Minette Brine, FNP  PRIMARY REASON FOR VISIT:  1. Malignant neoplasm of upper-outer quadrant of right breast in female, estrogen receptor positive ([place])   2. Family history of breast cancer   3. Family history of stomach cancer    I connected with Ms. Jerrell on 04/08/2019 at 11:15 am EDT by Webex video conference and verified that I am speaking with the correct person using two identifiers.   Patient location: clinic Provider location: clinic   HISTORY OF PRESENT ILLNESS:   Emily Ross, a 63 y.o. female, was seen for a [place] cancer genetics consultation at the request of Dr. Lindi Adie due to a personal and family history of breast cancer.  Ms. Feeser presents to clinic today to discuss the possibility of a hereditary predisposition to cancer, genetic testing, and to further clarify her future cancer risks, as well as potential cancer risks for family members.   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  In 2020, at the age of 17, Ms. Holshouser was diagnosed with IDC, ER+/PR+/Her2+ of the right breast. The treatment plan includes surgery, chemotherapy, radiation, and antiestrogen therapy.   Ms. Talsma had genetic testing in 2009 that was negative, although this report was unavailable for review at today's visit.  CANCER HISTORY:  Oncology History  Malignant neoplasm of upper-outer quadrant of right breast in female, estrogen receptor positive ([place])  04/02/2019 Initial Diagnosis   Screening mammogram detected a mass in the right axilla. [place] confirmed a 0.9cm right breast mass. Biopsy showed IDC, grade 2, HER-2+ (3+), ER+ 100%, PR+ 5%, Ki67 15%.       Past Medical History:  Diagnosis Date  . Breast cancer ([place])   . Cancer [facility])    2009 left breast  . Family history of breast cancer   . Family history of stomach cancer   . High  cholesterol   . Hypertension     Past Surgical History:  Procedure Laterality Date  . BREAST LUMPECTOMY    . COLONOSCOPY      Social History   Socioeconomic History  . Marital status: Married    Spouse name: Not on file  . Number of children: Not on file  . Years of education: Not on file  . Highest education level: Not on file  Occupational History  . Not on file  Social Needs  . Financial resource strain: Not on file  . Food insecurity    Worry: Not on file    Inability: Not on file  . Transportation needs    Medical: Not on file    Non-medical: Not on file  Tobacco Use  . Smoking status: Never Smoker  . Smokeless tobacco: Never Used  Substance and Sexual Activity  . Alcohol use: No  . Drug use: No  . Sexual activity: Not on file  Lifestyle  . Physical activity    Days per week: Not on file    Minutes per session: Not on file  . Stress: Not on file  Relationships  . Social Herbalist on phone: Not on file    Gets together: Not on file    Attends religious service: Not on file    Active member of club or organization: Not on file    Attends meetings of clubs or organizations: Not on file    Relationship  status: Not on file  Other Topics Concern  . Not on file  Social History Narrative   ** Merged History Encounter **         FAMILY HISTORY:  We obtained a detailed, 4-generation family history.  Significant diagnoses are listed below: Family History  Problem Relation Age of Onset  . Diabetes Father   . Hypertension Father   . Stomach cancer Father 63  . Diabetes Mother   . Hypertension Mother   . Breast cancer Sister 63       negative genetic testing  . Breast cancer Maternal Aunt 80  . Colon cancer Neg Hx   . Esophageal cancer Neg Hx   . Rectal cancer Neg Hx     Ms. Marling has a sister, Eliseo Gum, who was diagnosed with breast cancer at age 62. Her sister had genetic testing in the past year that was negative. Ms. Carmical has two  other sisters and one brother, none of whom have had a diagnosis of cancer.  Ms. Pettibone has two maternal aunts who had breast cancer, both diagnosed in their 2s, who both passed away of unrelated causes in their 58s. She has nine other maternal aunts and uncles and multiple cousins, but does not know of any other cancer diagnoses on her maternal side.  Ms. Motsinger's father was diagnosed with a "very rare" form of stomach cancer when he was 60. There are no other known cancer diagnoses on her paternal side. Of note, her paternal grandmother died in her 31S due to complications during childbirth.  Ms. Botkin is unaware of any other family history of genetic testing for hereditary cancer risks.  Ms. Kyer's maternal ancestors are of African American descent, and paternal ancestors are of African American descent. There is no reported Ashkenazi Jewish ancestry. There is no known consanguinity.    GENETIC COUNSELING ASSESSMENT: Ms. Cyphers is a 63 y.o. female with a personal and family history of breast cancer, which is somewhat suggestive of a hereditary cancer syndrome and predisposition to cancer. We, therefore, discussed and recommended the following at today's visit.   DISCUSSION: We discussed that 5 - 10% of breast cancer is hereditary, with most cases associated with BRCA1/2.  There are other genes that can be associated with hereditary breast cancer syndromes.  These include ATM, CHEK2, PALB2, etc.  We discussed that testing is beneficial for several reasons including surgical decision making for breast cancer, knowing how to follow individuals after completing their treatment, and to understand if other family members could be at risk for cancer and allow them to undergo genetic testing.  Although Ms. Westby had genetic testing that was reportedly negative in 2009, we know that updated genetic testing is warranted due to advances in genetic knowledge and testing capabilities.  We reviewed the  characteristics, features and inheritance patterns of hereditary cancer syndromes. We also discussed genetic testing, including the appropriate family members to test, the process of testing, insurance coverage and turn-around-time for results. We discussed the implications of a negative, positive and/or variant of uncertain significant result. In order to get genetic test results in a timely manner so that Ms. Waskey can use these genetic test results for surgical decisions, we recommended Ms. Freshour pursue genetic testing for the [facility] Breast Cancer STAT gene panel. We discussed the option to pursue reflex genetic testing to the [facility] Common Hereditary Cancers gene panel after receiving the initial results. Ms. Ocon is unsure at this time if she wants to pursue  this reflex genetic testing. She will have 90 days to add-on reflex genetic testing with no additional charge.   The STAT Breast cancer panel offered by [facility] includes sequencing and rearrangement analysis for the following 9 genes:  ATM, BRCA1, BRCA2, CDH1, CHEK2, PALB2, PTEN, STK11 and TP53.     Based on Ms. Carbine's personal and family history of cancer, she meets medical criteria for genetic testing. Despite that she meets criteria, she may still have an out of pocket cost.   PLAN: After considering the risks, benefits, and limitations, Ms. Karapetian provided informed consent to pursue genetic testing and the blood sample was sent to [facility] for analysis of the Breast Cancer STAT gene panel. Results should be available within approximately one weeks' time, at which point they will be disclosed by telephone to Ms. Disbrow, as will any additional recommendations warranted by these results. Ms. Hampe will receive a summary of her genetic counseling visit and a copy of her results once available. This information will also be available in Epic.   Ms. Wauters's questions were answered to her satisfaction today. Our contact  information was provided should additional questions or concerns arise. Thank you for the referral and allowing [place] to share in the care of your patient.   Clint Guy, MS Genetic Counselor [email or web address] Phone: [phone]   The patient was seen for a total of 30 minutes in face-to-face genetic counseling.  This patient was discussed with Drs. Magrinat, Lindi Adie and/or Burr Medico who agrees with the above.   _______________________________________________________________________ For Office Staff:  Number of people involved in session: 1 Was an Intern/ student involved with case: no

## 2019-04-09 ENCOUNTER — Other Ambulatory Visit: Payer: Self-pay | Admitting: General Surgery

## 2019-04-10 ENCOUNTER — Telehealth: Payer: Self-pay | Admitting: *Deleted

## 2019-04-10 DIAGNOSIS — Z170 Estrogen receptor positive status [ER+]: Secondary | ICD-10-CM

## 2019-04-10 DIAGNOSIS — C50411 Malignant neoplasm of upper-outer quadrant of right female breast: Secondary | ICD-10-CM

## 2019-04-10 NOTE — Telephone Encounter (Signed)
Spoke to pt concerning [place] from 7.8.20. Denies questions or concerns regarding dx or treatment care plan. Discussed next steps of sx followed by f/u with Dr. Lindi Adie, echo and chemo class. Received verbal understanding. Encourage pt to call with needs. Contact information provided.

## 2019-04-13 ENCOUNTER — Other Ambulatory Visit: Payer: Self-pay

## 2019-04-13 ENCOUNTER — Encounter (HOSPITAL_COMMUNITY)
Admission: RE | Admit: 2019-04-13 | Discharge: 2019-04-13 | Disposition: A | Discharge status: Home / Self Care | Payer: 59 | Source: Ambulatory Visit | Attending: General Surgery | Admitting: General Surgery

## 2019-04-13 ENCOUNTER — Encounter: Payer: Self-pay | Admitting: Nurse Practitioner

## 2019-04-13 ENCOUNTER — Encounter (HOSPITAL_COMMUNITY): Payer: Self-pay

## 2019-04-13 DIAGNOSIS — Z1159 Encounter for screening for other viral diseases: Secondary | ICD-10-CM | POA: Insufficient documentation

## 2019-04-13 DIAGNOSIS — Z01812 Encounter for preprocedural laboratory examination: Secondary | ICD-10-CM | POA: Diagnosis present

## 2019-04-13 HISTORY — DX: Allergy status to unspecified drugs, medicaments and biological substances: Z88.9

## 2019-04-13 HISTORY — DX: Pneumonia, unspecified organism: J18.9

## 2019-04-13 HISTORY — DX: Presence of spectacles and contact lenses: Z97.3

## 2019-04-13 NOTE — Pre-Procedure Instructions (Signed)
   Emily Ross  04/13/2019    [facility] # [place] [place] [address] [place] [place] [address] Phone: [phone] Fax: [phone]   Your procedure is scheduled on Friday, April 17, 2019  Report to [facility] Admitting at 12:30 P.M.  Call this number if you have problems the morning of surgery:  [phone]   Remember: Brush your teeth the morning of surgery with your regular toothpaste.  Do not eat after midnight Thursday, April 16, 2019    You may drink clear liquids until 11:30A.M .  Clear liquids allowed are:                    Water, Juice (non-citric and without pulp), Carbonated beverages, Clear Tea, Black Coffee only, Plain Jell-O only, Gatorade and Plain Popsicles only Please drink your Ensure Pre-Surgery drink by 11:30 A.M the morning of surgery    Take these medicines the morning of surgery with A SIP OF WATER:  metoprolol tartrate (LOPRESSOR) If needed: acetaminophen (TYLENOL) for pain If needed: diphenhydrAMINE (BENADRYL) for allergies If needed:LUBRICATING EYE DROPS   Stop taking Aspirin (unless otherwise advised by surgeon), vitamins, fish oil, RED YEAST RICE, Coenzyme Q10 (CO Q-10 )  and herbal medications. Do not take any NSAIDs ie: Ibuprofen, Advil, Naproxen (Aleve), Motrin, BC and Goody Powder; stop now.  Do not wear jewelry, make-up or nail polish.  Do not wear lotions, powders, or perfumes, or deodorant.  Do not shave 48 hours prior to surgery.   Do not bring valuables to the hospital.  [facility] is not responsible for any belongings or valuables.  Contacts, dentures or bridgework may not be worn into surgery.  For patients admitted to the hospital, discharge time will be determined by your treatment team. Patients discharged the day of surgery will not be allowed to drive home.  Special instructions: Shower the night before and the morning of surgery with CHG. Please read over the following fact sheets  that you were given. Pain Booklet, Coughing and Deep Breathing and Surgical Site Infection Prevention

## 2019-04-13 NOTE — Progress Notes (Signed)
Pt denies SOB, chest pain, and being under the care of a cardiologist. Pt denies having a stress test, echo and cardiac cath. Pt denies having a chest x ray within the last year. Pt stated that an EKG was performed at PCP's office, Minette Brine, NP, at [facility]; nurse requested LOV note and EKG tracing.  Pt denies that she and family members tested positive for COVID-19 ( pt stated that she is scheduled to be tested on 04/15/19; pt reminded to quarantine).  Coronavirus Screening  Pt denies that she and family members experienced the following symptoms:  Cough yes/no: No Fever (>100.2F)  yes/no: No Runny nose yes/no: No Sore throat yes/no: No Difficulty breathing/shortness of breath  yes/no: No  Have you or a family member traveled in the last 14 days and where? yes/no: No  Pt reminded that hospital visitation restrictions are in effect and the importance of the restrictions.   Pt verbalized understanding of all pre-op instructions.

## 2019-04-14 ENCOUNTER — Other Ambulatory Visit: Payer: Self-pay | Admitting: General Surgery

## 2019-04-14 DIAGNOSIS — Z1379 Encounter for other screening for genetic and chromosomal anomalies: Secondary | ICD-10-CM | POA: Insufficient documentation

## 2019-04-15 ENCOUNTER — Ambulatory Visit: Payer: Self-pay | Admitting: Genetic Counselor

## 2019-04-15 ENCOUNTER — Encounter: Payer: Self-pay | Admitting: Genetic Counselor

## 2019-04-15 ENCOUNTER — Telehealth: Payer: Self-pay

## 2019-04-15 ENCOUNTER — Telehealth: Payer: Self-pay | Admitting: Genetic Counselor

## 2019-04-15 ENCOUNTER — Other Ambulatory Visit (HOSPITAL_COMMUNITY)
Admission: RE | Admit: 2019-04-15 | Discharge: 2019-04-15 | Disposition: A | Discharge status: Home / Self Care | Payer: 59 | Source: Ambulatory Visit | Attending: General Surgery | Admitting: General Surgery

## 2019-04-15 DIAGNOSIS — Z1379 Encounter for other screening for genetic and chromosomal anomalies: Secondary | ICD-10-CM

## 2019-04-15 DIAGNOSIS — Z01812 Encounter for preprocedural laboratory examination: Secondary | ICD-10-CM | POA: Diagnosis not present

## 2019-04-15 LAB — SARS CORONAVIRUS 2 BY RT PCR (HOSPITAL ORDER, PERFORMED IN ~~LOC~~ HOSPITAL LAB): SARS Coronavirus 2: NEGATIVE

## 2019-04-15 NOTE — Progress Notes (Signed)
HPI:  [name] was previously seen in the Breast Multidisciplinary clinic due to a personal and family history of breast cancer and concerns regarding a hereditary predisposition to cancer. Please refer to our prior cancer genetics clinic note for more information regarding our discussion, assessment and recommendations, at the time. [name]'s recent genetic test results were disclosed to her, as were recommendations warranted by these results. These results and recommendations are discussed in more detail below.  CANCER HISTORY:  Oncology History  Malignant neoplasm of upper-outer quadrant of right breast in female, estrogen receptor positive ([place])  04/02/2019 Initial Diagnosis   Screening mammogram detected a mass in the right axilla. [place] confirmed a 0.9cm right breast mass. Biopsy showed IDC, grade 2, HER-2+ (3+), ER+ 100%, PR+ 5%, Ki67 15%.    04/08/2019 Cancer Staging   Staging form: Breast, AJCC 8th Edition - Clinical stage from 04/08/2019: Stage IA (cT1b, cN0, cM0, G2, ER+, PR+, HER2+) - Signed by Nicholas Lose, MD on 04/08/2019   04/14/2019 Genetic Testing   Patient had genetic testing done for her personal and family history of breast cancer.  Negative genetic testing on the [facility] Breast Cancer STAT panel. The STAT Breast cancer panel offered by [facility] includes sequencing and rearrangement analysis for the following 9 genes:  ATM, BRCA1, BRCA2, CDH1, CHEK2, PALB2, PTEN, STK11 and TP53.  The report date is 04/14/2019.   Carcinoma of upper-inner quadrant of right breast in female, estrogen receptor positive ([place])  04/08/2019 Initial Diagnosis   Carcinoma of upper-inner quadrant of right breast in female, estrogen receptor positive ([place])   04/08/2019 Cancer Staging   Staging form: Breast, AJCC 8th Edition - Clinical stage from 04/08/2019: Stage IA (cT1b, cN0, cM0, G2, ER+, PR+, HER2+) - Signed by Eppie Gibson, MD on 04/08/2019   04/14/2019 Genetic Testing   Patient had genetic testing done for  her personal and family history of breast cancer.  Negative genetic testing on the [facility] Breast Cancer STAT panel. The STAT Breast cancer panel offered by [facility] includes sequencing and rearrangement analysis for the following 9 genes:  ATM, BRCA1, BRCA2, CDH1, CHEK2, PALB2, PTEN, STK11 and TP53.  The report date is 04/14/2019.     FAMILY HISTORY:  We obtained a detailed, 4-generation family history.  Significant diagnoses are listed below: Family History  Problem Relation Age of Onset   Diabetes Father    Hypertension Father    Stomach cancer Father 91   Diabetes Mother    Hypertension Mother    Breast cancer Sister 86       negative genetic testing   Breast cancer Maternal Aunt 80   Colon cancer Neg Hx    Esophageal cancer Neg Hx    Rectal cancer Neg Hx     [name] has a sister, Emily Ross, who was diagnosed with breast cancer at age 60. Her sister had genetic testing in the past year that was negative. [name] has two other sisters and one brother, none of whom have had a diagnosis of cancer.  [name] has two maternal aunts who had breast cancer, both diagnosed in their 28s, who both passed away of unrelated causes in their 17s. She has nine other maternal aunts and uncles and multiple cousins, but does not know of any other cancer diagnoses on her maternal side.  [name]'s father was diagnosed with a "very rare" form of stomach cancer when he was 52. There are no other known cancer diagnoses on her paternal side. Of  note, her paternal grandmother died in her 21Y due to complications during childbirth.  [name] is unaware of any other family history of genetic testing for hereditary cancer risks.  [name]'s maternal ancestors are of African American descent, and paternal ancestors are of African American descent. There is no reported Ashkenazi Jewish ancestry. There is no known consanguinity.    GENETIC TEST RESULTS: Genetic testing reported  out on 04/14/2019 through the [facility] Breast Cancer STAT panel found no pathogenic mutations. The STAT Breast cancer panel offered by [facility] includes sequencing and rearrangement analysis for the following 9 genes:  ATM, BRCA1, BRCA2, CDH1, CHEK2, PALB2, PTEN, STK11 and TP53. The test report has been scanned into EPIC and is located under the Molecular Pathology section of the Results Review tab.  A portion of the result report is included below for reference.     We discussed with [name] that because current genetic testing is not perfect, it is possible there may be a gene mutation in one of these genes that current testing cannot detect, but that chance is small.  We also discussed, that there could be another gene that has not yet been discovered, or that we have not yet tested, that is responsible for the cancer diagnoses in the family. It is also possible there is a hereditary cause for the cancer in the family that [name] did not inherit and therefore was not identified in her testing.  Therefore, it is important to remain in touch with cancer genetics in the future so that we can continue to offer [name] the most up to date genetic testing.   ADDITIONAL GENETIC TESTING: We discussed with [name] that there are other genes that are associated with increased cancer risk that can be analyzed. Should [name] wish to pursue additional genetic testing, we are happy to discuss and coordinate this testing, at any time.    CANCER SCREENING RECOMMENDATIONS: [name]'s test result is considered negative (normal).  This means that we have not identified a hereditary cause for her personal and family history of cancer at this time. Most cancers happen by chance and this negative test suggests that her cancer may fall into this category.    While reassuring, this does not definitively rule out a hereditary predisposition to cancer. It is still possible that there could be genetic mutations  that are undetectable by current technology. There could be genetic mutations in genes that have not been tested or identified to increase cancer risk.  Therefore, it is recommended she continue to follow the cancer management and screening guidelines provided by her oncology and primary healthcare provider.   An individual's cancer risk and medical management are not determined by genetic test results alone. Overall cancer risk assessment incorporates additional factors, including personal medical history, family history, and any available genetic information that may result in a personalized plan for cancer prevention and surveillance  RECOMMENDATIONS FOR FAMILY MEMBERS:  Individuals in this family might be at some increased risk of developing cancer, over the general population risk, simply due to the family history of cancer.  We recommended women in this family have a yearly mammogram beginning at age 58, or 33 years younger than the earliest onset of cancer, an annual clinical breast exam, and perform monthly breast self-exams. Women in this family should also have a gynecological exam as recommended by their primary provider. All family members should have a colonoscopy by age 37.  FOLLOW-UP: Lastly, we discussed  with [name] that cancer genetics is a rapidly advancing field and it is possible that new genetic tests will be appropriate for her and/or her family members in the future. We encouraged her to remain in contact with cancer genetics on an annual basis so we can update her personal and family histories and let her know of advances in cancer genetics that may benefit this family.   Our contact number was provided. [name]'s questions were answered to her satisfaction, and she knows she is welcome to call us at anytime with additional questions or concerns.   Clint Guy, MS Genetic Counselor [email or web address] Phone: [phone]

## 2019-04-15 NOTE — Telephone Encounter (Signed)
Revealed negative genetic testing.  Discussed that we do not know why she has breast cancer or why there is cancer in the family. It could be due to a different gene that we are not testing, or maybe our current technology may not be able to pick something up.  It will be important for her to keep in contact with genetics to keep up with whether additional testing may be needed. 

## 2019-04-15 NOTE — Telephone Encounter (Signed)
Nutrition  Patient new diagnosis of breast cancer.  Patient attended [facility] on 7/8 and nutrition packet was received.    Called to introduce self and service at [facility].  Left message with call back number.  Jack Bolio B. Zenia Resides, [place], [place] Registered Dietitian [phone] (pager)

## 2019-04-16 ENCOUNTER — Encounter: Payer: 59 | Admitting: Nurse Practitioner

## 2019-04-16 ENCOUNTER — Telehealth: Payer: Self-pay

## 2019-04-16 NOTE — Telephone Encounter (Signed)
Nutrition  Patient returned RD's phone call.    Patient did receive the nutrition packet given on 7/8 and has reviewed the information.  Questions answered.  Patient appreciative of call and will reach out to RD with questions or concerns.    Emily Ross B. Zenia Resides, [place], [place] Registered Dietitian [phone] (pager)

## 2019-04-17 ENCOUNTER — Ambulatory Visit (HOSPITAL_COMMUNITY): Payer: 59 | Admitting: Certified Registered Nurse Anesthetist

## 2019-04-17 ENCOUNTER — Encounter (HOSPITAL_COMMUNITY): Payer: Self-pay | Admitting: Certified Registered"

## 2019-04-17 ENCOUNTER — Ambulatory Visit (HOSPITAL_COMMUNITY): Payer: 59

## 2019-04-17 ENCOUNTER — Ambulatory Visit (HOSPITAL_COMMUNITY)
Admission: RE | Admit: 2019-04-17 | Discharge: 2019-04-17 | Disposition: A | Discharge status: Home / Self Care | Payer: 59 | Source: Ambulatory Visit | Attending: General Surgery | Admitting: General Surgery

## 2019-04-17 ENCOUNTER — Encounter (HOSPITAL_COMMUNITY)
Admission: RE | Disposition: A | Discharge status: Home / Self Care | Payer: Self-pay | Source: Home / Self Care | Attending: General Surgery

## 2019-04-17 ENCOUNTER — Ambulatory Visit (HOSPITAL_COMMUNITY)
Admission: RE | Admit: 2019-04-17 | Discharge: 2019-04-17 | Disposition: A | Discharge status: Home / Self Care | Payer: 59 | Attending: General Surgery | Admitting: General Surgery

## 2019-04-17 DIAGNOSIS — E785 Hyperlipidemia, unspecified: Secondary | ICD-10-CM | POA: Insufficient documentation

## 2019-04-17 DIAGNOSIS — C50411 Malignant neoplasm of upper-outer quadrant of right female breast: Secondary | ICD-10-CM | POA: Diagnosis not present

## 2019-04-17 DIAGNOSIS — Z841 Family history of disorders of kidney and ureter: Secondary | ICD-10-CM | POA: Insufficient documentation

## 2019-04-17 DIAGNOSIS — Z923 Personal history of irradiation: Secondary | ICD-10-CM | POA: Diagnosis not present

## 2019-04-17 DIAGNOSIS — Z823 Family history of stroke: Secondary | ICD-10-CM | POA: Diagnosis not present

## 2019-04-17 DIAGNOSIS — Z8261 Family history of arthritis: Secondary | ICD-10-CM | POA: Insufficient documentation

## 2019-04-17 DIAGNOSIS — Z170 Estrogen receptor positive status [ER+]: Secondary | ICD-10-CM

## 2019-04-17 DIAGNOSIS — Z803 Family history of malignant neoplasm of breast: Secondary | ICD-10-CM | POA: Diagnosis not present

## 2019-04-17 DIAGNOSIS — Z8349 Family history of other endocrine, nutritional and metabolic diseases: Secondary | ICD-10-CM | POA: Diagnosis not present

## 2019-04-17 DIAGNOSIS — Z832 Family history of diseases of the blood and blood-forming organs and certain disorders involving the immune mechanism: Secondary | ICD-10-CM | POA: Diagnosis not present

## 2019-04-17 DIAGNOSIS — Z833 Family history of diabetes mellitus: Secondary | ICD-10-CM | POA: Insufficient documentation

## 2019-04-17 DIAGNOSIS — I10 Essential (primary) hypertension: Secondary | ICD-10-CM | POA: Diagnosis not present

## 2019-04-17 DIAGNOSIS — C50911 Malignant neoplasm of unspecified site of right female breast: Secondary | ICD-10-CM | POA: Diagnosis present

## 2019-04-17 DIAGNOSIS — Z95828 Presence of other vascular implants and grafts: Secondary | ICD-10-CM

## 2019-04-17 HISTORY — PX: AXILLARY SENTINEL NODE BIOPSY: SHX5738

## 2019-04-17 HISTORY — PX: PORTACATH PLACEMENT: SHX2246

## 2019-04-17 HISTORY — PX: BREAST LUMPECTOMY WITH RADIOACTIVE SEED AND SENTINEL LYMPH NODE BIOPSY: SHX6550

## 2019-04-17 IMAGING — DX PORTABLE CHEST - 1 VIEW
1 series · 1 of 1 positions shown · non-contrast
Comparison: [DATE] and earlier.

CLINICAL DATA: Port-A-Cath placement intraoperatively.

EXAM:
PORTABLE CHEST 1 VIEW

[chest]
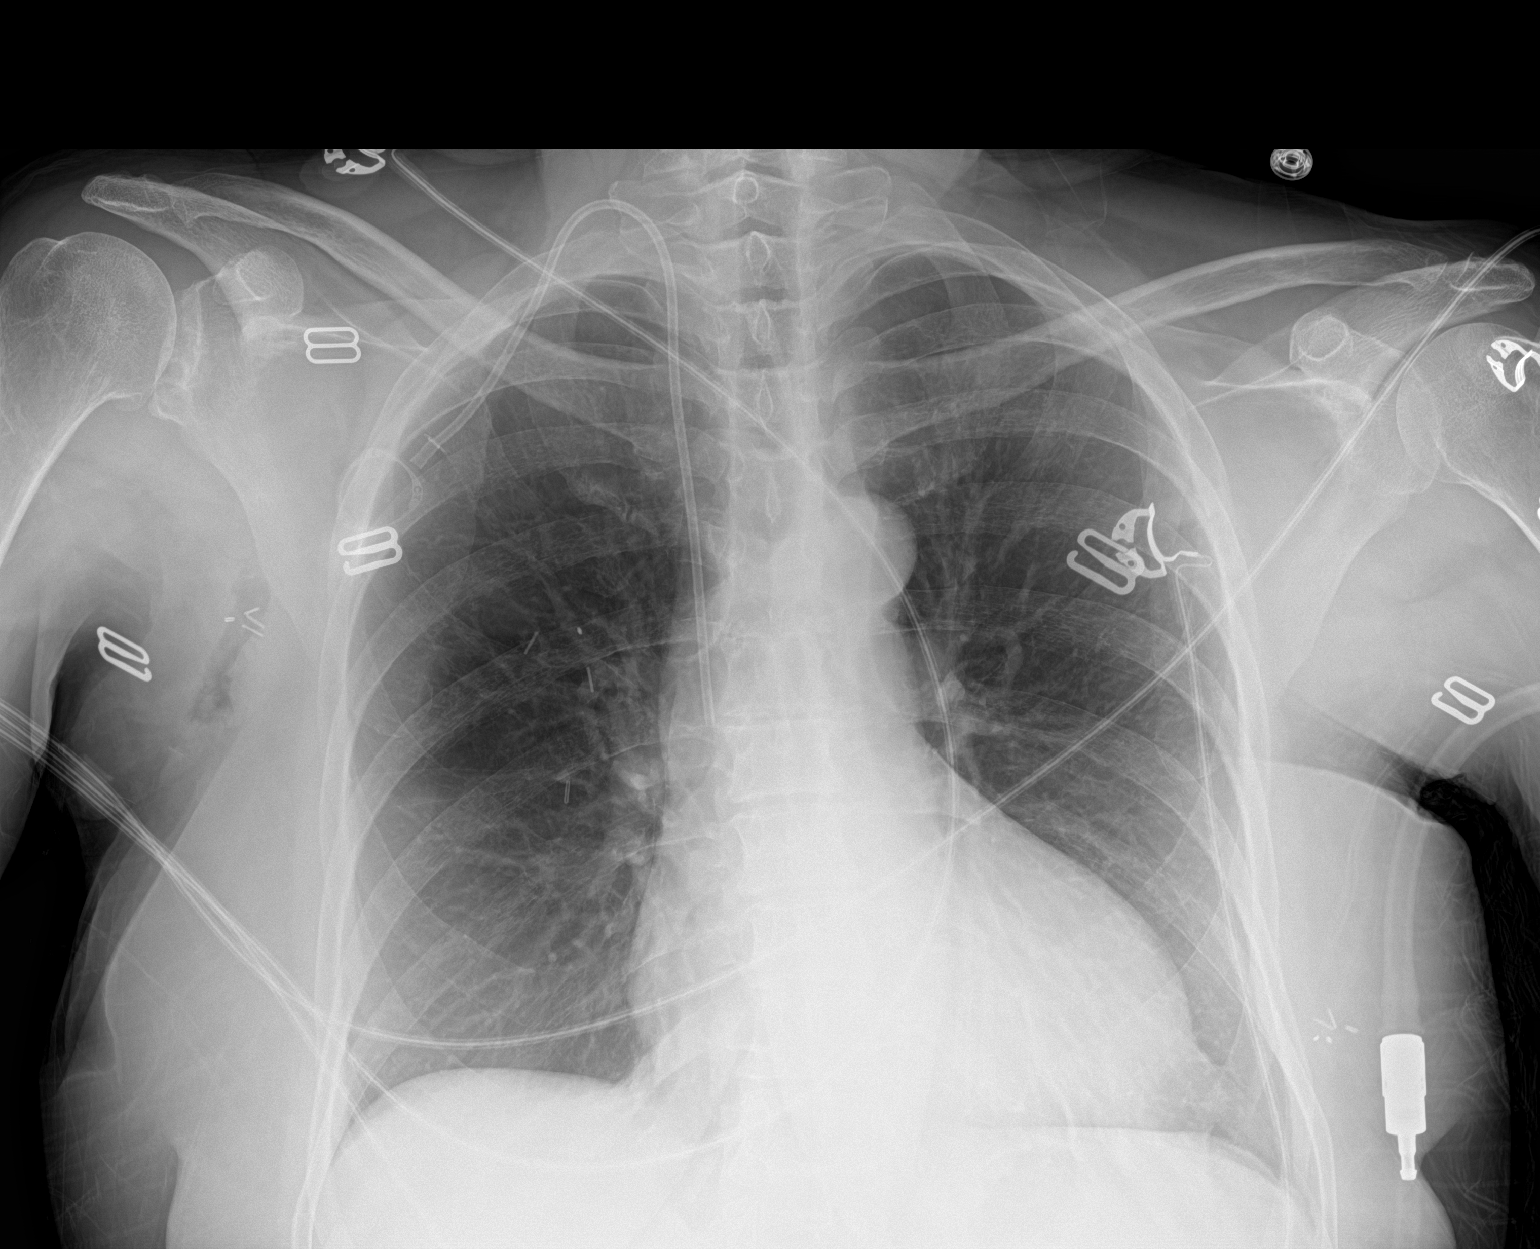

[1 of 1 positions shown; findings below may reference images not displayed]

FINDINGS: RIGHT jugular Port-A-Cath tip projects over the LOWER SVC. No
evidence of pneumothorax or mediastinal hematoma.

Cardiomediastinal silhouette unremarkable and unchanged. Lungs
clear. Bronchovascular markings normal. Pulmonary vascularity
normal. No visible pleural effusions. No pneumothorax. Surgical
clips in the RIGHT axilla from node dissection.
IMPRESSION: 1. RIGHT jugular Port-A-Cath tip projects over the LOWER SVC. No
acute complicating features.
2. No acute cardiopulmonary disease.

## 2019-04-17 SURGERY — BREAST LUMPECTOMY WITH RADIOACTIVE SEED AND SENTINEL LYMPH NODE BIOPSY
Anesthesia: Regional | Site: Chest | Laterality: Right

## 2019-04-17 MED ORDER — SODIUM CHLORIDE 0.9 % IV SOLN: 250.0000 mL | INTRAVENOUS | Status: DC | PRN

## 2019-04-17 MED ORDER — CEFAZOLIN SODIUM-DEXTROSE 2-4 GM/100ML-% IV SOLN
2.0000 g | INTRAVENOUS | Status: AC
  Administered 2019-04-17: 2 g via INTRAVENOUS

## 2019-04-17 MED ORDER — SUCCINYLCHOLINE CHLORIDE 200 MG/10ML IV SOSY
PREFILLED_SYRINGE | INTRAVENOUS | Status: AC
  Filled 2019-04-17: qty 10

## 2019-04-17 MED ORDER — HEPARIN SOD (PORK) LOCK FLUSH 100 UNIT/ML IV SOLN
INTRAVENOUS | Status: DC | PRN
  Administered 2019-04-17: 500 [IU]

## 2019-04-17 MED ORDER — ACETAMINOPHEN 500 MG PO TABS
ORAL_TABLET | ORAL | Status: AC
  Administered 2019-04-17: 1000 mg via ORAL
  Filled 2019-04-17: qty 2

## 2019-04-17 MED ORDER — ONDANSETRON HCL 4 MG/2ML IJ SOLN
INTRAMUSCULAR | Status: DC | PRN
  Administered 2019-04-17: 4 mg via INTRAVENOUS

## 2019-04-17 MED ORDER — ACETAMINOPHEN 650 MG RE SUPP: 650.0000 mg | RECTAL | Status: DC | PRN

## 2019-04-17 MED ORDER — LACTATED RINGERS IV SOLN
INTRAVENOUS | Status: DC | PRN
  Administered 2019-04-17 (×2): via INTRAVENOUS

## 2019-04-17 MED ORDER — LIDOCAINE 2% (20 MG/ML) 5 ML SYRINGE
INTRAMUSCULAR | Status: AC
  Filled 2019-04-17: qty 5

## 2019-04-17 MED ORDER — GLYCOPYRROLATE PF 0.2 MG/ML IJ SOSY
PREFILLED_SYRINGE | INTRAMUSCULAR | Status: DC | PRN
  Administered 2019-04-17 (×2): .1 mg via INTRAVENOUS

## 2019-04-17 MED ORDER — BUPIVACAINE-EPINEPHRINE 0.25% -1:200000 IJ SOLN
INTRAMUSCULAR | Status: DC | PRN
  Administered 2019-04-17: 6 mL
  Administered 2019-04-17: 4 mL

## 2019-04-17 MED ORDER — FENTANYL CITRATE (PF) 100 MCG/2ML IJ SOLN
100.0000 ug | Freq: Once | INTRAMUSCULAR | Status: AC
  Administered 2019-04-17: 14:00:00 100 ug via INTRAVENOUS

## 2019-04-17 MED ORDER — FENTANYL CITRATE (PF) 100 MCG/2ML IJ SOLN
INTRAMUSCULAR | Status: AC
  Administered 2019-04-17: 100 ug via INTRAVENOUS
  Filled 2019-04-17: qty 2

## 2019-04-17 MED ORDER — SODIUM CHLORIDE (PF) 0.9 % IJ SOLN
INTRAMUSCULAR | Status: AC
  Filled 2019-04-17: qty 10

## 2019-04-17 MED ORDER — SODIUM CHLORIDE 0.9 % IV SOLN
INTRAVENOUS | Status: DC | PRN
  Administered 2019-04-17: 500 mL

## 2019-04-17 MED ORDER — EPHEDRINE 5 MG/ML INJ
INTRAVENOUS | Status: AC
  Filled 2019-04-17: qty 10

## 2019-04-17 MED ORDER — HEPARIN SOD (PORK) LOCK FLUSH 100 UNIT/ML IV SOLN
INTRAVENOUS | Status: AC
  Filled 2019-04-17: qty 5

## 2019-04-17 MED ORDER — MIDAZOLAM HCL 2 MG/2ML IJ SOLN
2.0000 mg | Freq: Once | INTRAMUSCULAR | Status: AC
  Administered 2019-04-17: 2 mg via INTRAVENOUS

## 2019-04-17 MED ORDER — ACETAMINOPHEN 500 MG PO TABS
1000.0000 mg | ORAL_TABLET | ORAL | Status: AC
  Administered 2019-04-17: 13:00:00 1000 mg via ORAL

## 2019-04-17 MED ORDER — OXYCODONE HCL 5 MG PO TABS: 5.0000 mg | ORAL_TABLET | Freq: Four times a day (QID) | ORAL | 0 refills | Status: AC | PRN

## 2019-04-17 MED ORDER — LIDOCAINE 2% (20 MG/ML) 5 ML SYRINGE
INTRAMUSCULAR | Status: DC | PRN
  Administered 2019-04-17: 60 mg via INTRAVENOUS

## 2019-04-17 MED ORDER — MIDAZOLAM HCL 2 MG/2ML IJ SOLN
INTRAMUSCULAR | Status: AC
  Administered 2019-04-17: 2 mg via INTRAVENOUS
  Filled 2019-04-17: qty 2

## 2019-04-17 MED ORDER — ACETAMINOPHEN 325 MG PO TABS: 650.0000 mg | ORAL_TABLET | ORAL | Status: DC | PRN

## 2019-04-17 MED ORDER — FENTANYL CITRATE (PF) 100 MCG/2ML IJ SOLN
INTRAMUSCULAR | Status: DC | PRN
  Administered 2019-04-17 (×2): 25 ug via INTRAVENOUS

## 2019-04-17 MED ORDER — PHENYLEPHRINE 40 MCG/ML (10ML) SYRINGE FOR IV PUSH (FOR BLOOD PRESSURE SUPPORT)
PREFILLED_SYRINGE | INTRAVENOUS | Status: DC | PRN
  Administered 2019-04-17 (×5): 80 ug via INTRAVENOUS

## 2019-04-17 MED ORDER — CEFAZOLIN SODIUM-DEXTROSE 2-4 GM/100ML-% IV SOLN: 2.0000 g | INTRAVENOUS | Status: DC

## 2019-04-17 MED ORDER — OXYCODONE HCL 5 MG PO TABS: 5.0000 mg | ORAL_TABLET | Freq: Four times a day (QID) | ORAL | 0 refills | Status: DC | PRN

## 2019-04-17 MED ORDER — MORPHINE SULFATE (PF) 2 MG/ML IV SOLN: 1.0000 mg | INTRAVENOUS | Status: DC | PRN

## 2019-04-17 MED ORDER — PROPOFOL 10 MG/ML IV BOLUS
INTRAVENOUS | Status: AC
  Filled 2019-04-17: qty 20

## 2019-04-17 MED ORDER — METHYLENE BLUE 0.5 % INJ SOLN
INTRAVENOUS | Status: AC
  Filled 2019-04-17: qty 10

## 2019-04-17 MED ORDER — PHENYLEPHRINE 40 MCG/ML (10ML) SYRINGE FOR IV PUSH (FOR BLOOD PRESSURE SUPPORT)
PREFILLED_SYRINGE | INTRAVENOUS | Status: AC
  Filled 2019-04-17: qty 10

## 2019-04-17 MED ORDER — OXYCODONE HCL 5 MG PO TABS
5.0000 mg | ORAL_TABLET | ORAL | Status: DC | PRN
  Administered 2019-04-17: 10 mg via ORAL

## 2019-04-17 MED ORDER — GLYCOPYRROLATE PF 0.2 MG/ML IJ SOSY
PREFILLED_SYRINGE | INTRAMUSCULAR | Status: AC
  Filled 2019-04-17: qty 1

## 2019-04-17 MED ORDER — SODIUM CHLORIDE (PF) 0.9 % IJ SOLN
INTRAVENOUS | Status: DC | PRN
  Administered 2019-04-17: 5 mL

## 2019-04-17 MED ORDER — MIDAZOLAM HCL 2 MG/2ML IJ SOLN
INTRAMUSCULAR | Status: AC
  Filled 2019-04-17: qty 2

## 2019-04-17 MED ORDER — FENTANYL CITRATE (PF) 250 MCG/5ML IJ SOLN
INTRAMUSCULAR | Status: AC
  Filled 2019-04-17: qty 5

## 2019-04-17 MED ORDER — BUPIVACAINE-EPINEPHRINE (PF) 0.25% -1:200000 IJ SOLN
INTRAMUSCULAR | Status: AC
  Filled 2019-04-17: qty 30

## 2019-04-17 MED ORDER — MIDAZOLAM HCL 5 MG/5ML IJ SOLN
INTRAMUSCULAR | Status: DC | PRN
  Administered 2019-04-17: 1 mg via INTRAVENOUS

## 2019-04-17 MED ORDER — DEXAMETHASONE SODIUM PHOSPHATE 10 MG/ML IJ SOLN
INTRAMUSCULAR | Status: AC
  Filled 2019-04-17: qty 1

## 2019-04-17 MED ORDER — DEXAMETHASONE SODIUM PHOSPHATE 4 MG/ML IJ SOLN
INTRAMUSCULAR | Status: DC | PRN
  Administered 2019-04-17: 8 mg via INTRAVENOUS

## 2019-04-17 MED ORDER — SODIUM CHLORIDE 0.9 % IV SOLN: INTRAVENOUS | Status: DC

## 2019-04-17 MED ORDER — SODIUM CHLORIDE 0.9 % IV SOLN
INTRAVENOUS | Status: AC
  Filled 2019-04-17: qty 1.2

## 2019-04-17 MED ORDER — FENTANYL CITRATE (PF) 100 MCG/2ML IJ SOLN
INTRAMUSCULAR | Status: AC
  Administered 2019-04-17: 25 ug via INTRAVENOUS
  Filled 2019-04-17: qty 2

## 2019-04-17 MED ORDER — ONDANSETRON HCL 4 MG/2ML IJ SOLN
INTRAMUSCULAR | Status: AC
  Filled 2019-04-17: qty 2

## 2019-04-17 MED ORDER — PROPOFOL 10 MG/ML IV BOLUS
INTRAVENOUS | Status: DC | PRN
  Administered 2019-04-17: 120 mg via INTRAVENOUS

## 2019-04-17 MED ORDER — BUPIVACAINE-EPINEPHRINE (PF) 0.5% -1:200000 IJ SOLN
INTRAMUSCULAR | Status: DC | PRN
  Administered 2019-04-17: 30 mL via PERINEURAL

## 2019-04-17 MED ORDER — OXYCODONE HCL 5 MG PO TABS
ORAL_TABLET | ORAL | Status: AC
  Filled 2019-04-17: qty 2

## 2019-04-17 MED ORDER — CEFAZOLIN SODIUM-DEXTROSE 2-4 GM/100ML-% IV SOLN
INTRAVENOUS | Status: AC
  Filled 2019-04-17: qty 100

## 2019-04-17 MED ORDER — GABAPENTIN 100 MG PO CAPS
100.0000 mg | ORAL_CAPSULE | ORAL | Status: AC
  Administered 2019-04-17: 100 mg via ORAL
  Filled 2019-04-17: qty 1

## 2019-04-17 MED ORDER — LACTATED RINGERS IV SOLN
INTRAVENOUS | Status: DC
  Administered 2019-04-17: 13:00:00 via INTRAVENOUS

## 2019-04-17 MED ORDER — GABAPENTIN 300 MG PO CAPS
ORAL_CAPSULE | ORAL | Status: AC
  Administered 2019-04-17: 100 mg via ORAL
  Filled 2019-04-17: qty 1

## 2019-04-17 MED ORDER — SODIUM CHLORIDE 0.9% FLUSH: 3.0000 mL | Freq: Two times a day (BID) | INTRAVENOUS | Status: DC

## 2019-04-17 MED ORDER — 0.9 % SODIUM CHLORIDE (POUR BTL) OPTIME
TOPICAL | Status: DC | PRN
  Administered 2019-04-17: 1000 mL

## 2019-04-17 MED ORDER — HEMOSTATIC AGENTS (NO CHARGE) OPTIME
TOPICAL | Status: DC | PRN
  Administered 2019-04-17: 1 via TOPICAL

## 2019-04-17 MED ORDER — SODIUM CHLORIDE 0.9 % IV SOLN
INTRAVENOUS | Status: DC | PRN
  Administered 2019-04-17: 15:00:00 60 ug/min via INTRAVENOUS

## 2019-04-17 MED ORDER — FENTANYL CITRATE (PF) 100 MCG/2ML IJ SOLN
25.0000 ug | INTRAMUSCULAR | Status: DC | PRN
  Administered 2019-04-17: 25 ug via INTRAVENOUS

## 2019-04-17 MED ORDER — ENSURE PRE-SURGERY PO LIQD
296.0000 mL | Freq: Once | ORAL | Status: DC
  Filled 2019-04-17: qty 296

## 2019-04-17 MED ORDER — EPHEDRINE SULFATE-NACL 50-0.9 MG/10ML-% IV SOSY
PREFILLED_SYRINGE | INTRAVENOUS | Status: DC | PRN
  Administered 2019-04-17: 5 mg via INTRAVENOUS

## 2019-04-17 MED ORDER — SODIUM CHLORIDE 0.9% FLUSH: 3.0000 mL | INTRAVENOUS | Status: DC | PRN

## 2019-04-17 SURGICAL SUPPLY — 74 items
ADH SKN CLS APL DERMABOND .7 (GAUZE/BANDAGES/DRESSINGS) ×9
APL PRP STRL LF DISP 70% ISPRP (MISCELLANEOUS) ×3
APPLIER CLIP 9.375 MED OPEN (MISCELLANEOUS) ×4
APR CLP MED 9.3 20 MLT OPN (MISCELLANEOUS) ×3
BAG DECANTER FOR FLEXI CONT (MISCELLANEOUS) ×4 IMPLANT
BINDER BREAST LRG (GAUZE/BANDAGES/DRESSINGS) IMPLANT
BINDER BREAST XLRG (GAUZE/BANDAGES/DRESSINGS) ×1 IMPLANT
CANISTER SUCT 3000ML PPV (MISCELLANEOUS) ×4 IMPLANT
CHLORAPREP W/TINT 10.5 ML (MISCELLANEOUS) ×4 IMPLANT
CHLORAPREP W/TINT 26 (MISCELLANEOUS) ×4 IMPLANT
CLIP APPLIE 9.375 MED OPEN (MISCELLANEOUS) IMPLANT
CLIP VESOCCLUDE MED 6/CT (CLIP) ×4 IMPLANT
CLOSURE STERI-STRIP 1/4X4 (GAUZE/BANDAGES/DRESSINGS) ×1 IMPLANT
COVER PROBE W GEL 5X96 (DRAPES) ×4 IMPLANT
COVER SURGICAL LIGHT HANDLE (MISCELLANEOUS) ×4 IMPLANT
COVER TRANSDUCER ULTRASND GEL (DRAPE) ×5 IMPLANT
COVER ULTRASOUND SURGICAL (MISCELLANEOUS) IMPLANT
COVER WAND RF STERILE (DRAPES) ×4 IMPLANT
DECANTER SPIKE VIAL GLASS SM (MISCELLANEOUS) ×4 IMPLANT
DERMABOND ADVANCED (GAUZE/BANDAGES/DRESSINGS) ×3
DERMABOND ADVANCED .7 DNX12 (GAUZE/BANDAGES/DRESSINGS) ×3 IMPLANT
DEVICE DUBIN SPECIMEN MAMMOGRA (MISCELLANEOUS) ×4 IMPLANT
DRAPE C-ARM 42X72 X-RAY (DRAPES) ×4 IMPLANT
DRAPE CHEST BREAST 15X10 FENES (DRAPES) ×4 IMPLANT
ELECT CAUTERY BLADE 6.4 (BLADE) ×4 IMPLANT
ELECT COATED BLADE 2.86 ST (ELECTRODE) ×4 IMPLANT
ELECT REM PT RETURN 9FT ADLT (ELECTROSURGICAL) ×4
ELECTRODE REM PT RTRN 9FT ADLT (ELECTROSURGICAL) ×3 IMPLANT
GAUZE 4X4 16PLY RFD (DISPOSABLE) ×4 IMPLANT
GEL ULTRASOUND 20GR AQUASONIC (MISCELLANEOUS) ×4 IMPLANT
GLOVE BIO SURGEON STRL SZ7 (GLOVE) ×4 IMPLANT
GLOVE BIOGEL PI IND STRL 7.5 (GLOVE) ×3 IMPLANT
GLOVE BIOGEL PI INDICATOR 7.5 (GLOVE) ×1
GOWN STRL REUS W/ TWL LRG LVL3 (GOWN DISPOSABLE) ×6 IMPLANT
GOWN STRL REUS W/TWL LRG LVL3 (GOWN DISPOSABLE) ×8
HEMOSTAT ARISTA ABSORB 3G PWDR (HEMOSTASIS) ×1 IMPLANT
ILLUMINATOR WAVEGUIDE N/F (MISCELLANEOUS) ×1 IMPLANT
INTRODUCER COOK 11FR (CATHETERS) IMPLANT
KIT BASIN OR (CUSTOM PROCEDURE TRAY) ×4 IMPLANT
KIT MARKER MARGIN INK (KITS) ×4 IMPLANT
KIT PORT POWER 8FR ISP CVUE (Port) ×4 IMPLANT
KIT TURNOVER KIT B (KITS) ×4 IMPLANT
NDL 18GX1X1/2 (RX/OR ONLY) (NEEDLE) IMPLANT
NDL FILTER BLUNT 18X1 1/2 (NEEDLE) IMPLANT
NDL HYPO 25GX1X1/2 BEV (NEEDLE) ×3 IMPLANT
NEEDLE 18GX1X1/2 (RX/OR ONLY) (NEEDLE) ×4 IMPLANT
NEEDLE FILTER BLUNT 18X 1/2SAF (NEEDLE) ×1
NEEDLE FILTER BLUNT 18X1 1/2 (NEEDLE) ×3 IMPLANT
NEEDLE HYPO 25GX1X1/2 BEV (NEEDLE) ×4 IMPLANT
NS IRRIG 1000ML POUR BTL (IV SOLUTION) ×4 IMPLANT
PACK GENERAL/GYN (CUSTOM PROCEDURE TRAY) ×4 IMPLANT
PAD ARMBOARD 7.5X6 YLW CONV (MISCELLANEOUS) ×8 IMPLANT
PENCIL BUTTON HOLSTER BLD 10FT (ELECTRODE) ×4 IMPLANT
POSITIONER HEAD DONUT 9IN (MISCELLANEOUS) ×4 IMPLANT
SET INTRODUCER 12FR PACEMAKER (INTRODUCER) IMPLANT
SET SHEATH INTRODUCER 10FR (MISCELLANEOUS) IMPLANT
SHEATH COOK PEEL AWAY SET 9F (SHEATH) IMPLANT
STRIP CLOSURE SKIN 1/2X4 (GAUZE/BANDAGES/DRESSINGS) ×5 IMPLANT
SUT MNCRL AB 4-0 PS2 18 (SUTURE) ×9 IMPLANT
SUT MON AB 5-0 PS2 18 (SUTURE) IMPLANT
SUT PROLENE 2 0 SH DA (SUTURE) ×5 IMPLANT
SUT SILK 2 0 (SUTURE)
SUT SILK 2 0 SH (SUTURE) ×1 IMPLANT
SUT SILK 2-0 18XBRD TIE 12 (SUTURE) IMPLANT
SUT VIC AB 2-0 SH 27 (SUTURE) ×12
SUT VIC AB 2-0 SH 27XBRD (SUTURE) ×6 IMPLANT
SUT VIC AB 3-0 SH 27 (SUTURE) ×16
SUT VIC AB 3-0 SH 27X BRD (SUTURE) ×6 IMPLANT
SUT VIC AB 3-0 SH 27XBRD (SUTURE) ×3 IMPLANT
SYR 5ML LUER SLIP (SYRINGE) ×4 IMPLANT
SYR CONTROL 10ML LL (SYRINGE) ×4 IMPLANT
TOWEL GREEN STERILE (TOWEL DISPOSABLE) ×4 IMPLANT
TOWEL GREEN STERILE FF (TOWEL DISPOSABLE) ×4 IMPLANT
TRAY LAPAROSCOPIC MC (CUSTOM PROCEDURE TRAY) ×4 IMPLANT

## 2019-04-17 NOTE — Op Note (Addendum)
Preoperative diagnosis:Right breast cancer, clinical stage I, triple positive Postoperative diagnosis: Same as above Procedure: 1.Rightbreast radioactive seed guided lumpectomy 2. Right axillary sentinel lymph node biopsy 3.  Right IJ port placement with [place] Surgeon: Dr. Serita Grammes Anesthesia: General with a pectoral block Specimens: 1.Rightbreast tissue marked with paint 2.additional anterior margin of right breastlumpectomy marked short superior, long lateral, double deep 3.Rightaxillary sentinel nodes Estimated blood JHER:74 cc Complications: None Drains: None Sponge and count was correct at completion Disposition to recovery in stable condition  Indications: This is a60 yof with newly diagnosed right breast cancer. Biopsy is IDC.Nodes are negative. We discussed options and have elected to proceed with lumpectomy and sentinel node biopsy.discussed port placement at same time  Procedure: She first had the seed placed.I had these available for my review in the operating room. After for informed consent was obtained she first underwent a pectoral block and injection of technetium in the standard periareolar fashion. She was then placed under general anesthesia without complication. She was given antibiotics. SCDs were in place. She was then prepped and draped in the standard sterile surgical fashion. A surgical timeout was then performed.  I then identified the seed in theupper inner quadrant of the right breast. I elected to make an incision in the upper inner quadrant as the seed and tumor were close to the skin. Iinfiltrated Marcaine and made an incision. I then removedthe seed and the surrounding tissue with an attempt to get a clear margin. I did remove an additional anterior margin. Hemostasis was observed. I placed some clips. I did a mammogram in the operating room confirming removal of the clip and the seed.I eventually mobilized the breast tissue  and closed this with 2-0 Vicryl, 3-0 Vicryl, and 4-0 Monocryl.  I then entered into the axilla. I divided the fascia and entered into the axilla.  I then identified radioactive nodes and excised these. The counts were low in general. There was no more radioactivity. I the obtained hemostasis. I did place some Arista in the axilla. I closed this with 2-0 Vicryl, 3-0 Vicryl, and 4-0 Monocryl. Glue and Steri-Strips were applied.   I used the ultrasound to identify the right internal jugular vein. I then was able to access the internal jugular vein with a needle on the first pass. I then placed the wire. The wire was confirmed to be in the vein by ultrasound. I then did fluoroscopy and the wire was in the correct position as well. I then infiltrated Marcainein her upper right chest and made an incision. I developed a pocket below this.I then used the [employer] to create the tunnel and brought the line from the neck incision to the chest wall. I then placed the dilator over the wire under fluoroscopic vision. I removedthe wire assembly. I then placed a line through the dilator and removedthe peel-away sheath. I pulled this back to be in the superior vena cava. I then hooked this up to the port. I sutured the port down with 2-0 Prolene suture.  I then placed heparin in the port. I then closed this with 3-0 Vicryl, 4-0 Monocryl, and glue. She tolerated this well was extubated and transferred to the recovery room stable.

## 2019-04-17 NOTE — Discharge Instructions (Addendum)
[facility] °Office Phone Number [phone] ° °POST OP INSTRUCTIONS °Take 400 mg of ibuprofen every 8 hours or 650 mg tylenol every 6 hours for next 72 hours then as needed. Use ice several times daily also. °Always review your discharge instruction sheet given to you by the facility where your surgery was performed. ° °IF YOU HAVE DISABILITY OR FAMILY LEAVE FORMS, YOU MUST BRING THEM TO THE OFFICE FOR PROCESSING.  DO NOT GIVE THEM TO YOUR DOCTOR. ° °1. A prescription for pain medication may be given to you upon discharge.  Take your pain medication as prescribed, if needed.  If narcotic pain medicine is not needed, then you may take acetaminophen (Tylenol), naprosyn (Alleve) or ibuprofen (Advil) as needed. °2. Take your usually prescribed medications unless otherwise directed °3. If you need a refill on your pain medication, please contact your pharmacy.  They will contact our office to request authorization.  Prescriptions will not be filled after 5pm or on week-ends. °4. You should eat very light the first 24 hours after surgery, such as soup, crackers, pudding, etc.  Resume your normal diet the day after surgery. °5. Most patients will experience some swelling and bruising in the breast.  Ice packs and a good support bra will help.  Wear the breast binder provided or a sports bra for 72 hours day and night.  After that wear a sports bra during the day until you return to the office. Swelling and bruising can take several days to resolve.  °6. It is common to experience some constipation if taking pain medication after surgery.  Increasing fluid intake and taking a stool softener will usually help or prevent this problem from occurring.  A mild laxative (Milk of Magnesia or Miralax) should be taken according to package directions if there are no bowel movements after 48 hours. °7. Unless discharge instructions indicate otherwise, you may remove your bandages 48 hours after surgery and you may  shower at that time.  You may have steri-strips (small skin tapes) in place directly over the incision.  These strips should be left on the skin for 7-10 days and will come off on their own.  If your surgeon used skin glue on the incision, you may shower in 24 hours.  The glue will flake off over the next 2-3 weeks.  Any sutures or staples will be removed at the office during your follow-up visit. °8. ACTIVITIES:  You may resume regular daily activities (gradually increasing) beginning the next day.  Wearing a good support bra or sports bra minimizes pain and swelling.  You may have sexual intercourse when it is comfortable. °a. You may drive when you no longer are taking prescription pain medication, you can comfortably wear a seatbelt, and you can safely maneuver your car and apply brakes. °b. RETURN TO WORK:  ______________________________________________________________________________________ °9. You should see your doctor in the office for a follow-up appointment approximately two weeks after your surgery.  Your doctor’s nurse will typically make your follow-up appointment when she calls you with your pathology report.  Expect your pathology report 3-4 business days after your surgery.  You may call to check if you do not hear from us after three days. °10. OTHER INSTRUCTIONS: _______________________________________________________________________________________________ _____________________________________________________________________________________________________________________________________ °_____________________________________________________________________________________________________________________________________ °_____________________________________________________________________________________________________________________________________ ° °WHEN TO CALL DR WAKEFIELD: °1. Fever over 101.0 °2. Nausea and/or vomiting. °3. Extreme swelling or bruising. °4. Continued bleeding from  incision. °5. Increased pain, redness, or drainage from the incision. ° °The clinic staff is available to   answer your questions during regular business hours.  Please don’t hesitate to call and ask to speak to one of the nurses for clinical concerns.  If you have a medical emergency, go to the nearest emergency room or call 911.  A surgeon from [facility] is always on call at the hospital. ° °For further questions, please visit [email or web address] ° °

## 2019-04-17 NOTE — Anesthesia Procedure Notes (Signed)
Anesthesia Regional Block: Pectoralis block   Pre-Anesthetic Checklist: ,, timeout performed, Correct Patient, Correct Site, Correct Laterality, Correct Procedure, Correct Position, site marked, Risks and benefits discussed,  Surgical consent,  Pre-op evaluation,  At surgeon's request and post-op pain management  Laterality: Right  Prep: Maximum Sterile Barrier Precautions used, chloraprep       Needles:  Injection technique: Single-shot  Needle Type: Echogenic Stimulator Needle     Needle Length: 9cm  Needle Gauge: 22     Additional Needles:   Procedures:,,,, ultrasound used (permanent image in chart),,,,  Narrative:  Start time: 04/17/2019 1:44 PM End time: 04/17/2019 1:54 PM Injection made incrementally with aspirations every 5 mL.  Performed by: Personally  Anesthesiologist: Freddrick March, MD  Additional Notes: Monitors applied. No increased pain on injection. No increased resistance to injection. Injection made in 5cc increments. Good needle visualization. Patient tolerated procedure well.

## 2019-04-17 NOTE — Anesthesia Preprocedure Evaluation (Addendum)
Anesthesia Evaluation  Patient identified by MRN, date of birth, ID band Patient awake    Reviewed: Allergy & Precautions, NPO status , Patient's Chart, lab work & pertinent test results  Airway Mallampati: I  TM Distance: >3 FB Neck ROM: Full    Dental no notable dental hx. (+) Dental Advisory Given, Edentulous Upper,    Pulmonary neg pulmonary ROS,    Pulmonary exam normal breath sounds clear to auscultation       Cardiovascular hypertension, Pt. on medications negative cardio ROS Normal cardiovascular exam Rhythm:Regular Rate:Normal  HLD   Neuro/Psych negative neurological ROS  negative psych ROS   GI/Hepatic negative GI ROS, Neg liver ROS,   Endo/Other  negative endocrine ROS  Renal/GU negative Renal ROS  negative genitourinary   Musculoskeletal negative musculoskeletal ROS (+)   Abdominal   Peds negative pediatric ROS (+)  Hematology negative hematology ROS (+)   Anesthesia Other Findings   Reproductive/Obstetrics negative OB ROS                            Anesthesia Physical Anesthesia Plan  ASA: II  Anesthesia Plan: General and Regional   Post-op Pain Management:  Regional for Post-op pain   Induction: Intravenous  PONV Risk Score and Plan: 3 and Ondansetron, Dexamethasone and Midazolam  Airway Management Planned: LMA  Additional Equipment:   Intra-op Plan:   Post-operative Plan: Extubation in OR  Informed Consent: I have reviewed the patients History and Physical, chart, labs and discussed the procedure including the risks, benefits and alternatives for the proposed anesthesia with the patient or authorized representative who has indicated his/her understanding and acceptance.     Dental advisory given  Plan Discussed with: CRNA  Anesthesia Plan Comments:         Anesthesia Quick Evaluation

## 2019-04-17 NOTE — H&P (Signed)
63 yof referred by Dr Lindi Adie for new right breast cancer. she has prior history of left breast dcis treated with lumpectomy and radiotherapy in 2009. she has fh in her sister at age 63, I have taken care of her sister. she had genetics previously which would be brca that were negative. she has no mass or dc. she had screening mm that shows a tiny mass superior and posterior in the right breast. on [place] this is a 9x9x41m mass at 1 oclock. the axilla is negative by [place] biopsy is a grade II IDC that is triple pos with Ki of 15%. [place]. 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 Breast Mass; Local Excision  Left. Oral Surgery   Diagnostic Studies History (Tawni Pummel RN; 04/08/2019 7:24 AM) Colonoscopy  within last year Mammogram  within last year Pap Smear  1-5 years ago  Medication History (Tawni Pummel RN; 04/08/2019 7:25 AM) Medications Reconciled  Social History (Tawni Pummel RN; 04/08/2019 7:24 AM) Caffeine use  Coffee. No alcohol use  No drug use  Tobacco use  Never smoker.  Family History (Tawni Pummel RN; 04/08/2019 7:24 AM) Arthritis  Father. Bleeding disorder  Sister. Breast Cancer  Sister. Cerebrovascular Accident  Father, Mother. Diabetes Mellitus  Father, Mother, Sister. Hypertension  Father, Mother, Sister. Kidney Disease  Sister. Thyroid problems  Sister.  Pregnancy / Birth History (Tawni Pummel RN; 04/08/2019 7:24 AM) Age at menarche  89years. Age of menopause  535-54Gravida  3 Irregular periods  Maternal age  64-20Para  3  Other Problems (Tawni Pummel RN; 04/08/2019 7:24 AM) High blood pressure    Review of Systems (Sunday SpillersLedford RN; 04/08/2019 7:24 AM) General Not Present- Appetite Loss, Chills, Fatigue, Fever, Night Sweats, Weight Gain and Weight Loss. Skin Not Present-  Change in Wart/Mole, Dryness, Hives, Jaundice, New Lesions, Non-Healing Wounds, Rash and Ulcer. HEENT Not Present- Earache, Hearing Loss, Hoarseness, Nose Bleed, Oral Ulcers, Ringing in the Ears, Seasonal Allergies, Sinus Pain, Sore Throat, Visual Disturbances, Wears glasses/contact lenses and Yellow Eyes. Respiratory Not Present- Bloody sputum, Chronic Cough, Difficulty Breathing, Snoring and Wheezing. Breast Not Present- Breast Mass, Breast Pain, Nipple Discharge and Skin Changes. Cardiovascular Not Present- Chest Pain, Difficulty Breathing Lying Down, Leg Cramps, Palpitations, Rapid Heart Rate, Shortness of Breath and Swelling of Extremities. Gastrointestinal Not Present- Abdominal Pain, Bloating, Bloody Stool, Change in Bowel Habits, Chronic diarrhea, Constipation, Difficulty Swallowing, Excessive gas, Gets full quickly at meals, Hemorrhoids, Indigestion, Nausea, Rectal Pain and Vomiting. Female Genitourinary Not Present- Frequency, Nocturia, Painful Urination, Pelvic Pain and Urgency. Musculoskeletal Not Present- Back Pain, Joint Pain, Joint Stiffness, Muscle Pain, Muscle Weakness and Swelling of Extremities. Neurological Not Present- Decreased Memory, Fainting, Headaches, Numbness, Seizures, Tingling, Tremor, Trouble walking and Weakness. Psychiatric Not Present- Anxiety, Bipolar, Change in Sleep Pattern, Depression, Fearful and Frequent crying. Endocrine Present- Hot flashes. Not Present- Cold Intolerance, Excessive Hunger, Hair Changes, Heat Intolerance and New Diabetes. Hematology Not Present- Blood Thinners, Easy Bruising, Excessive bleeding, Gland problems, HIV and Persistent Infections.   Physical Exam (Rolm BookbinderMD; 04/09/2019 8:33 AM) General Mental Status-Alert. Orientation-Oriented X3. Head and Neck Trachea-midline. Thyroid Gland Characteristics - normal size and consistency. Eye Sclera/Conjunctiva - Bilateral-No scleral icterus. Chest and Lung Exam Chest and  lung exam reveals -quiet, even and easy respiratory effort with no use of accessory muscles. Breast  Nipples-No Discharge. Breast - Bilateral-Symmetric, Non Tender. Breast Lump-No Palpable Breast Mass. Cardiovascular Cardiovascular examination reveals -normal heart sounds, regular rate and rhythm with no murmurs. Abdomen Note: soft no hepatomegaly Neurologic Neurologic evaluation reveals -alert and oriented x 3 with no impairment of recent or remote memory. Lymphatic Head & Neck General Head & Neck Lymphatics: Bilateral - Description - Normal. Axillary General Axillary Region: Bilateral - Description - Normal. Note: no Keyes adenopathy   Assessment & Plan Rolm Bookbinder MD; 04/09/2019 8:33 AM)  BREAST CANCER OF UPPER-OUTER QUADRANT OF RIGHT FEMALE BREAST (C50.411) Story: Right breast seed guided lumpectomy, right axillary sentinel node biopsy, port placement, repeat genetic testing We discussed staging and pathophysiology of breast cancer for about an hour. we discussed all available treatment options. we discussed lumpectomy vs mastectomy. they are equivalent for local recurrence and survival. she is very amenable to lumpectomy. we discussed lumpectomy and pos margin rate needing re-excision. there is local recurrence rate with either one. we discussed seed placement and covid testing preop. we also discussed need for nodal assessment. this is best done with sentinel node biopsy. we discussed this procedure and small risk of lymphedema and shoulder dysfunction. we also discussed port placement at the same time to begin adjuvant chemotherapy.

## 2019-04-17 NOTE — Anesthesia Postprocedure Evaluation (Signed)
Anesthesia Post Note  Patient: Emily Ross  Procedure(s) Performed: RIGHT BREAST LUMPECTOMY WITH RADIOACTIVE SEED (Right Breast) INSERTION PORT-A-CATH WITH ULTRASOUND (Right Chest) Right Axillary Sentinel Node Biopsy (Right Axilla)     Patient location during evaluation: PACU Anesthesia Type: Regional and General Level of consciousness: awake and alert Pain management: pain level controlled Vital Signs Assessment: post-procedure vital signs reviewed and stable Respiratory status: spontaneous breathing, nonlabored ventilation, respiratory function stable and patient connected to face mask oxygen Cardiovascular status: blood pressure returned to baseline and stable Postop Assessment: no apparent nausea or vomiting Anesthetic complications: no    Last Vitals:  Vitals:   04/17/19 1400 04/17/19 1620  BP: (!) 117/53 (!) 140/59  Pulse: 64 80  Resp: 11 15  Temp:    SpO2: 100% 100%    Last Pain:  Vitals:   04/17/19 1640  TempSrc:   PainSc: 7                  Abdulahad Mederos,W. EDMOND

## 2019-04-17 NOTE — Anesthesia Procedure Notes (Signed)
Procedure Name: LMA Insertion Date/Time: 04/17/2019 2:35 PM Performed by: Orlie Dakin, CRNA Pre-anesthesia Checklist: Patient identified, Emergency Drugs available, Suction available and Patient being monitored Patient Re-evaluated:Patient Re-evaluated prior to induction Oxygen Delivery Method: Circle system utilized Preoxygenation: Pre-oxygenation with 100% oxygen Induction Type: IV induction LMA: LMA inserted LMA Size: 4.0 Tube type: Oral Number of attempts: 1 Placement Confirmation: positive ETCO2 Tube secured with: Tape Dental Injury: Teeth and Oropharynx as per pre-operative assessment

## 2019-04-17 NOTE — Transfer of Care (Signed)
Immediate Anesthesia Transfer of Care Note  Patient: Emily Ross  Procedure(s) Performed: RIGHT BREAST LUMPECTOMY WITH RADIOACTIVE SEED (Right Breast) INSERTION PORT-A-CATH WITH ULTRASOUND (Right Chest) Right Axillary Sentinel Node Biopsy (Right Axilla)  Patient Location: PACU  Anesthesia Type:General and Regional  Level of Consciousness: awake and patient cooperative  Airway & Oxygen Therapy: Patient Spontanous Breathing and Patient connected to face mask oxygen  Post-op Assessment: Report given to RN and Post -op Vital signs reviewed and stable  Post vital signs: Reviewed and stable  Last Vitals:  Vitals Value Taken Time  BP 140/59 04/17/19 1618  Temp    Pulse 81 04/17/19 1618  Resp 15 04/17/19 1618  SpO2 100 % 04/17/19 1618  Vitals shown include unvalidated device data.  Last Pain:  Vitals:   04/17/19 1300  TempSrc:   PainSc: 0-No pain         Complications: No apparent anesthesia complications

## 2019-04-20 ENCOUNTER — Encounter (HOSPITAL_COMMUNITY): Payer: Self-pay | Admitting: General Surgery

## 2019-04-21 ENCOUNTER — Encounter: Payer: Self-pay | Admitting: Nurse Practitioner

## 2019-04-21 ENCOUNTER — Ambulatory Visit: Payer: 59 | Admitting: Nurse Practitioner

## 2019-04-21 ENCOUNTER — Other Ambulatory Visit: Payer: Self-pay

## 2019-04-21 VITALS — BP 118/80 | HR 73 | Temp 98.7°F | Ht 64.2 in | Wt 159.6 lb

## 2019-04-21 DIAGNOSIS — Z9011 Acquired absence of right breast and nipple: Secondary | ICD-10-CM

## 2019-04-21 DIAGNOSIS — E7800 Pure hypercholesterolemia, unspecified: Secondary | ICD-10-CM | POA: Diagnosis not present

## 2019-04-21 DIAGNOSIS — I10 Essential (primary) hypertension: Secondary | ICD-10-CM | POA: Diagnosis not present

## 2019-04-21 DIAGNOSIS — C50411 Malignant neoplasm of upper-outer quadrant of right female breast: Secondary | ICD-10-CM

## 2019-04-21 DIAGNOSIS — R7303 Prediabetes: Secondary | ICD-10-CM

## 2019-04-21 DIAGNOSIS — Z0000 Encounter for general adult medical examination without abnormal findings: Secondary | ICD-10-CM | POA: Diagnosis not present

## 2019-04-21 DIAGNOSIS — Z170 Estrogen receptor positive status [ER+]: Secondary | ICD-10-CM

## 2019-04-21 LAB — POCT UA - MICROALBUMIN
Albumin/Creatinine Ratio, Urine, POC: 30
Creatinine, POC: 300 mg/dL
Microalbumin Ur, POC: 10 mg/L

## 2019-04-21 LAB — POCT URINALYSIS DIPSTICK
Bilirubin, UA: NEGATIVE
Blood, UA: NEGATIVE
Glucose, UA: NEGATIVE
Ketones, UA: NEGATIVE
Leukocytes, UA: NEGATIVE
Nitrite, UA: NEGATIVE
Protein, UA: NEGATIVE
Spec Grav, UA: 1.02 (ref 1.010–1.025)
Urobilinogen, UA: 0.2 E.U./dL
pH, UA: 8.5 — AB (ref 5.0–8.0)

## 2019-04-21 MED ORDER — IBUPROFEN 600 MG PO TABS: 600.0000 mg | ORAL_TABLET | Freq: Four times a day (QID) | ORAL | 2 refills | Status: DC | PRN

## 2019-04-21 MED ORDER — SIMVASTATIN 10 MG PO TABS: 10.0000 mg | ORAL_TABLET | Freq: Every evening | ORAL | 1 refills | Status: DC

## 2019-04-21 MED ORDER — HYDROCHLOROTHIAZIDE 25 MG PO TABS: 25.0000 mg | ORAL_TABLET | Freq: Every day | ORAL | 1 refills | Status: DC

## 2019-04-21 MED ORDER — METOPROLOL TARTRATE 50 MG PO TABS: 50.0000 mg | ORAL_TABLET | Freq: Two times a day (BID) | ORAL | 1 refills | Status: DC

## 2019-04-21 NOTE — Patient Instructions (Signed)
Health Maintenance, Female Adopting a healthy lifestyle and getting preventive care are important in promoting health and wellness. Ask your health care provider about:  The right schedule for you to have regular tests and exams.  Things you can do on your own to prevent diseases and keep yourself healthy. What should I know about diet, weight, and exercise? Eat a healthy diet   Eat a diet that includes plenty of vegetables, fruits, low-fat dairy products, and lean protein.  Do not eat a lot of foods that are high in solid fats, added sugars, or sodium. Maintain a healthy weight Body mass index (BMI) is used to identify weight problems. It estimates body fat based on height and weight. Your health care provider can help determine your BMI and help you achieve or maintain a healthy weight. Get regular exercise Get regular exercise. This is one of the most important things you can do for your health. Most adults should:  Exercise for at least 150 minutes each week. The exercise should increase your heart rate and make you sweat (moderate-intensity exercise).  Do strengthening exercises at least twice a week. This is in addition to the moderate-intensity exercise.  Spend less time sitting. Even light physical activity can be beneficial. Watch cholesterol and blood lipids Have your blood tested for lipids and cholesterol at 64 years of age, then have this test every 5 years. Have your cholesterol levels checked more often if:  Your lipid or cholesterol levels are high.  You are older than 64 years of age.  You are at high risk for heart disease. What should I know about cancer screening? Depending on your health history and family history, you may need to have cancer screening at various ages. This may include screening for:  Breast cancer.  Cervical cancer.  Colorectal cancer.  Skin cancer.  Lung cancer. What should I know about heart disease, diabetes, and high blood  pressure? Blood pressure and heart disease  High blood pressure causes heart disease and increases the risk of stroke. This is more likely to develop in people who have high blood pressure readings, are of African descent, or are overweight.  Have your blood pressure checked: ? Every 3-5 years if you are 18-39 years of age. ? Every year if you are 40 years old or older. Diabetes Have regular diabetes screenings. This checks your fasting blood sugar level. Have the screening done:  Once every three years after age 40 if you are at a normal weight and have a low risk for diabetes.  More often and at a younger age if you are overweight or have a high risk for diabetes. What should I know about preventing infection? Hepatitis B If you have a higher risk for hepatitis B, you should be screened for this virus. Talk with your health care provider to find out if you are at risk for hepatitis B infection. Hepatitis C Testing is recommended for:  Everyone born from 1945 through 1965.  Anyone with known risk factors for hepatitis C. Sexually transmitted infections (STIs)  Get screened for STIs, including gonorrhea and chlamydia, if: ? You are sexually active and are younger than 64 years of age. ? You are older than 64 years of age and your health care provider tells you that you are at risk for this type of infection. ? Your sexual activity has changed since you were last screened, and you are at increased risk for chlamydia or gonorrhea. Ask your health care provider if   you are at risk.  Ask your health care provider about whether you are at high risk for HIV. Your health care provider may recommend a prescription medicine to help prevent HIV infection. If you choose to take medicine to prevent HIV, you should first get tested for HIV. You should then be tested every 3 months for as long as you are taking the medicine. Pregnancy  If you are about to stop having your period (premenopausal) and  you may become pregnant, seek counseling before you get pregnant.  Take 400 to 800 micrograms (mcg) of folic acid every day if you become pregnant.  Ask for birth control (contraception) if you want to prevent pregnancy. Osteoporosis and menopause Osteoporosis is a disease in which the bones lose minerals and strength with aging. This can result in bone fractures. If you are 65 years old or older, or if you are at risk for osteoporosis and fractures, ask your health care provider if you should:  Be screened for bone loss.  Take a calcium or vitamin D supplement to lower your risk of fractures.  Be given hormone replacement therapy (HRT) to treat symptoms of menopause. Follow these instructions at home: Lifestyle  Do not use any products that contain nicotine or tobacco, such as cigarettes, e-cigarettes, and chewing tobacco. If you need help quitting, ask your health care provider.  Do not use street drugs.  Do not share needles.  Ask your health care provider for help if you need support or information about quitting drugs. Alcohol use  Do not drink alcohol if: ? Your health care provider tells you not to drink. ? You are pregnant, may be pregnant, or are planning to become pregnant.  If you drink alcohol: ? Limit how much you use to 0-1 drink a day. ? Limit intake if you are breastfeeding.  Be aware of how much alcohol is in your drink. In the [place] one drink equals one 12 oz bottle of beer (355 mL), one 5 oz glass of wine (148 mL), or one 1 oz glass of hard liquor (44 mL). General instructions  Schedule regular health, dental, and eye exams.  Stay current with your vaccines.  Tell your health care provider if: ? You often feel depressed. ? You have ever been abused or do not feel safe at home. Summary  Adopting a healthy lifestyle and getting preventive care are important in promoting health and wellness.  Follow your health care provider's instructions about healthy  diet, exercising, and getting tested or screened for diseases.  Follow your health care provider's instructions on monitoring your cholesterol and blood pressure. This information is not intended to replace advice given to you by your health care provider. Make sure you discuss any questions you have with your health care provider. Document Released: 04/02/2011 Document Revised: 09/10/2018 Document Reviewed: 09/10/2018 Elsevier Patient Education  2020 Elsevier Inc.  

## 2019-04-21 NOTE — Progress Notes (Signed)
Subjective:     Patient ID: Emily Ross , female    DOB: 1956/07/12 , 63 y.o.   MRN: [id number]   Chief Complaint  Patient presents with  . Annual Exam   The patient states she uses post menopausal status for birth control. Last LMP was Patient's last menstrual period was 12/03/2011.. Negative for Dysmenorrhea and Negative for Menorrhagia Mammogram last done 03/17/2019.  Negative for: breast discharge, breast lump(s), breast pain and breast self exam.  Pertinent negatives include abnormal bleeding (hematology), anxiety, decreased libido, depression, difficulty falling sleep, dyspareunia, history of infertility, nocturia, sexual dysfunction, sleep disturbances, urinary incontinence, urinary urgency, vaginal discharge and vaginal itching. Diet regular.The patient states her exercise level is  walking on the track daily at least 30- 1 hour.     . The patient's tobacco use is:  Social History   Tobacco Use  Smoking Status Never Smoker  Smokeless Tobacco Never Used  . She has been exposed to passive smoke. The patient's alcohol use is:  Social History   Substance and Sexual Activity  Alcohol Use No   Additional information: Last pap 04/07/2018 next one scheduled for 04/07/2021   HPI  Here for HM  Recently diagnosed with breast cancer to right breast - Dr. Rocky Crafts. Lindi Adie.  She had surgery on Friday for a lumpectomy.   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    Wt Readings from Last 3 Encounters: 04/21/19 : 159 lb 9.6 oz (72.4 kg) 04/17/19 : 161 lb 8 oz (73.3 kg) 04/13/19 : 161 lb 8 oz (73.3 kg)     Past Medical History:  Diagnosis Date  . Breast cancer ([place])   . Cancer [facility])    2009 left breast 2020 right breast  . Family history of breast cancer   . Family history of stomach cancer   . High cholesterol   . History of seasonal allergies   . Hypertension   . Pneumonia   . Wears glasses      Family History  Problem Relation Age of Onset  .  Diabetes Father   . Hypertension Father   . Stomach cancer Father 34  . Diabetes Mother   . Hypertension Mother   . Breast cancer Sister 27       negative genetic testing  . Breast cancer Maternal Aunt 80  . Colon cancer Neg Hx   . Esophageal cancer Neg Hx   . Rectal cancer Neg Hx      Current Outpatient Medications:  .  Ascorbic Acid (VITAMIN C PO), Take 1 tablet by mouth daily., Disp: , Rfl:  .  b complex vitamins tablet, Take 1 tablet by mouth daily., Disp: , Rfl:  .  Carboxymethylcellul-Glycerin (LUBRICATING EYE DROPS OP), Place 1 drop into both eyes daily as needed (dry eyes)., Disp: , Rfl:  .  Cholecalciferol (DIALYVITE VITAMIN D 5000) 125 MCG (5000 UT) capsule, Take 5,000 Units by mouth daily., Disp: , Rfl:  .  Coenzyme Q10 (CO Q-10 PO), Take 1 capsule by mouth daily., Disp: , Rfl:  .  diphenhydrAMINE (BENADRYL) 25 MG tablet, Take 25 mg by mouth daily as needed for allergies., Disp: , Rfl:  .  hydrochlorothiazide (HYDRODIURIL) 25 MG tablet, Take 1 tablet (25 mg total) by mouth daily., Disp: 90 tablet, Rfl: 0 .  ibuprofen (ADVIL) 200 MG tablet, Take 200 mg by mouth as needed., Disp: , Rfl:  .  metoprolol tartrate (LOPRESSOR) 50 MG tablet, Take 1 tablet (50 mg  total) by mouth 2 (two) times daily., Disp: 180 tablet, Rfl: 0 .  oxyCODONE (OXY IR/ROXICODONE) 5 MG immediate release tablet, Take 1 tablet (5 mg total) by mouth every 6 (six) hours as needed for moderate pain, severe pain or breakthrough pain., Disp: 10 tablet, Rfl: 0 .  Red Yeast Rice Extract (RED YEAST RICE PO), Take 2 capsules by mouth daily., Disp: , Rfl:  .  simvastatin (ZOCOR) 10 MG tablet, Take 1 tablet (10 mg total) by mouth every evening., Disp: 90 tablet, Rfl: 0 .  acetaminophen (TYLENOL) 500 MG tablet, Take 500 mg by mouth every 6 (six) hours as needed for moderate pain or headache., Disp: , Rfl:  .  oxyCODONE (ROXICODONE) 5 MG immediate release tablet, Take 1 tablet (5 mg total) by mouth every 6 (six) hours as  needed for up to 7 days (moderate, severe pain or breakthrough pain postop). (Patient not taking: Reported on 04/21/2019), Disp: 10 tablet, Rfl: 0   No Known Allergies   Review of Systems  Constitutional: Negative.   HENT: Negative.   Eyes: Negative.   Respiratory: Negative.   Cardiovascular: Negative.  Negative for chest pain, palpitations and leg swelling.  Gastrointestinal: Negative.   Endocrine: Negative.   Genitourinary: Negative.        Right breast surgery   Musculoskeletal: Negative.   Skin: Negative.   Allergic/Immunologic: Negative.   Neurological: Negative.   Hematological: Negative.   Psychiatric/Behavioral: Negative.      Today's Vitals   04/21/19 1450  BP: 118/80  Pulse: 73  Temp: 98.7 F (37.1 C)  TempSrc: Oral  Weight: 159 lb 9.6 oz (72.4 kg)  Height: 5' 4.2" (1.631 m)  PainSc: 0-No pain   Body mass index is 27.22 kg/m.   Objective:  Physical Exam Constitutional:      Appearance: Normal appearance. She is well-developed.  HENT:     Head: Normocephalic and atraumatic.     Right Ear: Hearing, tympanic membrane, ear canal and external ear normal.     Left Ear: Hearing, tympanic membrane, ear canal and external ear normal.  Eyes:     General: Lids are normal.     Extraocular Movements: Extraocular movements intact.     Conjunctiva/sclera: Conjunctivae normal.     Pupils: Pupils are equal, round, and reactive to light.     Funduscopic exam:    Right eye: No papilledema.        Left eye: No papilledema.  Neck:     Musculoskeletal: Full passive range of motion without pain, normal range of motion and neck supple.     Thyroid: No thyroid mass.     Vascular: No carotid bruit.  Cardiovascular:     Rate and Rhythm: Normal rate and regular rhythm.     Pulses: Normal pulses.     Heart sounds: Normal heart sounds. No murmur.  Pulmonary:     Effort: Pulmonary effort is normal. No respiratory distress.     Breath sounds: Normal breath sounds.  Abdominal:      General: Abdomen is flat. Bowel sounds are normal.     Palpations: Abdomen is soft.  Musculoskeletal: Normal range of motion.        General: No swelling.     Right lower leg: No edema.     Left lower leg: No edema.  Skin:    General: Skin is warm and dry.     Capillary Refill: Capillary refill takes less than 2 seconds.     Comments: Dressing  intact to right breast where had lumpectomy  Neurological:     General: No focal deficit present.     Mental Status: She is alert and oriented to person, place, and time.     Cranial Nerves: No cranial nerve deficit.     Sensory: No sensory deficit.  Psychiatric:        Mood and Affect: Mood normal.        Behavior: Behavior normal.        Thought Content: Thought content normal.        Judgment: Judgment normal.         Assessment And Plan:     1. Health maintenance examination . Behavior modifications discussed and diet history reviewed.   . Pt will continue to exercise regularly and modify diet with low GI, plant based foods and decrease intake of processed foods.  . Recommend intake of daily multivitamin, Vitamin D, and calcium.  . Recommend for preventive screenings, as well as recommend immunizations that include TDAP  2. Essential hypertension  Blood pressure is controlled  Continue with current medications  Will check for kidney functions - POCT Urinalysis Dipstick (81002) - POCT UA - Microalbumin - EKG 12-Lead - hydrochlorothiazide (HYDRODIURIL) 25 MG tablet; Take 1 tablet (25 mg total) by mouth daily.  Dispense: 90 tablet; Refill: 1 - metoprolol tartrate (LOPRESSOR) 50 MG tablet; Take 1 tablet (50 mg total) by mouth 2 (two) times daily.  Dispense: 180 tablet; Refill: 1  3. Elevated LDL cholesterol level  Chronic, controlled  Continue with current medications  No current concerns with muscle cramping - Lipid Profile - simvastatin (ZOCOR) 10 MG tablet; Take 1 tablet (10 mg total) by mouth every evening.   Dispense: 90 tablet; Refill: 1  4. Prediabetes  Chronic, controlled  No current medications  Encouraged to limit intake of sugary foods and drinks  Encouraged to increase physical activity to 150 minutes per week as tolerated once she is cleared by Oncology - Hemoglobin A1c  5. Malignant neoplasm of upper-outer quadrant of right breast in female, estrogen receptor positive ([place])  Right breast lumpectomy  Continue follow up with Dr Lindi Adie   Minette Brine, FNP    THE PATIENT IS ENCOURAGED TO PRACTICE SOCIAL DISTANCING DUE TO THE COVID-19 PANDEMIC.

## 2019-04-22 LAB — LIPID PANEL
Chol/HDL Ratio: 2.6 ratio (ref 0.0–4.4)
Cholesterol, Total: 170 mg/dL (ref 100–199)
HDL: 66 mg/dL (ref >39)
LDL Calculated: 92 mg/dL (ref 0–99)
Triglycerides: 59 mg/dL (ref 0–149)
VLDL Cholesterol Cal: 12 mg/dL (ref 5–40)

## 2019-04-22 LAB — HEMOGLOBIN A1C
Est. average glucose Bld gHb Est-mCnc: 117 mg/dL
Hgb A1c MFr Bld: 5.7 % — ABNORMAL HIGH (ref 4.8–5.6)

## 2019-04-22 NOTE — Assessment & Plan Note (Signed)
04/02/2019:Screening mammogram detected a mass in the right axilla. [place] confirmed a 0.9cm right breast mass. Biopsy showed IDC, grade 2, HER-2+ (3+), ER+ 100%, PR+ 5%, Ki67 15%.   Treatment plan: 1.  Breast conserving surgery with sentinel lymph node biopsy 2.  Adjuvant chemotherapy with Taxol Herceptin weekly x12 followed by Herceptin maintenance for 1 year 3.  Adjuvant radiation therapy 4.  Followed by adjuvant antiestrogen therapy with anastrozole 1 mg daily x5 to 7 years ------------------------------------------------------------------------------------------------------------------------------------------- Current treatment: Cycle 1 day 1 Taxol Herceptin Echocardiogram 04/24/2019: Labs reviewed Chemo education completed  Return to clinic in 1 week for toxicity check

## 2019-04-24 ENCOUNTER — Telehealth: Payer: Self-pay | Admitting: *Deleted

## 2019-04-24 ENCOUNTER — Ambulatory Visit (HOSPITAL_COMMUNITY)
Admission: RE | Admit: 2019-04-24 | Discharge: 2019-04-24 | Disposition: A | Discharge status: Home / Self Care | Payer: 59 | Source: Ambulatory Visit | Attending: Hematology and Oncology | Admitting: Hematology and Oncology

## 2019-04-24 ENCOUNTER — Other Ambulatory Visit: Payer: Self-pay

## 2019-04-24 ENCOUNTER — Inpatient Hospital Stay: Payer: 59

## 2019-04-24 DIAGNOSIS — I119 Hypertensive heart disease without heart failure: Secondary | ICD-10-CM | POA: Diagnosis not present

## 2019-04-24 DIAGNOSIS — I071 Rheumatic tricuspid insufficiency: Secondary | ICD-10-CM | POA: Diagnosis not present

## 2019-04-24 DIAGNOSIS — Z170 Estrogen receptor positive status [ER+]: Secondary | ICD-10-CM | POA: Insufficient documentation

## 2019-04-24 DIAGNOSIS — C50411 Malignant neoplasm of upper-outer quadrant of right female breast: Secondary | ICD-10-CM | POA: Insufficient documentation

## 2019-04-24 NOTE — Progress Notes (Signed)
Patient Care Team: Minette Brine, [place] as PCP - General (General Practice) Mauro Kaufmann, RN as Oncology Nurse Navigator Rockwell Germany, RN as Oncology Nurse Navigator Nicholas Lose, MD as Consulting Physician (Hematology and Oncology) Rolm Bookbinder, MD as Consulting Physician (General Surgery) Eppie Gibson, MD as Attending Physician (Radiation Oncology)  DIAGNOSIS:    ICD-10-CM   1. Malignant neoplasm of upper-outer quadrant of right breast in female, estrogen receptor positive ([place])  C50.411    Z17.0     SUMMARY OF ONCOLOGIC HISTORY: Oncology History  Malignant neoplasm of upper-outer quadrant of right breast in female, estrogen receptor positive ([place])  04/02/2019 Initial Diagnosis   Screening mammogram detected a mass in the right axilla. [place] confirmed a 0.9cm right breast mass. Biopsy showed IDC, grade 2, HER-2+ (3+), ER+ 100%, PR+ 5%, Ki67 15%.    04/08/2019 Cancer Staging   Staging form: Breast, AJCC 8th Edition - Clinical stage from 04/08/2019: Stage IA (cT1b, cN0, cM0, G2, ER+, PR+, HER2+) - Signed by Nicholas Lose, MD on 04/08/2019   04/14/2019 Genetic Testing   Patient had genetic testing done for her personal and family history of breast cancer.  Negative genetic testing on the [facility] Breast Cancer STAT panel. The STAT Breast cancer panel offered by [facility] includes sequencing and rearrangement analysis for the following 9 genes:  ATM, BRCA1, BRCA2, CDH1, CHEK2, PALB2, PTEN, STK11 and TP53.  The report date is 04/14/2019.   04/17/2019 Surgery   Right lumpectomy [name]): IDC, 2.0cm, grade 2, clear margins, 5 axillary lymph nodes negative for carcinoma   Carcinoma of upper-inner quadrant of right breast in female, estrogen receptor positive ([place])  04/08/2019 Cancer Staging   Staging form: Breast, AJCC 8th Edition - Clinical stage from 04/08/2019: Stage IA (cT1b, cN0, cM0, G2, ER+, PR+, HER2+) - Signed by Eppie Gibson, MD on 04/08/2019     CHIEF COMPLIANT: Follow-up  after surgery to discuss pathology report  INTERVAL HISTORY: Emily Ross is a 63 y.o. with above-mentioned history of right breast cancer. She underwent a lumpectomy and port placement on 04/17/19 with Dr. [name], which showed 2.0cm, grade 2, IDC, clear margins, and 5 axillary lymph nodes negative for carcinoma. Genetic testing was negative. Echo on 04/24/19 showed an ejection fraction in the range of 60-65%. She presents to the clinic today to discuss the final pathology report and to come up with the treatment plan for adjuvant therapy.  She is still very sore from the surgery but is healing very well.  REVIEW OF SYSTEMS:   Constitutional: Denies fevers, chills or abnormal weight loss Eyes: Denies blurriness of vision Ears, nose, mouth, throat, and face: Denies mucositis or sore throat Respiratory: Denies cough, dyspnea or wheezes Cardiovascular: Denies palpitation, chest discomfort Gastrointestinal: Denies nausea, heartburn or change in bowel habits Skin: Denies abnormal skin rashes Lymphatics: Denies new lymphadenopathy or easy bruising Neurological: Denies numbness, tingling or new weaknesses Behavioral/Psych: Mood is stable, no new changes  Extremities: No lower extremity edema Breast: Right lumpectomy and right-sided port All other systems were reviewed with the patient and are negative.  I have reviewed the past medical history, past surgical history, social history and family history with the patient and they are unchanged from previous note.  ALLERGIES:  has No Known Allergies.  MEDICATIONS:  Current Outpatient Medications  Medication Sig Dispense Refill  . acetaminophen (TYLENOL) 500 MG tablet Take 500 mg by mouth every 6 (six) hours as needed for moderate pain or headache.    . Ascorbic  Acid (VITAMIN C PO) Take 1 tablet by mouth daily.    Marland Kitchen b complex vitamins tablet Take 1 tablet by mouth daily.    . Carboxymethylcellul-Glycerin (LUBRICATING EYE DROPS OP) Place 1 drop  into both eyes daily as needed (dry eyes).    . Cholecalciferol (DIALYVITE VITAMIN D 5000) 125 MCG (5000 UT) capsule Take 5,000 Units by mouth daily.    . Coenzyme Q10 (CO Q-10 PO) Take 1 capsule by mouth daily.    . diphenhydrAMINE (BENADRYL) 25 MG tablet Take 25 mg by mouth daily as needed for allergies.    . hydrochlorothiazide (HYDRODIURIL) 25 MG tablet Take 1 tablet (25 mg total) by mouth daily. 90 tablet 1  . ibuprofen (ADVIL) 600 MG tablet Take 1 tablet (600 mg total) by mouth every 6 (six) hours as needed. 30 tablet 2  . metoprolol tartrate (LOPRESSOR) 50 MG tablet Take 1 tablet (50 mg total) by mouth 2 (two) times daily. 180 tablet 1  . oxyCODONE (OXY IR/ROXICODONE) 5 MG immediate release tablet Take 1 tablet (5 mg total) by mouth every 6 (six) hours as needed for moderate pain, severe pain or breakthrough pain. 10 tablet 0  . Red Yeast Rice Extract (RED YEAST RICE PO) Take 2 capsules by mouth daily.    . simvastatin (ZOCOR) 10 MG tablet Take 1 tablet (10 mg total) by mouth every evening. 90 tablet 1   No current facility-administered medications for this visit.     PHYSICAL EXAMINATION: ECOG PERFORMANCE STATUS: 1 - Symptomatic but completely ambulatory  Vitals:   04/27/19 1507  BP: 135/74  Pulse: 79  Resp: 18  Temp: 98.3 F (36.8 C)  SpO2: 98%   Filed Weights   04/27/19 1507  Weight: 163 lb 11.2 oz (74.3 kg)    GENERAL: alert, no distress and comfortable SKIN: skin color, texture, turgor are normal, no rashes or significant lesions EYES: normal, Conjunctiva are pink and non-injected, sclera clear OROPHARYNX: no exudate, no erythema and lips, buccal mucosa, and tongue normal  NECK: supple, thyroid normal size, non-tender, without nodularity LYMPH: no palpable lymphadenopathy in the cervical, axillary or inguinal LUNGS: clear to auscultation and percussion with normal breathing effort HEART: regular rate & rhythm and no murmurs and no lower extremity edema ABDOMEN:  abdomen soft, non-tender and normal bowel sounds MUSCULOSKELETAL: no cyanosis of digits and no clubbing  NEURO: alert & oriented x 3 with fluent speech, no focal motor/sensory deficits EXTREMITIES: No lower extremity edema    LABORATORY DATA:  I have reviewed the data as listed CMP Latest Ref Rng & Units 04/08/2019 10/28/2018 01/03/2012  Glucose 70 - 99 mg/dL 96 91 88  BUN 8 - 23 mg/dL 9 13 11   Creatinine 0.44 - 1.00 mg/dL 0.92 0.83 0.70  Sodium 135 - 145 mmol/L 140 141 140  Potassium 3.5 - 5.1 mmol/L 4.1 4.3 4.2  Chloride 98 - 111 mmol/L 100 99 103  CO2 22 - 32 mmol/L 31 27 31   Calcium 8.9 - 10.3 mg/dL 10.1 10.3 10.2  Total Protein 6.5 - 8.1 g/dL 7.9 7.1 -  Total Bilirubin 0.3 - 1.2 mg/dL 0.5 0.4 -  Alkaline Phos 38 - 126 U/L 67 66 -  AST 15 - 41 U/L 17 15 -  ALT 0 - 44 U/L 12 12 -    Lab Results  Component Value Date   WBC 4.7 04/08/2019   HGB 12.4 04/08/2019   HCT 38.2 04/08/2019   MCV 93.2 04/08/2019   PLT 299 04/08/2019  NEUTROABS 2.5 04/08/2019    ASSESSMENT & PLAN:  Malignant neoplasm of upper-outer quadrant of right breast in female, estrogen receptor positive ([place]) 04/02/2019:Screening mammogram detected a mass in the right axilla. [place] confirmed a 0.9cm right breast mass. Biopsy showed IDC, grade 2, HER-2+ (3+), ER+ 100%, PR+ 5%, Ki67 15%.   Treatment plan: 1.  Breast conserving surgery with sentinel lymph node biopsy 2.  Adjuvant chemotherapy with Taxol Herceptin weekly x12 followed by Herceptin maintenance for 1 year 3.  Adjuvant radiation therapy 4.  Followed by adjuvant antiestrogen therapy with anastrozole 1 mg daily x5 to 7 years ------------------------------------------------------------------------------------------------------------------------------------------- Current treatment: Cycle 1  Taxol Herceptin to start 05/15/2019 Echocardiogram 04/24/2019:: EF 60 to 65%   Pathology counseling: I discussed the final pathology report of the patient provided  a copy  of this report. I discussed the margins as well as lymph node surgeries. We also discussed the final staging along with previously performed ER/PR and HER-2/neu testing.  Return to clinic on the first day of her chemotherapy.    No orders of the defined types were placed in this encounter.  The patient has a good understanding of the overall plan. she agrees with it. she will call with any problems that may develop before the next visit here.  Nicholas Lose, MD 04/27/2019  Julious Oka Dorshimer am acting as scribe for Dr. Nicholas Lose.  I have reviewed the above documentation for accuracy and completeness, and I agree with the above.

## 2019-04-24 NOTE — Progress Notes (Signed)
  Echocardiogram 2D Echocardiogram has been performed.  Burnett Kanaris 04/24/2019, 10:50 AM

## 2019-04-24 NOTE — Telephone Encounter (Signed)
Pt came into office after her ECHO & telephone education.  Reviewed IRB Immunotherapy Study with pt & pt agreed to consent for study.  Consent signed & pt did pre-test, watched video & took post test.  We also did tour of treatment area.  Pt expressed appreciation for class & tour & said it helped her anxiety.

## 2019-04-27 ENCOUNTER — Other Ambulatory Visit: Payer: Self-pay

## 2019-04-27 ENCOUNTER — Telehealth: Payer: Self-pay | Admitting: Genetic Counselor

## 2019-04-27 ENCOUNTER — Inpatient Hospital Stay: Payer: 59 | Admitting: Hematology and Oncology

## 2019-04-27 VITALS — BP 135/74 | HR 79 | Temp 98.3°F | Resp 18 | Ht 64.2 in | Wt 163.7 lb

## 2019-04-27 DIAGNOSIS — C50411 Malignant neoplasm of upper-outer quadrant of right female breast: Secondary | ICD-10-CM | POA: Diagnosis not present

## 2019-04-27 DIAGNOSIS — Z803 Family history of malignant neoplasm of breast: Secondary | ICD-10-CM

## 2019-04-27 DIAGNOSIS — I10 Essential (primary) hypertension: Secondary | ICD-10-CM | POA: Diagnosis not present

## 2019-04-27 DIAGNOSIS — Z170 Estrogen receptor positive status [ER+]: Secondary | ICD-10-CM | POA: Diagnosis not present

## 2019-04-27 DIAGNOSIS — Z1379 Encounter for other screening for genetic and chromosomal anomalies: Secondary | ICD-10-CM

## 2019-04-27 DIAGNOSIS — Z79899 Other long term (current) drug therapy: Secondary | ICD-10-CM | POA: Diagnosis not present

## 2019-04-27 DIAGNOSIS — C50211 Malignant neoplasm of upper-inner quadrant of right female breast: Secondary | ICD-10-CM

## 2019-04-27 MED ORDER — ONDANSETRON HCL 8 MG PO TABS: 8.0000 mg | ORAL_TABLET | Freq: Two times a day (BID) | ORAL | 1 refills | Status: DC | PRN

## 2019-04-27 MED ORDER — PROCHLORPERAZINE MALEATE 10 MG PO TABS: 10.0000 mg | ORAL_TABLET | Freq: Four times a day (QID) | ORAL | 1 refills | Status: DC | PRN

## 2019-04-27 MED ORDER — LIDOCAINE-PRILOCAINE 2.5-2.5 % EX CREA: TOPICAL_CREAM | CUTANEOUS | 3 refills | Status: DC

## 2019-04-27 MED ORDER — LORAZEPAM 0.5 MG PO TABS: 0.5000 mg | ORAL_TABLET | Freq: Every evening | ORAL | 0 refills | Status: DC | PRN

## 2019-04-27 NOTE — Progress Notes (Signed)
START ON PATHWAY REGIMEN - Breast   Paclitaxel Weekly + Trastuzumab Weekly:   Administer weekly:     Paclitaxel      Trastuzumab-xxxx      Trastuzumab-xxxx   **Always confirm dose/schedule in your pharmacy ordering system**  Trastuzumab (Maintenance - NO Loading Dose):   A cycle is every 21 days:     Trastuzumab-xxxx   **Always confirm dose/schedule in your pharmacy ordering system**  Patient Characteristics: Postoperative without Neoadjuvant Therapy (Pathologic Staging), Invasive Disease, Adjuvant Therapy, HER2 Positive, ER Positive, Node Negative, pT1c, pN0/N1mi Therapeutic Status: Postoperative without Neoadjuvant Therapy (Pathologic Staging) AJCC Grade: G2 AJCC N Category: pN0 AJCC M Category: cM0 ER Status: Positive (+) AJCC 8 Stage Grouping: IA HER2 Status: Positive (+) Oncotype Dx Recurrence Score: Not Appropriate AJCC T Category: pT1c PR Status: Positive (+) Intent of Therapy: Curative Intent, Discussed with Patient 

## 2019-04-27 NOTE — Telephone Encounter (Signed)
Left voicemail to check in with Emily Ross regarding the possibility to perform additional genetic testing.

## 2019-04-28 ENCOUNTER — Encounter: Payer: Self-pay | Admitting: Medical Oncology

## 2019-04-28 ENCOUNTER — Encounter: Payer: Self-pay | Admitting: *Deleted

## 2019-04-28 ENCOUNTER — Telehealth: Payer: Self-pay | Admitting: Hematology and Oncology

## 2019-04-28 DIAGNOSIS — Z170 Estrogen receptor positive status [ER+]: Secondary | ICD-10-CM

## 2019-04-28 DIAGNOSIS — C50411 Malignant neoplasm of upper-outer quadrant of right female breast: Secondary | ICD-10-CM

## 2019-04-28 NOTE — Telephone Encounter (Signed)
I talk with patient regarding  Schedule

## 2019-04-28 NOTE — Progress Notes (Signed)
Clinical Social Work [place] Psychosocial Distress Screening [place]  Patient completed distress screening protocol and scored a 1 on the Psychosocial Distress Thermometer which indicates mild distress. Clinical Social Worker contacted patient at home after [facility] to assess for distress and other psychosocial needs. Patient stated she was doing well and had no concerns at this time. CSW and patient discussed common feeling and emotions when being diagnosed with cancer, and the importance of support during treatment. CSW informed patient of the support team and support services at [facility]. CSW provided contact information and encouraged patient to call with any questions or concerns.   ONCBCN DISTRESS SCREENING 04/28/2019  Screening Type Initial Screening  Distress experienced in past week (1-10) 1  Emotional problem type Adjusting to illness    Johnnye Lana, MSW, LCSW, OSW-C Clinical Social Worker [facility] [phone]

## 2019-04-28 NOTE — Progress Notes (Signed)
UPBEAT: Referral Dr. Lindi Adie referred patient to study. Patient had a virtual appointment with Dr. Lindi Adie yesterday and was introduced to the study by Dr. Lindi Adie. Patient expressed interest in study and Dr. Lindi Adie asked for research to reach out to her. I called patient this morning to introduce myself and speak to her more about the study. I spoke with patient and spouse. Patient confirms that Dr. Lindi Adie had given her a brief overview of the study and she has expressed interest in knowing more. I spoke to patient about the purpose of the study and the required assessments, should patient wish to participate. I reviewed with patient the inclusion criteria that I could not find in her medical record and patient confirms that she is able to hold her breath for 10 seconds, she is able to walk at least 2 blocks without chest pain, dyspnea, shortness of breath or feeling faint and is able to exercise on a treadmill or stationary cycle.  Patient also confirms that she is not claustrophobic and does not have any metal implants. Patient was informed that all baseline assessments need to be completed prior to the start of her chemotherapy, patient gave verbal understanding to this. After all of patient's and spouse's questions were answered, patient expressed interest in participating and appears to be eligible for study enrollment, pending a second research nurse review.  I informed patient that I will place a copy of the consent form in the mail, for her review and in the meantime will start scheduling a consent appointment as well as the baseline assessments. Patient and spouse both thanked for their time and interest in study and were encouraged to call with questions in the meantime. Contact information provided.  Consent form to be placed in mail today for patient.  Consent and baseline assessments to be scheduled. Patient states her schedule is open and she does not have any restrictions.  Maxwell Marion, RN,  BSN, [facility] Clinical Research 04/28/2019 11:38 AM

## 2019-04-30 ENCOUNTER — Other Ambulatory Visit (HOSPITAL_COMMUNITY): Payer: Self-pay | Admitting: Hematology and Oncology

## 2019-04-30 ENCOUNTER — Telehealth: Payer: Self-pay | Admitting: Medical Oncology

## 2019-04-30 DIAGNOSIS — Z006 Encounter for examination for normal comparison and control in clinical research program: Secondary | ICD-10-CM

## 2019-04-30 NOTE — Telephone Encounter (Signed)
UPBEAT: Referral Call to patient informing her that she is eligible for the UPBEAT study and to see if she was available to come in next week for review of consent, answer any questions and signing of the consent. Patient stated she was available and could come in Wednesday the 5th of August at 0930. I gave patient an overview of what the day would be like and inquired as to whether she would like to just come in for the consent signing and another day for the assessments. Patient was informed of what the baseline assessments we would need to collect, once all her questions are answered and she signs the study consent. Patient stated she wished to complete everything in one visit. I informed patient that I will schedule her on August 5th at 0930 for the consent process, 11:00 for the baseline cardiac MRI and the completion of the rest of the baseline assessments which include the physical function and neurocognitive assessment as well the study questionnaires and a medication review. Patient knows to come to cancer center and check in to registration and a research nurse will meet with her at that time. Patient gave verbal understanding and confirmation to the appointment. All of patient's questions answered to her satisfaction, patient was thanked for her interest in study and encouraged to call with questions in the meantime.  Maxwell Marion, RN, BSN, [facility] Clinical Research 04/30/2019 4:09 PM

## 2019-05-06 ENCOUNTER — Other Ambulatory Visit: Payer: Self-pay

## 2019-05-06 ENCOUNTER — Ambulatory Visit (HOSPITAL_COMMUNITY)
Admission: RE | Admit: 2019-05-06 | Discharge: 2019-05-06 | Disposition: A | Discharge status: Home / Self Care | Payer: 59 | Source: Ambulatory Visit | Attending: Hematology and Oncology | Admitting: Hematology and Oncology

## 2019-05-06 ENCOUNTER — Encounter: Payer: 59 | Admitting: *Deleted

## 2019-05-06 ENCOUNTER — Inpatient Hospital Stay: Payer: 59 | Attending: Hematology and Oncology | Admitting: *Deleted

## 2019-05-06 ENCOUNTER — Encounter: Payer: Self-pay | Admitting: *Deleted

## 2019-05-06 DIAGNOSIS — Z803 Family history of malignant neoplasm of breast: Secondary | ICD-10-CM | POA: Insufficient documentation

## 2019-05-06 DIAGNOSIS — Z170 Estrogen receptor positive status [ER+]: Secondary | ICD-10-CM | POA: Insufficient documentation

## 2019-05-06 DIAGNOSIS — Z006 Encounter for examination for normal comparison and control in clinical research program: Secondary | ICD-10-CM | POA: Insufficient documentation

## 2019-05-06 DIAGNOSIS — C50411 Malignant neoplasm of upper-outer quadrant of right female breast: Secondary | ICD-10-CM | POA: Insufficient documentation

## 2019-05-06 DIAGNOSIS — Z5112 Encounter for antineoplastic immunotherapy: Secondary | ICD-10-CM | POA: Insufficient documentation

## 2019-05-06 NOTE — Research (Signed)
05/06/2019 at 2:20pm - [place] consent visit note- The pt was into the cancer center this afternoon to review the UPBEAT consent form.  The pt said that she read the consent form thoroughly at home, and she wanted to participate in the study.  The research nurse went over each page of the consent form and hipaa form with the pt.  The pt had no questions or concerns about study participation.  The pt signed the consent form and hipaa form at 1:12pm.  The pt was given a copy of the consent form and hipaa form for her records.  The research nurse reviewed the Enrollment/Eligibility Checklist with the pt.  The pt denied any metal, clips, or electronic implanted devices.  The pt specifically denied any pulmonary hypertension, PE, or DVT in the past 6 months.  The pt also denied any history of cardiac problems.  The pt met all of the inclusion and exclusion criteria for the study, and she was deemed eligible for study entry.  The pt was successfully registered to the study, and she was assigned the PID number [id number].  The research nurse informed Vicente Males, Banker, to notify Adriana Simas about the pt's cardiac MRI as an expense paid by the study.  The pt is aware that her baseline research blood will be drawn on 05/15/19.  The pt is aware that she will receive her $25 gift card on 05/15/19 after all of her baseline assessments are completed. The pt was escorted to the MRI department for her baseline cardiac MRI.  After the pt's cardiac MRI, Farris Has, research assistant, met with the pt and administered the baseline neurocognitive booklet to the pt.  The pt completed the baseline self-administered questionnaires.  The research nurse reviewed the pt's responses on page 10 related to depression, and the pt does not meet criteria for further evaluation at this time.  Lastly, the pt met with Johney Maine, research nurse, and the pt completed the physical testing portion of the trial.  The pt was thanked for  her participation and support of this trial.  The pt's other baseline assessments will be performed at her next on-site visit on 05/15/19.   Brion Aliment RN, BSN, CCRP Clinical Research Nurse 05/06/2019 3:31 PM

## 2019-05-08 NOTE — Assessment & Plan Note (Signed)
04/02/2019:Screening mammogram detected a mass in the right axilla. [place] confirmed a 0.9cm right breast mass. Biopsy showed IDC, grade 2, HER-2+ (3+), ER+ 100%, PR+ 5%, Ki67 15%.  Treatment plan: 1.Breast conserving surgery with sentinel lymph node biopsy 2.Adjuvant chemotherapy with Taxol Herceptin weekly x12 followed by Herceptin maintenance for 1 year started 05/15/2019 3.Adjuvant radiation therapy 4.Followed by adjuvant antiestrogen therapy with anastrozole 1 mg daily x5 to 7 years ------------------------------------------------------------------------------------------------------------------------------------------- Current treatment: Cycle 1 Taxol Herceptin Echocardiogram 04/24/2019:: EF 60 to 65% Chemo consent obtained Chemo education completed Labs reviewed  Return to clinic in 1 week for cycle 2 chemo and for toxicity check

## 2019-05-11 ENCOUNTER — Other Ambulatory Visit: Payer: Self-pay

## 2019-05-11 ENCOUNTER — Encounter: Payer: Self-pay | Admitting: Physical Therapy

## 2019-05-11 ENCOUNTER — Ambulatory Visit: Payer: 59 | Attending: General Surgery | Admitting: Physical Therapy

## 2019-05-11 DIAGNOSIS — Z170 Estrogen receptor positive status [ER+]: Secondary | ICD-10-CM | POA: Diagnosis present

## 2019-05-11 DIAGNOSIS — R202 Paresthesia of skin: Secondary | ICD-10-CM | POA: Diagnosis present

## 2019-05-11 DIAGNOSIS — R293 Abnormal posture: Secondary | ICD-10-CM | POA: Diagnosis present

## 2019-05-11 DIAGNOSIS — Z483 Aftercare following surgery for neoplasm: Secondary | ICD-10-CM | POA: Diagnosis present

## 2019-05-11 DIAGNOSIS — C50211 Malignant neoplasm of upper-inner quadrant of right female breast: Secondary | ICD-10-CM | POA: Diagnosis not present

## 2019-05-11 NOTE — Therapy (Signed)
[place], [place], [address] Phone: [phone]   Fax:  [phone]  Physical Therapy Treatment  Patient Details  Name: Emily Ross MRN: [id number] Date of Birth: Mar 26, 1956 Referring Provider (PT): Dr. Rolm Bookbinder   Encounter Date: 05/11/2019  PT End of Session - 05/11/19 1346    Visit Number  2    Number of Visits  2    PT Start Time  1302    PT Stop Time  1341    PT Time Calculation (min)  39 min    Activity Tolerance  Patient tolerated treatment well    Behavior During Therapy  [facility] for tasks assessed/performed       Past Medical History:  Diagnosis Date  . Breast cancer ([place])   . Cancer [facility])    2009 left breast 2020 right breast  . Family history of breast cancer   . Family history of stomach cancer   . High cholesterol   . History of seasonal allergies   . Hypertension   . Pneumonia   . Wears glasses     Past Surgical History:  Procedure Laterality Date  . AXILLARY SENTINEL NODE BIOPSY Right 04/17/2019   Procedure: Right Axillary Sentinel Node Biopsy;  Surgeon: Rolm Bookbinder, MD;  Location: [place];  Service: General;  Laterality: Right;  . BREAST LUMPECTOMY    . BREAST LUMPECTOMY WITH RADIOACTIVE SEED AND SENTINEL LYMPH NODE BIOPSY Right 04/17/2019   Procedure: RIGHT BREAST LUMPECTOMY WITH RADIOACTIVE SEED;  Surgeon: Rolm Bookbinder, MD;  Location: [place];  Service: General;  Laterality: Right;  . COLONOSCOPY    . PORTACATH PLACEMENT Right 04/17/2019   Procedure: INSERTION PORT-A-CATH WITH ULTRASOUND;  Surgeon: Rolm Bookbinder, MD;  Location: [place];  Service: General;  Laterality: Right;  . TUBAL LIGATION    . WISDOM TOOTH EXTRACTION      There were no vitals filed for this visit.  Subjective Assessment - 05/11/19 1305    Subjective  Patient underwent a right lumpectomy and sentinel node biopsy on 04/17/2019 (0/5 nodes positive). She will need chemotherapy which  begins 05/15/2019 and then she will undergo radiation.    Pertinent History  Patient was diagnosed on 03/17/2019 with right grade II triple negative invasive ductal carcinoma breast cancer. It measures 9 mm and is located in the upper inner quadrant. The Ki67 is 15%. She has a history of a left lumpectomy and radiation in 2009. Patient underwent a right lumpectomy and sentinel node biopsy on 04/17/2019 (0/5 nodes positive).    Patient Stated Goals  See if my arm is ok and get rid of posterior arm sensitivity    Currently in Pain?  Yes    Pain Score  4     Pain Location  Arm    Pain Orientation  Right;Posterior;Upper    Pain Descriptors / Indicators  Tingling    Pain Type  Neuropathic pain    Pain Onset  1 to 4 weeks ago    Pain Frequency  Intermittent    Aggravating Factors   Unknown    Pain Relieving Factors  Heat/ice         [facility] PT Assessment - 05/11/19 0001      Assessment   Medical Diagnosis  s/p right lumpectomy with SLNB    Referring Provider (PT)  Dr. Rolm Bookbinder    Onset Date/Surgical Date  04/17/19    Hand Dominance  Right    Prior Therapy  Baselines  Precautions   Precautions  Other (comment)    Precaution Comments  Active cancer      Restrictions   Weight Bearing Restrictions  No      Balance Screen   Has the patient fallen in the past 6 months  No    Has the patient had a decrease in activity level because of a fear of falling?   No    Is the patient reluctant to leave their home because of a fear of falling?   No      Home Film/video editor residence    Living Arrangements  Spouse/significant other    Available Help at Discharge  Family      Prior Function   Level of Independence  Independent    Vocation  Unemployed    Leisure  She walks 30-60 min 5x/week      Cognition   Overall Cognitive Status  Within Functional Limits for tasks assessed      Observation/Other Assessments   Observations  Breast, axillary, and port  incisions all healing well with some steri-strips still present.      Posture/Postural Control   Posture/Postural Control  Postural limitations    Postural Limitations  Rounded Shoulders;Forward head      ROM / Strength   AROM / PROM / Strength  AROM      AROM   AROM Assessment Site  Shoulder    Right/Left Shoulder  Right    Right Shoulder Extension  50 Degrees    Right Shoulder Flexion  159 Degrees    Right Shoulder ABduction  175 Degrees    Right Shoulder Internal Rotation  79 Degrees    Right Shoulder External Rotation  87 Degrees        LYMPHEDEMA/ONCOLOGY QUESTIONNAIRE - 05/11/19 1314      Type   Cancer Type  Right breast cancer      Surgeries   Lumpectomy Date  04/17/19    Sentinel Lymph Node Biopsy Date  04/17/19    Number Lymph Nodes Removed  5      Treatment   Active Chemotherapy Treatment  No    Past Chemotherapy Treatment  No    Active Radiation Treatment  No    Past Radiation Treatment  No    Current Hormone Treatment  No    Past Hormone Therapy  No      What other symptoms do you have   Are you Having Heaviness or Tightness  No    Are you having Pain  Yes    Are you having pitting edema  No    Is it Hard or Difficult finding clothes that fit  No    Do you have infections  No    Is there Decreased scar mobility  Yes   Steristrips present on breast and at port site   Stemmer Sign  No      Lymphedema Assessments   Lymphedema Assessments  Upper extremities      Right Upper Extremity Lymphedema   10 cm Proximal to Olecranon Process  26.8 cm    Olecranon Process  23.5 cm    10 cm Proximal to Ulnar Styloid Process  21.1 cm    Just Proximal to Ulnar Styloid Process  15.2 cm    Across Hand at [employer]  18.1 cm    At [place] of 2nd Digit  5.8 cm      Left Upper Extremity Lymphedema  10 cm Proximal to Olecranon Process  27.9 cm    Olecranon Process  23.7 cm    10 cm Proximal to Ulnar Styloid Process  19.7 cm    Just Proximal to Ulnar Styloid  Process  15.3 cm    Across Hand at [employer]  17.5 cm    At [place] of 2nd Digit  5.5 cm        Quick Dash - 05/11/19 0001    Open a tight or new jar  Mild difficulty    Do heavy household chores (wash walls, wash floors)  No difficulty    Carry a shopping bag or briefcase  No difficulty    Wash your back  No difficulty    Use a knife to cut food  No difficulty    Recreational activities in which you take some force or impact through your arm, shoulder, or hand (golf, hammering, tennis)  No difficulty    During the past week, to what extent has your arm, shoulder or hand problem interfered with your normal social activities with family, friends, neighbors, or groups?  Not at all    During the past week, to what extent has your arm, shoulder or hand problem limited your work or other regular daily activities  Not at all    Arm, shoulder, or hand pain.  Moderate    Tingling (pins and needles) in your arm, shoulder, or hand  Moderate    Difficulty Sleeping  Mild difficulty    DASH Score  13.64 %                     PT Education - 05/11/19 1345    Education Details  Lymphedema risk reduction (got her signed up for ABC class); reviewed HEP; educated pt on desensitization tasks for right posterior upper arm    Person(s) Educated  Patient    Methods  Explanation;Demonstration;Handout    Comprehension  Returned demonstration;Verbalized understanding          PT Long Term Goals - 05/11/19 1349      PT LONG TERM GOAL #1   Title  Patient will demonstrate she has regained shoulder ROM and function post operatively compared ot baselines.    Time  8    Period  Weeks    Status  Achieved            Plan - 05/11/19 1346    Clinical Impression Statement  Patient is doing very well s/p right lumpectomy and sentinel nde biopsy. Her ROM and arm function is all back to baseline. Incisions appear to be healing well and there is no evidence of edema. She has some sensation  changes on her right posterior upper arm which are typical following a SLNB. Otherwise, she has no symptoms. She will benefit from attending the After Breast Cancer class but has no other needs at this time.    Rehab Potential  Excellent    PT Treatment/Interventions  ADLs/Self Care Home Management;Therapeutic exercise;Patient/family education    PT Next Visit Plan  D/C - goals met    PT Home Exercise Plan  Post op shoulder ROM HEP    Consulted and Agree with Plan of Care  Patient       Patient will benefit from skilled therapeutic intervention in order to improve the following deficits and impairments:  Pain, Impaired UE functional use, Decreased knowledge of precautions, Decreased range of motion, Impaired sensation  Visit Diagnosis: 1. Malignant neoplasm  of upper-inner quadrant of right breast in female, estrogen receptor positive ([place])   2. Abnormal posture   3. Aftercare following surgery for neoplasm   4. Paresthesia of skin        Problem List Patient Active Problem List   Diagnosis Date Noted  . Genetic testing 04/14/2019  . Carcinoma of upper-inner quadrant of right breast in female, estrogen receptor positive ([place]) 04/08/2019  . Family history of breast cancer   . Family history of stomach cancer   . Malignant neoplasm of upper-outer quadrant of right breast in female, estrogen receptor positive ([place]) 04/02/2019  . Essential hypertension 10/28/2018  . Hypercalcemia 10/28/2018  . Elevated LDL cholesterol level 10/28/2018  . Prediabetes 04/09/2018   PHYSICAL THERAPY DISCHARGE SUMMARY  Visits from Start of Care: 2  Current functional level related to goals / functional outcomes: Goals met; see above objective findings   Remaining deficits: None except some upper arm paresthesia   Education / Equipment: Lymphedema risk reduction and HEP Plan: Patient agrees to discharge.  Patient goals were met. Patient is being discharged due to meeting the stated rehab goals.   ?????        Annia Friendly, [place] 05/11/19 1:51 PM   [place], [place], [address] Phone: [phone]   Fax:  [phone]  Name: Emily Ross MRN: [id number] Date of Birth: 10/01/1956

## 2019-05-14 NOTE — Progress Notes (Signed)
Patient Care Team: Minette Brine, [place] as PCP - General (General Practice) Mauro Kaufmann, RN as Oncology Nurse Navigator Rockwell Germany, RN as Oncology Nurse Navigator Nicholas Lose, MD as Consulting Physician (Hematology and Oncology) Rolm Bookbinder, MD as Consulting Physician (General Surgery) Eppie Gibson, MD as Attending Physician (Radiation Oncology)  DIAGNOSIS:    ICD-10-CM   1. Malignant neoplasm of upper-outer quadrant of right breast in female, estrogen receptor positive ([place])  C50.411    Z17.0     SUMMARY OF ONCOLOGIC HISTORY: Oncology History  Malignant neoplasm of upper-outer quadrant of right breast in female, estrogen receptor positive ([place])  04/02/2019 Initial Diagnosis   Screening mammogram detected a mass in the right axilla. [place] confirmed a 0.9cm right breast mass. Biopsy showed IDC, grade 2, HER-2+ (3+), ER+ 100%, PR+ 5%, Ki67 15%.    04/08/2019 Cancer Staging   Staging form: Breast, AJCC 8th Edition - Clinical stage from 04/08/2019: Stage IA (cT1b, cN0, cM0, G2, ER+, PR+, HER2+) - Signed by Nicholas Lose, MD on 04/08/2019   04/14/2019 Genetic Testing   Patient had genetic testing done for her personal and family history of breast cancer.  Negative genetic testing on the [facility] Breast Cancer STAT panel. The STAT Breast cancer panel offered by [facility] includes sequencing and rearrangement analysis for the following 9 genes:  ATM, BRCA1, BRCA2, CDH1, CHEK2, PALB2, PTEN, STK11 and TP53.  The report date is 04/14/2019.   04/17/2019 Surgery   Right lumpectomy [name]): IDC, 2.0cm, grade 2, clear margins, 5 axillary lymph nodes negative for carcinoma   Carcinoma of upper-inner quadrant of right breast in female, estrogen receptor positive ([place])  04/08/2019 Cancer Staging   Staging form: Breast, AJCC 8th Edition - Clinical stage from 04/08/2019: Stage IA (cT1b, cN0, cM0, G2, ER+, PR+, HER2+) - Signed by Eppie Gibson, MD on 04/08/2019   05/15/2019 -  Chemotherapy   The  patient had PACLitaxel (TAXOL) 144 mg in sodium chloride 0.9 % 250 mL chemo infusion (</= 76m/m2), 80 mg/m2 = 144 mg, Intravenous,  Once, 0 of 3 cycles trastuzumab-anns (KANJINTI) 294 mg in sodium chloride 0.9 % 250 mL chemo infusion, 4 mg/kg = 294 mg (100 % of original dose 4 mg/kg), Intravenous,  Once, 0 of 16 cycles Dose modification: 4 mg/kg (original dose 4 mg/kg, Cycle 1, Reason: Other (see comments), Comment: load), 6 mg/kg (original dose 2 mg/kg, Cycle 3, Reason: Other (see comments), Comment: q3wk dosing), 2 mg/kg (original dose 2 mg/kg, Cycle 3, Reason: Other (see comments))  for chemotherapy treatment.      CHIEF COMPLIANT: Cycle 1 Taxol Herceptin  INTERVAL HISTORY: Emily DAKEis a 63y.o. with above-mentioned history of right breast cancer who underwent a lumpectomy. She is a participant in the Upbeat clinical trial. She presents to the clinic today to begin adjuvant chemotherapy with Taxol and Herceptin.   REVIEW OF SYSTEMS:   Constitutional: Denies fevers, chills or abnormal weight loss Eyes: Denies blurriness of vision Ears, nose, mouth, throat, and face: Denies mucositis or sore throat Respiratory: Denies cough, dyspnea or wheezes Cardiovascular: Denies palpitation, chest discomfort Gastrointestinal: Denies nausea, heartburn or change in bowel habits Skin: Denies abnormal skin rashes Lymphatics: Denies new lymphadenopathy or easy bruising Neurological: Denies numbness, tingling or new weaknesses Behavioral/Psych: Mood is stable, no new changes  Extremities: No lower extremity edema Breast: denies any pain or lumps or nodules in either breasts All other systems were reviewed with the patient and are negative.  I have reviewed the  past medical history, past surgical history, social history and family history with the patient and they are unchanged from previous note.  ALLERGIES:  has No Known Allergies.  MEDICATIONS:  Current Outpatient Medications  Medication Sig  Dispense Refill  . acetaminophen (TYLENOL) 500 MG tablet Take 500 mg by mouth every 6 (six) hours as needed for moderate pain or headache.    . Ascorbic Acid (VITAMIN C PO) Take 1 tablet by mouth daily.    Marland Kitchen b complex vitamins tablet Take 1 tablet by mouth daily.    . Carboxymethylcellul-Glycerin (LUBRICATING EYE DROPS OP) Place 1 drop into both eyes daily as needed (dry eyes).    . Cholecalciferol (DIALYVITE VITAMIN D 5000) 125 MCG (5000 UT) capsule Take 5,000 Units by mouth daily.    . Coenzyme Q10 (CO Q-10 PO) Take 1 capsule by mouth daily.    . diphenhydrAMINE (BENADRYL) 25 MG tablet Take 25 mg by mouth daily as needed for allergies.    . hydrochlorothiazide (HYDRODIURIL) 25 MG tablet Take 1 tablet (25 mg total) by mouth daily. 90 tablet 1  . ibuprofen (ADVIL) 600 MG tablet Take 1 tablet (600 mg total) by mouth every 6 (six) hours as needed. 30 tablet 2  . lidocaine-prilocaine (EMLA) cream Apply to affected area once 30 g 3  . LORazepam (ATIVAN) 0.5 MG tablet Take 1 tablet (0.5 mg total) by mouth at bedtime as needed for sleep. 30 tablet 0  . MAGNESIUM GLUCONATE PO Take 2 capsules by mouth daily.    . metoprolol tartrate (LOPRESSOR) 50 MG tablet Take 1 tablet (50 mg total) by mouth 2 (two) times daily. 180 tablet 1  . ondansetron (ZOFRAN) 8 MG tablet Take 1 tablet (8 mg total) by mouth 2 (two) times daily as needed (Nausea or vomiting). 30 tablet 1  . prochlorperazine (COMPAZINE) 10 MG tablet Take 1 tablet (10 mg total) by mouth every 6 (six) hours as needed (Nausea or vomiting). 30 tablet 1  . Red Yeast Rice Extract (RED YEAST RICE PO) Take 2 capsules by mouth daily.    . simvastatin (ZOCOR) 10 MG tablet Take 1 tablet (10 mg total) by mouth every evening. 90 tablet 1   No current facility-administered medications for this visit.     PHYSICAL EXAMINATION: ECOG PERFORMANCE STATUS: 1 - Symptomatic but completely ambulatory  Vitals:   05/15/19 0849 05/15/19 0855  BP: 138/83 138/78   Pulse: 69   Resp: 18   Temp: 98 F (36.7 C)   SpO2: 100%    Filed Weights   05/15/19 0849  Weight: 163 lb 4.8 oz (74.1 kg)    GENERAL: alert, no distress and comfortable SKIN: skin color, texture, turgor are normal, no rashes or significant lesions EYES: normal, Conjunctiva are pink and non-injected, sclera clear OROPHARYNX: no exudate, no erythema and lips, buccal mucosa, and tongue normal  NECK: supple, thyroid normal size, non-tender, without nodularity LYMPH: no palpable lymphadenopathy in the cervical, axillary or inguinal LUNGS: clear to auscultation and percussion with normal breathing effort HEART: regular rate & rhythm and no murmurs and no lower extremity edema ABDOMEN: abdomen soft, non-tender and normal bowel sounds MUSCULOSKELETAL: no cyanosis of digits and no clubbing  NEURO: alert & oriented x 3 with fluent speech, no focal motor/sensory deficits EXTREMITIES: No lower extremity edema  LABORATORY DATA:  I have reviewed the data as listed CMP Latest Ref Rng & Units 04/08/2019 10/28/2018 01/03/2012  Glucose 70 - 99 mg/dL 96 91 88  BUN 8 -  23 mg/dL 9 13 11   Creatinine 0.44 - 1.00 mg/dL 0.92 0.83 0.70  Sodium 135 - 145 mmol/L 140 141 140  Potassium 3.5 - 5.1 mmol/L 4.1 4.3 4.2  Chloride 98 - 111 mmol/L 100 99 103  CO2 22 - 32 mmol/L 31 27 31   Calcium 8.9 - 10.3 mg/dL 10.1 10.3 10.2  Total Protein 6.5 - 8.1 g/dL 7.9 7.1 -  Total Bilirubin 0.3 - 1.2 mg/dL 0.5 0.4 -  Alkaline Phos 38 - 126 U/L 67 66 -  AST 15 - 41 U/L 17 15 -  ALT 0 - 44 U/L 12 12 -    Lab Results  Component Value Date   WBC 3.3 (L) 05/15/2019   HGB 11.3 (L) 05/15/2019   HCT 34.6 (L) 05/15/2019   MCV 93.0 05/15/2019   PLT 260 05/15/2019   NEUTROABS 1.5 (L) 05/15/2019    ASSESSMENT & PLAN:  Malignant neoplasm of upper-outer quadrant of right breast in female, estrogen receptor positive ([place]) 04/02/2019:Screening mammogram detected a mass in the right axilla. [place] confirmed a 0.9cm right breast  mass. Biopsy showed IDC, grade 2, HER-2+ (3+), ER+ 100%, PR+ 5%, Ki67 15%.  Treatment plan: 1.Breast conserving surgery with sentinel lymph node biopsy 2.Adjuvant chemotherapy with Taxol Herceptin weekly x12 followed by Herceptin maintenance for 1 year started 05/15/2019 3.Adjuvant radiation therapy 4.Followed by adjuvant antiestrogen therapy with anastrozole 1 mg daily x5 to 7 years ------------------------------------------------------------------------------------------------------------------------------------------- Current treatment: Cycle 1 Taxol Herceptin Echocardiogram 04/24/2019:: EF 60 to 65% Chemo consent obtained Chemo education completed Labs reviewed  Return to clinic in 1 week for cycle 2 chemo and for toxicity check  No orders of the defined types were placed in this encounter.  The patient has a good understanding of the overall plan. she agrees with it. she will call with any problems that may develop before the next visit here.  Nicholas Lose, MD 05/15/2019  Julious Oka Dorshimer am acting as scribe for Dr. Nicholas Lose.  I have reviewed the above documentation for accuracy and completeness, and I agree with the above.

## 2019-05-15 ENCOUNTER — Inpatient Hospital Stay (HOSPITAL_BASED_OUTPATIENT_CLINIC_OR_DEPARTMENT_OTHER): Payer: 59 | Admitting: Hematology and Oncology

## 2019-05-15 ENCOUNTER — Encounter: Payer: Self-pay | Admitting: *Deleted

## 2019-05-15 ENCOUNTER — Inpatient Hospital Stay: Payer: 59

## 2019-05-15 ENCOUNTER — Telehealth: Payer: Self-pay

## 2019-05-15 ENCOUNTER — Other Ambulatory Visit: Payer: Self-pay

## 2019-05-15 VITALS — BP 150/81 | HR 74 | Temp 98.4°F | Resp 16

## 2019-05-15 DIAGNOSIS — Z5112 Encounter for antineoplastic immunotherapy: Secondary | ICD-10-CM | POA: Diagnosis present

## 2019-05-15 DIAGNOSIS — C50211 Malignant neoplasm of upper-inner quadrant of right female breast: Secondary | ICD-10-CM

## 2019-05-15 DIAGNOSIS — Z170 Estrogen receptor positive status [ER+]: Secondary | ICD-10-CM

## 2019-05-15 DIAGNOSIS — Z803 Family history of malignant neoplasm of breast: Secondary | ICD-10-CM | POA: Diagnosis not present

## 2019-05-15 DIAGNOSIS — Z95828 Presence of other vascular implants and grafts: Secondary | ICD-10-CM | POA: Insufficient documentation

## 2019-05-15 DIAGNOSIS — C50411 Malignant neoplasm of upper-outer quadrant of right female breast: Secondary | ICD-10-CM

## 2019-05-15 DIAGNOSIS — Z006 Encounter for examination for normal comparison and control in clinical research program: Secondary | ICD-10-CM | POA: Diagnosis not present

## 2019-05-15 LAB — CBC WITH DIFFERENTIAL (CANCER CENTER ONLY)
Abs Immature Granulocytes: 0 10*3/uL (ref 0.00–0.07)
Basophils Absolute: 0 10*3/uL (ref 0.0–0.1)
Basophils Relative: 1 %
Eosinophils Absolute: 0.1 10*3/uL (ref 0.0–0.5)
Eosinophils Relative: 4 %
HCT: 34.6 % — ABNORMAL LOW (ref 36.0–46.0)
Hemoglobin: 11.3 g/dL — ABNORMAL LOW (ref 12.0–15.0)
Immature Granulocytes: 0 %
Lymphocytes Relative: 39 %
Lymphs Abs: 1.3 10*3/uL (ref 0.7–4.0)
MCH: 30.4 pg (ref 26.0–34.0)
MCHC: 32.7 g/dL (ref 30.0–36.0)
MCV: 93 fL (ref 80.0–100.0)
Monocytes Absolute: 0.3 10*3/uL (ref 0.1–1.0)
Monocytes Relative: 10 %
Neutro Abs: 1.5 10*3/uL — ABNORMAL LOW (ref 1.7–7.7)
Neutrophils Relative %: 46 %
Platelet Count: 260 10*3/uL (ref 150–400)
RBC: 3.72 MIL/uL — ABNORMAL LOW (ref 3.87–5.11)
RDW: 13.1 % (ref 11.5–15.5)
WBC Count: 3.3 10*3/uL — ABNORMAL LOW (ref 4.0–10.5)
nRBC: 0 % (ref 0.0–0.2)

## 2019-05-15 LAB — CMP (CANCER CENTER ONLY)
ALT: 13 U/L (ref 0–44)
AST: 16 U/L (ref 15–41)
Albumin: 3.9 g/dL (ref 3.5–5.0)
Alkaline Phosphatase: 63 U/L (ref 38–126)
Anion gap: 9 (ref 5–15)
BUN: 13 mg/dL (ref 8–23)
CO2: 27 mmol/L (ref 22–32)
Calcium: 9.7 mg/dL (ref 8.9–10.3)
Chloride: 104 mmol/L (ref 98–111)
Creatinine: 0.83 mg/dL (ref 0.44–1.00)
GFR, Est AFR Am: >60 mL/min (ref >60)
GFR, Estimated: >60 mL/min (ref >60)
Glucose, Bld: 109 mg/dL — ABNORMAL HIGH (ref 70–99)
Potassium: 3.6 mmol/L (ref 3.5–5.1)
Sodium: 140 mmol/L (ref 135–145)
Total Bilirubin: 0.4 mg/dL (ref 0.3–1.2)
Total Protein: 7.2 g/dL (ref 6.5–8.1)

## 2019-05-15 LAB — RESEARCH LABS

## 2019-05-15 MED ORDER — HEPARIN SOD (PORK) LOCK FLUSH 100 UNIT/ML IV SOLN
500.0000 [IU] | Freq: Once | INTRAVENOUS | Status: AC | PRN
  Administered 2019-05-15: 500 [IU]
  Filled 2019-05-15: qty 5

## 2019-05-15 MED ORDER — FAMOTIDINE IN NACL 20-0.9 MG/50ML-% IV SOLN
20.0000 mg | Freq: Once | INTRAVENOUS | Status: AC
  Administered 2019-05-15: 20 mg via INTRAVENOUS

## 2019-05-15 MED ORDER — SODIUM CHLORIDE 0.9% FLUSH
10.0000 mL | INTRAVENOUS | Status: DC | PRN
  Administered 2019-05-15: 10 mL
  Filled 2019-05-15: qty 10

## 2019-05-15 MED ORDER — DEXAMETHASONE SODIUM PHOSPHATE 10 MG/ML IJ SOLN
10.0000 mg | Freq: Once | INTRAMUSCULAR | Status: AC
  Administered 2019-05-15: 10 mg via INTRAVENOUS

## 2019-05-15 MED ORDER — DIPHENHYDRAMINE HCL 50 MG/ML IJ SOLN
INTRAMUSCULAR | Status: AC
  Filled 2019-05-15: qty 1

## 2019-05-15 MED ORDER — SODIUM CHLORIDE 0.9 % IV SOLN
Freq: Once | INTRAVENOUS | Status: AC
  Administered 2019-05-15: 10:00:00 via INTRAVENOUS
  Filled 2019-05-15: qty 250

## 2019-05-15 MED ORDER — TRASTUZUMAB-ANNS CHEMO 150 MG IV SOLR
300.0000 mg | Freq: Once | INTRAVENOUS | Status: AC
  Administered 2019-05-15: 300 mg via INTRAVENOUS
  Filled 2019-05-15: qty 14.29

## 2019-05-15 MED ORDER — SODIUM CHLORIDE 0.9 % IV SOLN
80.0000 mg/m2 | Freq: Once | INTRAVENOUS | Status: AC
  Administered 2019-05-15: 144 mg via INTRAVENOUS
  Filled 2019-05-15: qty 24

## 2019-05-15 MED ORDER — FAMOTIDINE IN NACL 20-0.9 MG/50ML-% IV SOLN
INTRAVENOUS | Status: AC
  Filled 2019-05-15: qty 50

## 2019-05-15 MED ORDER — ACETAMINOPHEN 325 MG PO TABS
ORAL_TABLET | ORAL | Status: AC
  Filled 2019-05-15: qty 2

## 2019-05-15 MED ORDER — DEXAMETHASONE SODIUM PHOSPHATE 10 MG/ML IJ SOLN
INTRAMUSCULAR | Status: AC
  Filled 2019-05-15: qty 1

## 2019-05-15 MED ORDER — ACETAMINOPHEN 325 MG PO TABS
650.0000 mg | ORAL_TABLET | Freq: Once | ORAL | Status: AC
  Administered 2019-05-15: 650 mg via ORAL

## 2019-05-15 MED ORDER — DIPHENHYDRAMINE HCL 50 MG/ML IJ SOLN
25.0000 mg | Freq: Once | INTRAMUSCULAR | Status: AC
  Administered 2019-05-15: 25 mg via INTRAVENOUS

## 2019-05-15 NOTE — Progress Notes (Signed)
Discussed infusion time of Kajinti with RN. Kajinti to run over 90 minutes since this is patient's first infusion.  Larene Beach, PharmD

## 2019-05-15 NOTE — Research (Signed)
DCP-001 Use of a Clinical Trial Screening Tool to Address Cancer Health Disparities in the [place] Program: Met with patient in infusion room to review the DCP-001 study and offer her the opportunity to participate. Informed patient that participation is voluntary and involves a one time consent and collection of demographic variables, with the majority of data collected from their medical record. Noted that no patient identifiers are being reported to the study. Patient stated she was interested in participating. The consent and authorization forms were reviewed with the patient in their entirety. Explained the potential benefits and risks of participation in this study. All of her questions were answered and she agreed to participate. The consent form for [place] 11/21/17, [place] Active 03/13/18 and authorization form dated 11/22/14 were signed and dated by the patient. Copies of the documents were given to the patient for her records.  Patient was then interviewed by this research nurse and answered the study questions without difficulty.  Thanked patient for her time and participation on this study.  Patient meets eligibility and will be enrolled in the DCP-001 study.  Worksheet and original consent/hippa forms given to research assistant to enroll patient on study. Foye Spurling, BSN, RN Clinical Research Nurse 05/15/2019 1:19 PM

## 2019-05-15 NOTE — Telephone Encounter (Signed)
Called back Emily Ross as he was concerned about his wife getting Kanjinti instead of Herceptin. He was not aware of the change and was concerned that the medicine she was getting would not be as effective as the Herceptin.  I assured him that it would be and I printed a biosimilar packet for [name].  I took this to his wife so he could review it at home.  Kathlee Nations, RN came to originally with his concerns as he called her first.  Routing this message to Dr. Lindi Adie as well in case there are any more questions about biosimilars.  Gardiner Rhyme

## 2019-05-15 NOTE — Research (Signed)
UPBEAT [id number] BASELINE ASSESSMENTS: Patient in clinic by herself this morning for first chemotherapy along with Lab and MD visit. She will also complete baseline study activities for Upbeat.  Lab; Research labs collected per protocol prior to starting 1st chemotherapy today.  Patient confirmed she has fasted at least 3 hours prior to this appointment. VS; Height and weight obtained without shoes and in lightweight clothing.  BP and HR obtained after 5 minutes resting and then again one minute later per protocol.  Waist Measurement; 36 inches, obtained per study instructions on CRF11.  Concomitant Medications; Reviewed current medication list with patient and updated in EMR. Patient has not started the Lorazepam, Ondansetron or Prochlorperazine yet as they are PRN to treat chemotherapy side effects.  Gift Card; [phone] [employer] gift card given to patient for completing the baseline activities for this study.  Thanked patient for her time today and participation in this study.  Informed patient next study activity is research blood in one month and will be scheduled with her regular lab appointment. This is not a fasting lab. The next research appointment for 3 month visit will be scheduled closer to that time point and research staff will contact her to schedule at a time that is convenient for patient.  Encouraged patient to contact research if any questions or concerns prior to next contact. She verbalized understanding.  Foye Spurling, BSN, RN Clinical Research Nurse 05/15/2019 10:08 AM

## 2019-05-15 NOTE — Patient Instructions (Signed)
[facility] Discharge Instructions for Patients Receiving Chemotherapy  Today you received the following chemotherapy agents: Herceptin, Taxol.  To help prevent nausea and vomiting after your treatment, we encourage you to take your nausea medication as prescribed.   If you develop nausea and vomiting that is not controlled by your nausea medication, call the clinic.   BELOW ARE SYMPTOMS THAT SHOULD BE REPORTED IMMEDIATELY:  *FEVER GREATER THAN 100.5 F  *CHILLS WITH OR WITHOUT FEVER  NAUSEA AND VOMITING THAT IS NOT CONTROLLED WITH YOUR NAUSEA MEDICATION  *UNUSUAL SHORTNESS OF BREATH  *UNUSUAL BRUISING OR BLEEDING  TENDERNESS IN MOUTH AND THROAT WITH OR WITHOUT PRESENCE OF ULCERS  *URINARY PROBLEMS  *BOWEL PROBLEMS  UNUSUAL RASH Items with * indicate a potential emergency and should be followed up as soon as possible.  Feel free to call the clinic should you have any questions or concerns. The clinic phone number is [phone].  Please show the [place] at check-in to the Emergency Department and triage nurse.  Trastuzumab injection for infusion (Herceptin/Kanjinti) What is this medicine? TRASTUZUMAB (tras TOO zoo mab) is a monoclonal antibody. It is used to treat breast cancer and stomach cancer. This medicine may be used for other purposes; ask your health care provider or pharmacist if you have questions. COMMON BRAND NAME(S): Herceptin, Galvin Proffer, Trazimera What should I tell my health care provider before I take this medicine? They need to know if you have any of these conditions:  heart disease  heart failure  lung or breathing disease, like asthma  an unusual or allergic reaction to trastuzumab, benzyl alcohol, or other medications, foods, dyes, or preservatives  pregnant or trying to get pregnant  breast-feeding How should I use this medicine? This drug is given as an infusion into a vein. It is  administered in a hospital or clinic by a specially trained health care professional. Talk to your pediatrician regarding the use of this medicine in children. This medicine is not approved for use in children. Overdosage: If you think you have taken too much of this medicine contact a poison control center or emergency room at once. NOTE: This medicine is only for you. Do not share this medicine with others. What if I miss a dose? It is important not to miss a dose. Call your doctor or health care professional if you are unable to keep an appointment. What may interact with this medicine? This medicine may interact with the following medications:  certain types of chemotherapy, such as daunorubicin, doxorubicin, epirubicin, and idarubicin This list may not describe all possible interactions. Give your health care provider a list of all the medicines, herbs, non-prescription drugs, or dietary supplements you use. Also tell them if you smoke, drink alcohol, or use illegal drugs. Some items may interact with your medicine. What should I watch for while using this medicine? Visit your doctor for checks on your progress. Report any side effects. Continue your course of treatment even though you feel ill unless your doctor tells you to stop. Call your doctor or health care professional for advice if you get a fever, chills or sore throat, or other symptoms of a cold or flu. Do not treat yourself. Try to avoid being around people who are sick. You may experience fever, chills and shaking during your first infusion. These effects are usually mild and can be treated with other medicines. Report any side effects during the infusion to your health care professional. Fever  and chills usually do not happen with later infusions. Do not become pregnant while taking this medicine or for 7 months after stopping it. Women should inform their doctor if they wish to become pregnant or think they might be pregnant. Women  of child-bearing potential will need to have a negative pregnancy test before starting this medicine. There is a potential for serious side effects to an unborn child. Talk to your health care professional or pharmacist for more information. Do not breast-feed an infant while taking this medicine or for 7 months after stopping it. Women must use effective birth control with this medicine. What side effects may I notice from receiving this medicine? Side effects that you should report to your doctor or health care professional as soon as possible:  allergic reactions like skin rash, itching or hives, swelling of the face, lips, or tongue  chest pain or palpitations  cough  dizziness  feeling faint or lightheaded, falls  fever  general ill feeling or flu-like symptoms  signs of worsening heart failure like breathing problems; swelling in your legs and feet  unusually weak or tired Side effects that usually do not require medical attention (report to your doctor or health care professional if they continue or are bothersome):  bone pain  changes in taste  diarrhea  joint pain  nausea/vomiting  weight loss This list may not describe all possible side effects. Call your doctor for medical advice about side effects. You may report side effects to FDA at 1-800-FDA-1088. Where should I keep my medicine? This drug is given in a hospital or clinic and will not be stored at home. NOTE: This sheet is a summary. It may not cover all possible information. If you have questions about this medicine, talk to your doctor, pharmacist, or health care provider.  2020 Elsevier/Gold Standard (2016-09-11 14:37:52)   Paclitaxel injection (Taxol) What is this medicine? PACLITAXEL (PAK li TAX el) is a chemotherapy drug. It targets fast dividing cells, like cancer cells, and causes these cells to die. This medicine is used to treat ovarian cancer, breast cancer, lung cancer, Kaposi's sarcoma, and  other cancers. This medicine may be used for other purposes; ask your health care provider or pharmacist if you have questions. COMMON BRAND NAME(S): Onxol, Taxol What should I tell my health care provider before I take this medicine? They need to know if you have any of these conditions:  history of irregular heartbeat  liver disease  low blood counts, like low white cell, platelet, or red cell counts  lung or breathing disease, like asthma  tingling of the fingers or toes, or other nerve disorder  an unusual or allergic reaction to paclitaxel, alcohol, polyoxyethylated castor oil, other chemotherapy, other medicines, foods, dyes, or preservatives  pregnant or trying to get pregnant  breast-feeding How should I use this medicine? This drug is given as an infusion into a vein. It is administered in a hospital or clinic by a specially trained health care professional. Talk to your pediatrician regarding the use of this medicine in children. Special care may be needed. Overdosage: If you think you have taken too much of this medicine contact a poison control center or emergency room at once. NOTE: This medicine is only for you. Do not share this medicine with others. What if I miss a dose? It is important not to miss your dose. Call your doctor or health care professional if you are unable to keep an appointment. What may interact with  this medicine? Do not take this medicine with any of the following medications:  disulfiram  metronidazole This medicine may also interact with the following medications:  antiviral medicines for hepatitis, HIV or AIDS  certain antibiotics like erythromycin and clarithromycin  certain medicines for fungal infections like ketoconazole and itraconazole  certain medicines for seizures like carbamazepine, phenobarbital, phenytoin  gemfibrozil  nefazodone  rifampin  St. John's wort This list may not describe all possible interactions. Give  your health care provider a list of all the medicines, herbs, non-prescription drugs, or dietary supplements you use. Also tell them if you smoke, drink alcohol, or use illegal drugs. Some items may interact with your medicine. What should I watch for while using this medicine? Your condition will be monitored carefully while you are receiving this medicine. You will need important blood work done while you are taking this medicine. This medicine can cause serious allergic reactions. To reduce your risk you will need to take other medicine(s) before treatment with this medicine. If you experience allergic reactions like skin rash, itching or hives, swelling of the face, lips, or tongue, tell your doctor or health care professional right away. In some cases, you may be given additional medicines to help with side effects. Follow all directions for their use. This drug may make you feel generally unwell. This is not uncommon, as chemotherapy can affect healthy cells as well as cancer cells. Report any side effects. Continue your course of treatment even though you feel ill unless your doctor tells you to stop. Call your doctor or health care professional for advice if you get a fever, chills or sore throat, or other symptoms of a cold or flu. Do not treat yourself. This drug decreases your body's ability to fight infections. Try to avoid being around people who are sick. This medicine may increase your risk to bruise or bleed. Call your doctor or health care professional if you notice any unusual bleeding. Be careful brushing and flossing your teeth or using a toothpick because you may get an infection or bleed more easily. If you have any dental work done, tell your dentist you are receiving this medicine. Avoid taking products that contain aspirin, acetaminophen, ibuprofen, naproxen, or ketoprofen unless instructed by your doctor. These medicines may hide a fever. Do not become pregnant while taking this  medicine. Women should inform their doctor if they wish to become pregnant or think they might be pregnant. There is a potential for serious side effects to an unborn child. Talk to your health care professional or pharmacist for more information. Do not breast-feed an infant while taking this medicine. Men are advised not to father a child while receiving this medicine. This product may contain alcohol. Ask your pharmacist or healthcare provider if this medicine contains alcohol. Be sure to tell all healthcare providers you are taking this medicine. Certain medicines, like metronidazole and disulfiram, can cause an unpleasant reaction when taken with alcohol. The reaction includes flushing, headache, nausea, vomiting, sweating, and increased thirst. The reaction can last from 30 minutes to several hours. What side effects may I notice from receiving this medicine? Side effects that you should report to your doctor or health care professional as soon as possible:  allergic reactions like skin rash, itching or hives, swelling of the face, lips, or tongue  breathing problems  changes in vision  fast, irregular heartbeat  high or low blood pressure  mouth sores  pain, tingling, numbness in the hands or feet  signs of decreased platelets or bleeding - bruising, pinpoint red spots on the skin, black, tarry stools, blood in the urine  signs of decreased red blood cells - unusually weak or tired, feeling faint or lightheaded, falls  signs of infection - fever or chills, cough, sore throat, pain or difficulty passing urine  signs and symptoms of liver injury like dark yellow or brown urine; general ill feeling or flu-like symptoms; light-colored stools; loss of appetite; nausea; right upper belly pain; unusually weak or tired; yellowing of the eyes or skin  swelling of the ankles, feet, hands  unusually slow heartbeat Side effects that usually do not require medical attention (report to your  doctor or health care professional if they continue or are bothersome):  diarrhea  hair loss  loss of appetite  muscle or joint pain  nausea, vomiting  pain, redness, or irritation at site where injected  tiredness This list may not describe all possible side effects. Call your doctor for medical advice about side effects. You may report side effects to FDA at 1-800-FDA-1088. Where should I keep my medicine? This drug is given in a hospital or clinic and will not be stored at home. NOTE: This sheet is a summary. It may not cover all possible information. If you have questions about this medicine, talk to your doctor, pharmacist, or health care provider.  2020 Elsevier/Gold Standard (2017-05-21 13:14:55)

## 2019-05-18 ENCOUNTER — Telehealth: Payer: Self-pay | Admitting: Hematology and Oncology

## 2019-05-18 ENCOUNTER — Telehealth: Payer: Self-pay | Admitting: *Deleted

## 2019-05-18 NOTE — Telephone Encounter (Signed)
Spoke with patient to follow up after her 1st chemo. Patient states she is doing well with no complaints.  She states she has been walking almost everyday and eating and drinking well. Encouraged her to call with any needs or concerns.

## 2019-05-18 NOTE — Telephone Encounter (Signed)
I talk with patient regarding schedule change for 8/21

## 2019-05-19 ENCOUNTER — Telehealth: Payer: Self-pay | Admitting: Hematology and Oncology

## 2019-05-19 NOTE — Telephone Encounter (Signed)
Faxed most recent office visit to [facility] @ [phone] with [employer].

## 2019-05-21 NOTE — Progress Notes (Signed)
 Patient Care Team: Moore, Janece, FNP as PCP - General (General Practice) Stuart, Dawn C, RN as Oncology Nurse Navigator Martini, Keisha N, RN as Oncology Nurse Navigator Gudena, Vinay, MD as Consulting Physician (Hematology and Oncology) Wakefield, Matthew, MD as Consulting Physician (General Surgery) Squire, Sarah, MD as Attending Physician (Radiation Oncology)  DIAGNOSIS:    ICD-10-CM   1. Malignant neoplasm of upper-outer quadrant of right breast in female, estrogen receptor positive (HCC)  C50.411    Z17.0     SUMMARY OF ONCOLOGIC HISTORY: Oncology History  Malignant neoplasm of upper-outer quadrant of right breast in female, estrogen receptor positive (HCC)  04/02/2019 Initial Diagnosis   Screening mammogram detected a mass in the right axilla. US confirmed a 0.9cm right breast mass. Biopsy showed IDC, grade 2, HER-2+ (3+), ER+ 100%, PR+ 5%, Ki67 15%.    04/08/2019 Cancer Staging   Staging form: Breast, AJCC 8th Edition - Clinical stage from 04/08/2019: Stage IA (cT1b, cN0, cM0, G2, ER+, PR+, HER2+) - Signed by Gudena, Vinay, MD on 04/08/2019   04/14/2019 Genetic Testing   Patient had genetic testing done for her personal and family history of breast cancer.  Negative genetic testing on the [facility] Breast Cancer STAT panel. The STAT Breast cancer panel offered by [facility] includes sequencing and rearrangement analysis for the following 9 genes:  ATM, BRCA1, BRCA2, CDH1, CHEK2, PALB2, PTEN, STK11 and TP53.  The report date is 04/14/2019.   04/17/2019 Surgery   Right lumpectomy ([place]): IDC, 2.0cm, grade 2, clear margins, 5 axillary lymph nodes negative for carcinoma   Carcinoma of upper-inner quadrant of right breast in female, estrogen receptor positive (HCC)  04/08/2019 Cancer Staging   Staging form: Breast, AJCC 8th Edition - Clinical stage from 04/08/2019: Stage IA (cT1b, cN0, cM0, G2, ER+, PR+, HER2+) - Signed by Squire, Sarah, MD on 04/08/2019   05/15/2019 -  Chemotherapy   The  patient had PACLitaxel (TAXOL) 144 mg in sodium chloride 0.9 % 250 mL chemo infusion (</= 80mg/m2), 80 mg/m2 = 144 mg, Intravenous,  Once, 1 of 3 cycles Administration: 144 mg (05/15/2019) trastuzumab-anns (KANJINTI) 300 mg in sodium chloride 0.9 % 250 mL chemo infusion, 294 mg (100 % of original dose 4 mg/kg), Intravenous,  Once, 1 of 16 cycles Dose modification: 4 mg/kg (original dose 4 mg/kg, Cycle 1, Reason: Other (see comments), Comment: load), 6 mg/kg (original dose 2 mg/kg, Cycle 3, Reason: Other (see comments), Comment: q3wk dosing), 2 mg/kg (original dose 2 mg/kg, Cycle 3, Reason: Other (see comments)) Administration: 300 mg (05/15/2019)  for chemotherapy treatment.      CHIEF COMPLIANT: Cycle 2 Taxol Kanjinti  INTERVAL HISTORY: Emily Ross is a 63 y.o. with above-mentioned history of right breast cancer who underwent a lumpectomy and is currently on adjuvant chemotherapy with Taxol and Kanjinti. She is a participant in the [employer] clinical trial. She presents to the clinic today for a toxicity check following cycle 1 and for cycle 2.   REVIEW OF SYSTEMS:   Constitutional: Denies fevers, chills or abnormal weight loss Eyes: Denies blurriness of vision Ears, nose, mouth, throat, and face: Denies mucositis or sore throat Respiratory: Denies cough, dyspnea or wheezes Cardiovascular: Denies palpitation, chest discomfort Gastrointestinal: Denies nausea, heartburn or change in bowel habits Skin: Denies abnormal skin rashes Lymphatics: Denies new lymphadenopathy or easy bruising Neurological: Denies numbness, tingling or new weaknesses Behavioral/Psych: Mood is stable, no new changes  Extremities: No lower extremity edema Breast: denies any pain or lumps or nodules in   either breasts All other systems were reviewed with the patient and are negative.  I have reviewed the past medical history, past surgical history, social history and family history with the patient and they are unchanged  from previous note.  ALLERGIES:  has No Known Allergies.  MEDICATIONS:  Current Outpatient Medications  Medication Sig Dispense Refill  . Ascorbic Acid (VITAMIN C PO) Take 1 tablet by mouth daily.    . b complex vitamins tablet Take 1 tablet by mouth daily.    . Carboxymethylcellul-Glycerin (LUBRICATING EYE DROPS OP) Place 1 drop into both eyes daily as needed (dry eyes).    . Cholecalciferol (DIALYVITE VITAMIN D 5000) 125 MCG (5000 UT) capsule Take 5,000 Units by mouth daily.    . Coenzyme Q10 (CO Q-10 PO) Take 1 capsule by mouth daily.    . diphenhydrAMINE (BENADRYL) 25 MG tablet Take 25 mg by mouth daily as needed for allergies.    . hydrochlorothiazide (HYDRODIURIL) 25 MG tablet Take 1 tablet (25 mg total) by mouth daily. 90 tablet 1  . ibuprofen (ADVIL) 600 MG tablet Take 1 tablet (600 mg total) by mouth every 6 (six) hours as needed. 30 tablet 2  . lidocaine-prilocaine (EMLA) cream Apply to affected area once 30 g 3  . LORazepam (ATIVAN) 0.5 MG tablet Take 1 tablet (0.5 mg total) by mouth at bedtime as needed for sleep. 30 tablet 0  . MAGNESIUM GLUCONATE PO Take 2 capsules by mouth daily.    . metoprolol tartrate (LOPRESSOR) 50 MG tablet Take 1 tablet (50 mg total) by mouth 2 (two) times daily. 180 tablet 1  . ondansetron (ZOFRAN) 8 MG tablet Take 1 tablet (8 mg total) by mouth 2 (two) times daily as needed (Nausea or vomiting). 30 tablet 1  . prochlorperazine (COMPAZINE) 10 MG tablet Take 1 tablet (10 mg total) by mouth every 6 (six) hours as needed (Nausea or vomiting). 30 tablet 1  . Red Yeast Rice Extract (RED YEAST RICE PO) Take 2 capsules by mouth daily.    . simvastatin (ZOCOR) 10 MG tablet Take 1 tablet (10 mg total) by mouth every evening. 90 tablet 1   No current facility-administered medications for this visit.     PHYSICAL EXAMINATION: ECOG PERFORMANCE STATUS: 1 - Symptomatic but completely ambulatory  Vitals:   05/22/19 1116  BP: (!) 143/84  Pulse: 76  Resp: 18   Temp: 98.3 F (36.8 C)  SpO2: 99%   Filed Weights   05/22/19 1116  Weight: 164 lb 11.2 oz (74.7 kg)    GENERAL: alert, no distress and comfortable SKIN: skin color, texture, turgor are normal, no rashes or significant lesions EYES: normal, Conjunctiva are pink and non-injected, sclera clear OROPHARYNX: no exudate, no erythema and lips, buccal mucosa, and tongue normal  NECK: supple, thyroid normal size, non-tender, without nodularity LYMPH: no palpable lymphadenopathy in the cervical, axillary or inguinal LUNGS: clear to auscultation and percussion with normal breathing effort HEART: regular rate & rhythm and no murmurs and no lower extremity edema ABDOMEN: abdomen soft, non-tender and normal bowel sounds MUSCULOSKELETAL: no cyanosis of digits and no clubbing  NEURO: alert & oriented x 3 with fluent speech, no focal motor/sensory deficits EXTREMITIES: No lower extremity edema  LABORATORY DATA:  I have reviewed the data as listed CMP Latest Ref Rng & Units 05/22/2019 05/15/2019 04/08/2019  Glucose 70 - 99 mg/dL 101(H) 109(H) 96  BUN 8 - 23 mg/dL 19 13 9  Creatinine 0.44 - 1.00 mg/dL 0.82 0.83   0.92  Sodium 135 - 145 mmol/L 139 140 140  Potassium 3.5 - 5.1 mmol/L 3.9 3.6 4.1  Chloride 98 - 111 mmol/L 104 104 100  CO2 22 - 32 mmol/L 27 27 31  Calcium 8.9 - 10.3 mg/dL 9.6 9.7 10.1  Total Protein 6.5 - 8.1 g/dL 7.2 7.2 7.9  Total Bilirubin 0.3 - 1.2 mg/dL 0.4 0.4 0.5  Alkaline Phos 38 - 126 U/L 63 63 67  AST 15 - 41 U/L 22 16 17  ALT 0 - 44 U/L 21 13 12    Lab Results  Component Value Date   WBC 2.9 (L) 05/22/2019   HGB 10.8 (L) 05/22/2019   HCT 32.8 (L) 05/22/2019   MCV 92.7 05/22/2019   PLT 248 05/22/2019   NEUTROABS 1.6 (L) 05/22/2019    ASSESSMENT & PLAN:  Malignant neoplasm of upper-outer quadrant of right breast in female, estrogen receptor positive (HCC) 04/02/2019:Screening mammogram detected a mass in the right axilla. US confirmed a 0.9cm right breast mass. Biopsy  showed IDC, grade 2, HER-2+ (3+), ER+ 100%, PR+ 5%, Ki67 15%.  Treatment plan: 1.Breast conserving surgery with sentinel lymph node biopsy 2.Adjuvant chemotherapy with Taxol Herceptin weekly x12 followed by Herceptin maintenance for 1 year started 05/15/2019 3.Adjuvant radiation therapy 4.Followed by adjuvant antiestrogen therapy with anastrozole 1 mg daily x5 to 7 years ------------------------------------------------------------------------------------------------------------------------------------------- Current treatment: Cycle 2 Taxol Herceptin Echocardiogram 04/24/2019:: EF 60 to 65%  Chemo toxicities: Denies any nausea or vomiting.  Mild fatigue.  She is able to continue her full activities including exercises.  Return to clinic weekly for chemo and I will see her every other week for follow-up with me.    No orders of the defined types were placed in this encounter.  The patient has a good understanding of the overall plan. she agrees with it. she will call with any problems that may develop before the next visit here.  Gudena, Vinay, MD 05/22/2019  I, Molly Dorshimer am acting as scribe for Dr. Vinay Gudena.  I have reviewed the above documentation for accuracy and completeness, and I agree with the above.       

## 2019-05-22 ENCOUNTER — Inpatient Hospital Stay: Payer: 59

## 2019-05-22 ENCOUNTER — Other Ambulatory Visit: Payer: 59

## 2019-05-22 ENCOUNTER — Inpatient Hospital Stay (HOSPITAL_BASED_OUTPATIENT_CLINIC_OR_DEPARTMENT_OTHER): Payer: 59 | Admitting: Hematology and Oncology

## 2019-05-22 ENCOUNTER — Other Ambulatory Visit: Payer: Self-pay

## 2019-05-22 ENCOUNTER — Ambulatory Visit: Payer: 59

## 2019-05-22 DIAGNOSIS — Z170 Estrogen receptor positive status [ER+]: Secondary | ICD-10-CM | POA: Diagnosis not present

## 2019-05-22 DIAGNOSIS — Z95828 Presence of other vascular implants and grafts: Secondary | ICD-10-CM

## 2019-05-22 DIAGNOSIS — Z5112 Encounter for antineoplastic immunotherapy: Secondary | ICD-10-CM | POA: Diagnosis not present

## 2019-05-22 DIAGNOSIS — C50411 Malignant neoplasm of upper-outer quadrant of right female breast: Secondary | ICD-10-CM | POA: Diagnosis not present

## 2019-05-22 DIAGNOSIS — C50211 Malignant neoplasm of upper-inner quadrant of right female breast: Secondary | ICD-10-CM

## 2019-05-22 LAB — CBC WITH DIFFERENTIAL (CANCER CENTER ONLY)
Abs Immature Granulocytes: 0.01 10*3/uL (ref 0.00–0.07)
Basophils Absolute: 0 10*3/uL (ref 0.0–0.1)
Basophils Relative: 1 %
Eosinophils Absolute: 0.1 10*3/uL (ref 0.0–0.5)
Eosinophils Relative: 5 %
HCT: 32.8 % — ABNORMAL LOW (ref 36.0–46.0)
Hemoglobin: 10.8 g/dL — ABNORMAL LOW (ref 12.0–15.0)
Immature Granulocytes: 0 %
Lymphocytes Relative: 35 %
Lymphs Abs: 1 10*3/uL (ref 0.7–4.0)
MCH: 30.5 pg (ref 26.0–34.0)
MCHC: 32.9 g/dL (ref 30.0–36.0)
MCV: 92.7 fL (ref 80.0–100.0)
Monocytes Absolute: 0.2 10*3/uL (ref 0.1–1.0)
Monocytes Relative: 6 %
Neutro Abs: 1.6 10*3/uL — ABNORMAL LOW (ref 1.7–7.7)
Neutrophils Relative %: 53 %
Platelet Count: 248 10*3/uL (ref 150–400)
RBC: 3.54 MIL/uL — ABNORMAL LOW (ref 3.87–5.11)
RDW: 12.8 % (ref 11.5–15.5)
WBC Count: 2.9 10*3/uL — ABNORMAL LOW (ref 4.0–10.5)
nRBC: 0 % (ref 0.0–0.2)

## 2019-05-22 LAB — CMP (CANCER CENTER ONLY)
ALT: 21 U/L (ref 0–44)
AST: 22 U/L (ref 15–41)
Albumin: 3.9 g/dL (ref 3.5–5.0)
Alkaline Phosphatase: 63 U/L (ref 38–126)
Anion gap: 8 (ref 5–15)
BUN: 19 mg/dL (ref 8–23)
CO2: 27 mmol/L (ref 22–32)
Calcium: 9.6 mg/dL (ref 8.9–10.3)
Chloride: 104 mmol/L (ref 98–111)
Creatinine: 0.82 mg/dL (ref 0.44–1.00)
GFR, Est AFR Am: >60 mL/min (ref >60)
GFR, Estimated: >60 mL/min (ref >60)
Glucose, Bld: 101 mg/dL — ABNORMAL HIGH (ref 70–99)
Potassium: 3.9 mmol/L (ref 3.5–5.1)
Sodium: 139 mmol/L (ref 135–145)
Total Bilirubin: 0.4 mg/dL (ref 0.3–1.2)
Total Protein: 7.2 g/dL (ref 6.5–8.1)

## 2019-05-22 MED ORDER — HEPARIN SOD (PORK) LOCK FLUSH 100 UNIT/ML IV SOLN
500.0000 [IU] | Freq: Once | INTRAVENOUS | Status: AC | PRN
  Administered 2019-05-22: 500 [IU]
  Filled 2019-05-22: qty 5

## 2019-05-22 MED ORDER — TRASTUZUMAB-ANNS CHEMO 150 MG IV SOLR
150.0000 mg | Freq: Once | INTRAVENOUS | Status: AC
  Administered 2019-05-22: 150 mg via INTRAVENOUS
  Filled 2019-05-22: qty 7.14

## 2019-05-22 MED ORDER — FAMOTIDINE IN NACL 20-0.9 MG/50ML-% IV SOLN
INTRAVENOUS | Status: AC
  Filled 2019-05-22: qty 50

## 2019-05-22 MED ORDER — SODIUM CHLORIDE 0.9 % IV SOLN
Freq: Once | INTRAVENOUS | Status: AC
  Administered 2019-05-22: 13:00:00 via INTRAVENOUS
  Filled 2019-05-22: qty 250

## 2019-05-22 MED ORDER — ACETAMINOPHEN 325 MG PO TABS
650.0000 mg | ORAL_TABLET | Freq: Once | ORAL | Status: AC
  Administered 2019-05-22: 650 mg via ORAL

## 2019-05-22 MED ORDER — DEXAMETHASONE SODIUM PHOSPHATE 10 MG/ML IJ SOLN
10.0000 mg | Freq: Once | INTRAMUSCULAR | Status: AC
  Administered 2019-05-22: 13:00:00 10 mg via INTRAVENOUS

## 2019-05-22 MED ORDER — DIPHENHYDRAMINE HCL 50 MG/ML IJ SOLN
INTRAMUSCULAR | Status: AC
  Filled 2019-05-22: qty 1

## 2019-05-22 MED ORDER — DEXAMETHASONE SODIUM PHOSPHATE 10 MG/ML IJ SOLN
INTRAMUSCULAR | Status: AC
  Filled 2019-05-22: qty 1

## 2019-05-22 MED ORDER — SODIUM CHLORIDE 0.9 % IV SOLN
80.0000 mg/m2 | Freq: Once | INTRAVENOUS | Status: AC
  Administered 2019-05-22: 144 mg via INTRAVENOUS
  Filled 2019-05-22: qty 24

## 2019-05-22 MED ORDER — ACETAMINOPHEN 325 MG PO TABS
ORAL_TABLET | ORAL | Status: AC
  Filled 2019-05-22: qty 2

## 2019-05-22 MED ORDER — SODIUM CHLORIDE 0.9% FLUSH
10.0000 mL | INTRAVENOUS | Status: DC | PRN
  Administered 2019-05-22: 10 mL
  Filled 2019-05-22: qty 10

## 2019-05-22 MED ORDER — DIPHENHYDRAMINE HCL 50 MG/ML IJ SOLN
25.0000 mg | Freq: Once | INTRAMUSCULAR | Status: AC
  Administered 2019-05-22: 25 mg via INTRAVENOUS

## 2019-05-22 MED ORDER — FAMOTIDINE IN NACL 20-0.9 MG/50ML-% IV SOLN
20.0000 mg | Freq: Once | INTRAVENOUS | Status: AC
  Administered 2019-05-22: 20 mg via INTRAVENOUS

## 2019-05-22 NOTE — Assessment & Plan Note (Signed)
04/02/2019:Screening mammogram detected a mass in the right axilla. [place] confirmed a 0.9cm right breast mass. Biopsy showed IDC, grade 2, HER-2+ (3+), ER+ 100%, PR+ 5%, Ki67 15%.  Treatment plan: 1.Breast conserving surgery with sentinel lymph node biopsy 2.Adjuvant chemotherapy with Taxol Herceptin weekly x12 followed by Herceptin maintenance for 1 year started 05/15/2019 3.Adjuvant radiation therapy 4.Followed by adjuvant antiestrogen therapy with anastrozole 1 mg daily x5 to 7 years ------------------------------------------------------------------------------------------------------------------------------------------- Current treatment: Cycle 2 Taxol Herceptin Echocardiogram 04/24/2019:: EF 60 to 65%  Chemo toxicities: Denies any nausea or vomiting.  Mild fatigue.  She is able to continue her full activities including exercises.  Return to clinic weekly for chemo and I will see her every other week for follow-up with me.

## 2019-05-22 NOTE — Patient Instructions (Signed)
[facility] Discharge Instructions for Patients Receiving Chemotherapy  Today you received the following chemotherapy agents: Trastuzumab, Taxol  To help prevent nausea and vomiting after your treatment, we encourage you to take your nausea medication as directed.   If you develop nausea and vomiting that is not controlled by your nausea medication, call the clinic.   BELOW ARE SYMPTOMS THAT SHOULD BE REPORTED IMMEDIATELY:  *FEVER GREATER THAN 100.5 F  *CHILLS WITH OR WITHOUT FEVER  NAUSEA AND VOMITING THAT IS NOT CONTROLLED WITH YOUR NAUSEA MEDICATION  *UNUSUAL SHORTNESS OF BREATH  *UNUSUAL BRUISING OR BLEEDING  TENDERNESS IN MOUTH AND THROAT WITH OR WITHOUT PRESENCE OF ULCERS  *URINARY PROBLEMS  *BOWEL PROBLEMS  UNUSUAL RASH Items with * indicate a potential emergency and should be followed up as soon as possible.  Feel free to call the clinic should you have any questions or concerns. The clinic phone number is [phone].  Please show the CHEMO ALERT CARD at check-in to the Emergency Department and triage nurse.   

## 2019-05-27 ENCOUNTER — Other Ambulatory Visit: Payer: Self-pay | Admitting: *Deleted

## 2019-05-27 DIAGNOSIS — C50211 Malignant neoplasm of upper-inner quadrant of right female breast: Secondary | ICD-10-CM

## 2019-05-27 DIAGNOSIS — Z170 Estrogen receptor positive status [ER+]: Secondary | ICD-10-CM

## 2019-05-29 ENCOUNTER — Inpatient Hospital Stay: Payer: 59

## 2019-05-29 ENCOUNTER — Other Ambulatory Visit: Payer: Self-pay

## 2019-05-29 VITALS — BP 145/76 | HR 72 | Temp 98.2°F | Resp 18 | Wt 167.5 lb

## 2019-05-29 DIAGNOSIS — Z5112 Encounter for antineoplastic immunotherapy: Secondary | ICD-10-CM | POA: Diagnosis not present

## 2019-05-29 DIAGNOSIS — Z170 Estrogen receptor positive status [ER+]: Secondary | ICD-10-CM

## 2019-05-29 DIAGNOSIS — Z95828 Presence of other vascular implants and grafts: Secondary | ICD-10-CM

## 2019-05-29 DIAGNOSIS — C50211 Malignant neoplasm of upper-inner quadrant of right female breast: Secondary | ICD-10-CM

## 2019-05-29 LAB — CBC WITH DIFFERENTIAL (CANCER CENTER ONLY)
Abs Immature Granulocytes: 0.01 10*3/uL (ref 0.00–0.07)
Basophils Absolute: 0 10*3/uL (ref 0.0–0.1)
Basophils Relative: 1 %
Eosinophils Absolute: 0.1 10*3/uL (ref 0.0–0.5)
Eosinophils Relative: 3 %
HCT: 31.9 % — ABNORMAL LOW (ref 36.0–46.0)
Hemoglobin: 10.5 g/dL — ABNORMAL LOW (ref 12.0–15.0)
Immature Granulocytes: 0 %
Lymphocytes Relative: 39 %
Lymphs Abs: 1.3 10*3/uL (ref 0.7–4.0)
MCH: 30.5 pg (ref 26.0–34.0)
MCHC: 32.9 g/dL (ref 30.0–36.0)
MCV: 92.7 fL (ref 80.0–100.0)
Monocytes Absolute: 0.2 10*3/uL (ref 0.1–1.0)
Monocytes Relative: 5 %
Neutro Abs: 1.8 10*3/uL (ref 1.7–7.7)
Neutrophils Relative %: 52 %
Platelet Count: 274 10*3/uL (ref 150–400)
RBC: 3.44 MIL/uL — ABNORMAL LOW (ref 3.87–5.11)
RDW: 13.3 % (ref 11.5–15.5)
WBC Count: 3.4 10*3/uL — ABNORMAL LOW (ref 4.0–10.5)
nRBC: 0 % (ref 0.0–0.2)

## 2019-05-29 LAB — CMP (CANCER CENTER ONLY)
ALT: 24 U/L (ref 0–44)
AST: 22 U/L (ref 15–41)
Albumin: 3.7 g/dL (ref 3.5–5.0)
Alkaline Phosphatase: 61 U/L (ref 38–126)
Anion gap: 8 (ref 5–15)
BUN: 15 mg/dL (ref 8–23)
CO2: 28 mmol/L (ref 22–32)
Calcium: 9.3 mg/dL (ref 8.9–10.3)
Chloride: 104 mmol/L (ref 98–111)
Creatinine: 0.81 mg/dL (ref 0.44–1.00)
GFR, Est AFR Am: >60 mL/min (ref >60)
GFR, Estimated: >60 mL/min (ref >60)
Glucose, Bld: 109 mg/dL — ABNORMAL HIGH (ref 70–99)
Potassium: 3.4 mmol/L — ABNORMAL LOW (ref 3.5–5.1)
Sodium: 140 mmol/L (ref 135–145)
Total Bilirubin: 0.3 mg/dL (ref 0.3–1.2)
Total Protein: 7.1 g/dL (ref 6.5–8.1)

## 2019-05-29 MED ORDER — HEPARIN SOD (PORK) LOCK FLUSH 100 UNIT/ML IV SOLN
500.0000 [IU] | Freq: Once | INTRAVENOUS | Status: AC | PRN
  Administered 2019-05-29: 500 [IU]
  Filled 2019-05-29: qty 5

## 2019-05-29 MED ORDER — FAMOTIDINE IN NACL 20-0.9 MG/50ML-% IV SOLN
INTRAVENOUS | Status: AC
  Filled 2019-05-29: qty 50

## 2019-05-29 MED ORDER — ACETAMINOPHEN 325 MG PO TABS
ORAL_TABLET | ORAL | Status: AC
  Filled 2019-05-29: qty 2

## 2019-05-29 MED ORDER — DIPHENHYDRAMINE HCL 50 MG/ML IJ SOLN
25.0000 mg | Freq: Once | INTRAMUSCULAR | Status: AC
  Administered 2019-05-29: 25 mg via INTRAVENOUS

## 2019-05-29 MED ORDER — SODIUM CHLORIDE 0.9% FLUSH
10.0000 mL | INTRAVENOUS | Status: DC | PRN
  Administered 2019-05-29: 10 mL
  Filled 2019-05-29: qty 10

## 2019-05-29 MED ORDER — DEXAMETHASONE SODIUM PHOSPHATE 10 MG/ML IJ SOLN
10.0000 mg | Freq: Once | INTRAMUSCULAR | Status: AC
  Administered 2019-05-29: 14:00:00 10 mg via INTRAVENOUS

## 2019-05-29 MED ORDER — FAMOTIDINE IN NACL 20-0.9 MG/50ML-% IV SOLN
20.0000 mg | Freq: Once | INTRAVENOUS | Status: AC
  Administered 2019-05-29: 20 mg via INTRAVENOUS

## 2019-05-29 MED ORDER — DIPHENHYDRAMINE HCL 50 MG/ML IJ SOLN
INTRAMUSCULAR | Status: AC
  Filled 2019-05-29: qty 1

## 2019-05-29 MED ORDER — SODIUM CHLORIDE 0.9 % IV SOLN
80.0000 mg/m2 | Freq: Once | INTRAVENOUS | Status: AC
  Administered 2019-05-29: 144 mg via INTRAVENOUS
  Filled 2019-05-29: qty 24

## 2019-05-29 MED ORDER — TRASTUZUMAB-ANNS CHEMO 150 MG IV SOLR
150.0000 mg | Freq: Once | INTRAVENOUS | Status: AC
  Administered 2019-05-29: 150 mg via INTRAVENOUS
  Filled 2019-05-29: qty 7.14

## 2019-05-29 MED ORDER — DEXAMETHASONE SODIUM PHOSPHATE 10 MG/ML IJ SOLN
INTRAMUSCULAR | Status: AC
  Filled 2019-05-29: qty 1

## 2019-05-29 MED ORDER — SODIUM CHLORIDE 0.9 % IV SOLN
Freq: Once | INTRAVENOUS | Status: AC
  Administered 2019-05-29: 14:00:00 via INTRAVENOUS
  Filled 2019-05-29: qty 250

## 2019-05-29 MED ORDER — ACETAMINOPHEN 325 MG PO TABS
650.0000 mg | ORAL_TABLET | Freq: Once | ORAL | Status: AC
  Administered 2019-05-29: 650 mg via ORAL

## 2019-05-29 NOTE — Patient Instructions (Signed)
[facility] Discharge Instructions for Patients Receiving Chemotherapy  Today you received the following chemotherapy agents: Trastuzumab, Taxol  To help prevent nausea and vomiting after your treatment, we encourage you to take your nausea medication as directed.   If you develop nausea and vomiting that is not controlled by your nausea medication, call the clinic.   BELOW ARE SYMPTOMS THAT SHOULD BE REPORTED IMMEDIATELY:  *FEVER GREATER THAN 100.5 F  *CHILLS WITH OR WITHOUT FEVER  NAUSEA AND VOMITING THAT IS NOT CONTROLLED WITH YOUR NAUSEA MEDICATION  *UNUSUAL SHORTNESS OF BREATH  *UNUSUAL BRUISING OR BLEEDING  TENDERNESS IN MOUTH AND THROAT WITH OR WITHOUT PRESENCE OF ULCERS  *URINARY PROBLEMS  *BOWEL PROBLEMS  UNUSUAL RASH Items with * indicate a potential emergency and should be followed up as soon as possible.  Feel free to call the clinic should you have any questions or concerns. The clinic phone number is [phone].  Please show the CHEMO ALERT CARD at check-in to the Emergency Department and triage nurse.   

## 2019-05-29 NOTE — Patient Instructions (Signed)

## 2019-06-05 ENCOUNTER — Inpatient Hospital Stay: Payer: 59

## 2019-06-05 ENCOUNTER — Other Ambulatory Visit: Payer: Self-pay

## 2019-06-05 ENCOUNTER — Telehealth: Payer: Self-pay | Admitting: *Deleted

## 2019-06-05 ENCOUNTER — Inpatient Hospital Stay: Payer: 59 | Attending: Hematology and Oncology

## 2019-06-05 VITALS — BP 129/76 | HR 72 | Temp 98.5°F | Resp 18

## 2019-06-05 DIAGNOSIS — Z170 Estrogen receptor positive status [ER+]: Secondary | ICD-10-CM

## 2019-06-05 DIAGNOSIS — C50411 Malignant neoplasm of upper-outer quadrant of right female breast: Secondary | ICD-10-CM | POA: Diagnosis present

## 2019-06-05 DIAGNOSIS — Z95828 Presence of other vascular implants and grafts: Secondary | ICD-10-CM

## 2019-06-05 DIAGNOSIS — Z79899 Other long term (current) drug therapy: Secondary | ICD-10-CM | POA: Diagnosis not present

## 2019-06-05 DIAGNOSIS — Z23 Encounter for immunization: Secondary | ICD-10-CM | POA: Diagnosis not present

## 2019-06-05 DIAGNOSIS — Z5111 Encounter for antineoplastic chemotherapy: Secondary | ICD-10-CM | POA: Diagnosis not present

## 2019-06-05 DIAGNOSIS — C50211 Malignant neoplasm of upper-inner quadrant of right female breast: Secondary | ICD-10-CM

## 2019-06-05 LAB — CMP (CANCER CENTER ONLY)
ALT: 24 U/L (ref 0–44)
AST: 21 U/L (ref 15–41)
Albumin: 3.9 g/dL (ref 3.5–5.0)
Alkaline Phosphatase: 63 U/L (ref 38–126)
Anion gap: 9 (ref 5–15)
BUN: 17 mg/dL (ref 8–23)
CO2: 27 mmol/L (ref 22–32)
Calcium: 9.5 mg/dL (ref 8.9–10.3)
Chloride: 103 mmol/L (ref 98–111)
Creatinine: 0.95 mg/dL (ref 0.44–1.00)
GFR, Est AFR Am: >60 mL/min (ref >60)
GFR, Estimated: >60 mL/min (ref >60)
Glucose, Bld: 103 mg/dL — ABNORMAL HIGH (ref 70–99)
Potassium: 3.5 mmol/L (ref 3.5–5.1)
Sodium: 139 mmol/L (ref 135–145)
Total Bilirubin: 0.3 mg/dL (ref 0.3–1.2)
Total Protein: 7 g/dL (ref 6.5–8.1)

## 2019-06-05 LAB — CBC WITH DIFFERENTIAL (CANCER CENTER ONLY)
Abs Immature Granulocytes: 0.01 10*3/uL (ref 0.00–0.07)
Basophils Absolute: 0 10*3/uL (ref 0.0–0.1)
Basophils Relative: 1 %
Eosinophils Absolute: 0.1 10*3/uL (ref 0.0–0.5)
Eosinophils Relative: 2 %
HCT: 30.3 % — ABNORMAL LOW (ref 36.0–46.0)
Hemoglobin: 10.2 g/dL — ABNORMAL LOW (ref 12.0–15.0)
Immature Granulocytes: 0 %
Lymphocytes Relative: 34 %
Lymphs Abs: 1.1 10*3/uL (ref 0.7–4.0)
MCH: 31.7 pg (ref 26.0–34.0)
MCHC: 33.7 g/dL (ref 30.0–36.0)
MCV: 94.1 fL (ref 80.0–100.0)
Monocytes Absolute: 0.2 10*3/uL (ref 0.1–1.0)
Monocytes Relative: 5 %
Neutro Abs: 1.9 10*3/uL (ref 1.7–7.7)
Neutrophils Relative %: 58 %
Platelet Count: 309 10*3/uL (ref 150–400)
RBC: 3.22 MIL/uL — ABNORMAL LOW (ref 3.87–5.11)
RDW: 13.5 % (ref 11.5–15.5)
WBC Count: 3.2 10*3/uL — ABNORMAL LOW (ref 4.0–10.5)
nRBC: 0 % (ref 0.0–0.2)

## 2019-06-05 MED ORDER — SODIUM CHLORIDE 0.9% FLUSH
10.0000 mL | INTRAVENOUS | Status: DC | PRN
  Administered 2019-06-05: 10 mL
  Filled 2019-06-05: qty 10

## 2019-06-05 MED ORDER — DEXAMETHASONE SODIUM PHOSPHATE 10 MG/ML IJ SOLN
10.0000 mg | Freq: Once | INTRAMUSCULAR | Status: AC
  Administered 2019-06-05: 10 mg via INTRAVENOUS

## 2019-06-05 MED ORDER — TRASTUZUMAB-ANNS CHEMO 150 MG IV SOLR
150.0000 mg | Freq: Once | INTRAVENOUS | Status: AC
  Administered 2019-06-05: 150 mg via INTRAVENOUS
  Filled 2019-06-05: qty 7.14

## 2019-06-05 MED ORDER — FAMOTIDINE IN NACL 20-0.9 MG/50ML-% IV SOLN
INTRAVENOUS | Status: AC
  Filled 2019-06-05: qty 50

## 2019-06-05 MED ORDER — SODIUM CHLORIDE 0.9 % IV SOLN
80.0000 mg/m2 | Freq: Once | INTRAVENOUS | Status: AC
  Administered 2019-06-05: 144 mg via INTRAVENOUS
  Filled 2019-06-05: qty 24

## 2019-06-05 MED ORDER — DEXAMETHASONE SODIUM PHOSPHATE 10 MG/ML IJ SOLN
INTRAMUSCULAR | Status: AC
  Filled 2019-06-05: qty 1

## 2019-06-05 MED ORDER — HEPARIN SOD (PORK) LOCK FLUSH 100 UNIT/ML IV SOLN
500.0000 [IU] | Freq: Once | INTRAVENOUS | Status: AC | PRN
  Administered 2019-06-05: 500 [IU]
  Filled 2019-06-05: qty 5

## 2019-06-05 MED ORDER — DIPHENHYDRAMINE HCL 50 MG/ML IJ SOLN
INTRAMUSCULAR | Status: AC
  Filled 2019-06-05: qty 1

## 2019-06-05 MED ORDER — ACETAMINOPHEN 325 MG PO TABS
ORAL_TABLET | ORAL | Status: AC
  Filled 2019-06-05: qty 2

## 2019-06-05 MED ORDER — FAMOTIDINE IN NACL 20-0.9 MG/50ML-% IV SOLN
20.0000 mg | Freq: Once | INTRAVENOUS | Status: AC
  Administered 2019-06-05: 20 mg via INTRAVENOUS

## 2019-06-05 MED ORDER — ACETAMINOPHEN 325 MG PO TABS
650.0000 mg | ORAL_TABLET | Freq: Once | ORAL | Status: AC
  Administered 2019-06-05: 650 mg via ORAL

## 2019-06-05 MED ORDER — DIPHENHYDRAMINE HCL 50 MG/ML IJ SOLN
25.0000 mg | Freq: Once | INTRAMUSCULAR | Status: AC
  Administered 2019-06-05: 25 mg via INTRAVENOUS

## 2019-06-05 MED ORDER — SODIUM CHLORIDE 0.9 % IV SOLN
Freq: Once | INTRAVENOUS | Status: AC
  Administered 2019-06-05: 14:00:00 via INTRAVENOUS
  Filled 2019-06-05: qty 250

## 2019-06-05 NOTE — Telephone Encounter (Signed)
Spoke with patient to follow up.  Today is cycle 4.  She states she is doing really well with no complaints.  The only thing she notices is that she gets a little more tired in the evening.  Encouraged her to call with any needs.

## 2019-06-05 NOTE — Patient Instructions (Signed)
[facility] Discharge Instructions for Patients Receiving Chemotherapy  Today you received the following chemotherapy agents: Trastuzumab, Taxol  To help prevent nausea and vomiting after your treatment, we encourage you to take your nausea medication as directed.   If you develop nausea and vomiting that is not controlled by your nausea medication, call the clinic.   BELOW ARE SYMPTOMS THAT SHOULD BE REPORTED IMMEDIATELY:  *FEVER GREATER THAN 100.5 F  *CHILLS WITH OR WITHOUT FEVER  NAUSEA AND VOMITING THAT IS NOT CONTROLLED WITH YOUR NAUSEA MEDICATION  *UNUSUAL SHORTNESS OF BREATH  *UNUSUAL BRUISING OR BLEEDING  TENDERNESS IN MOUTH AND THROAT WITH OR WITHOUT PRESENCE OF ULCERS  *URINARY PROBLEMS  *BOWEL PROBLEMS  UNUSUAL RASH Items with * indicate a potential emergency and should be followed up as soon as possible.  Feel free to call the clinic should you have any questions or concerns. The clinic phone number is [phone].  Please show the CHEMO ALERT CARD at check-in to the Emergency Department and triage nurse.   

## 2019-06-11 NOTE — Progress Notes (Signed)
Patient Care Team: Minette Brine, [place] as PCP - General (General Practice) Mauro Kaufmann, RN as Oncology Nurse Navigator Rockwell Germany, RN as Oncology Nurse Navigator Nicholas Lose, MD as Consulting Physician (Hematology and Oncology) Rolm Bookbinder, MD as Consulting Physician (General Surgery) Eppie Gibson, MD as Attending Physician (Radiation Oncology)  DIAGNOSIS:    ICD-10-CM   1. Carcinoma of upper-inner quadrant of right breast in female, estrogen receptor positive ([place])  C50.211    Z17.0     SUMMARY OF ONCOLOGIC HISTORY: Oncology History  Malignant neoplasm of upper-outer quadrant of right breast in female, estrogen receptor positive ([place])  04/02/2019 Initial Diagnosis   Screening mammogram detected a mass in the right axilla. [place] confirmed a 0.9cm right breast mass. Biopsy showed IDC, grade 2, HER-2+ (3+), ER+ 100%, PR+ 5%, Ki67 15%.    04/08/2019 Cancer Staging   Staging form: Breast, AJCC 8th Edition - Clinical stage from 04/08/2019: Stage IA (cT1b, cN0, cM0, G2, ER+, PR+, HER2+) - Signed by Nicholas Lose, MD on 04/08/2019   04/14/2019 Genetic Testing   Patient had genetic testing done for her personal and family history of breast cancer.  Negative genetic testing on the [facility] Breast Cancer STAT panel. The STAT Breast cancer panel offered by [facility] includes sequencing and rearrangement analysis for the following 9 genes:  ATM, BRCA1, BRCA2, CDH1, CHEK2, PALB2, PTEN, STK11 and TP53.  The report date is 04/14/2019.   04/17/2019 Surgery   Right lumpectomy [name]): IDC, 2.0cm, grade 2, clear margins, 5 axillary lymph nodes negative for carcinoma   Carcinoma of upper-inner quadrant of right breast in female, estrogen receptor positive ([place])  04/08/2019 Cancer Staging   Staging form: Breast, AJCC 8th Edition - Clinical stage from 04/08/2019: Stage IA (cT1b, cN0, cM0, G2, ER+, PR+, HER2+) - Signed by Eppie Gibson, MD on 04/08/2019   05/15/2019 -  Chemotherapy   The patient  had PACLitaxel (TAXOL) 144 mg in sodium chloride 0.9 % 250 mL chemo infusion (</= 68m/m2), 80 mg/m2 = 144 mg, Intravenous,  Once, 1 of 3 cycles Administration: 144 mg (05/15/2019), 144 mg (05/22/2019), 144 mg (05/29/2019), 144 mg (06/05/2019) trastuzumab-anns (KANJINTI) 300 mg in sodium chloride 0.9 % 250 mL chemo infusion, 294 mg (100 % of original dose 4 mg/kg), Intravenous,  Once, 1 of 16 cycles Dose modification: 4 mg/kg (original dose 4 mg/kg, Cycle 1, Reason: Other (see comments), Comment: load), 6 mg/kg (original dose 2 mg/kg, Cycle 3, Reason: Other (see comments), Comment: q3wk dosing), 2 mg/kg (original dose 2 mg/kg, Cycle 3, Reason: Other (see comments)), 450 mg (original dose 2 mg/kg, Cycle 3, Reason: Other (see comments), Comment: Maintenance dosing), 450 mg (original dose 6 mg/kg, Cycle 4, Reason: Other (see comments), Comment: rounded; vial size) Administration: 300 mg (05/15/2019), 150 mg (05/22/2019), 150 mg (05/29/2019), 150 mg (06/05/2019)  for chemotherapy treatment.      CHIEF COMPLIANT: Cycle 5 Taxol Kanjinti  INTERVAL HISTORY: Emily FOWERSis a 63y.o. with above-mentioned history of right breast cancerwho underwent a lumpectomy and is currently on adjuvant chemotherapy with Taxol and Kanjinti. She is a participant in the [employer] clinical trial.She presents to the clinic today for cycle 5.  She does not have any fevers or chills.  Denies any nausea or vomiting.  Denies any diarrhea.  She feels fatigued at the end of the day.  REVIEW OF SYSTEMS:   Constitutional: Denies fevers, chills or abnormal weight loss Eyes: Denies blurriness of vision Ears, nose, mouth, throat, and face:  Denies mucositis or sore throat Respiratory: Denies cough, dyspnea or wheezes Cardiovascular: Denies palpitation, chest discomfort Gastrointestinal: Denies nausea, heartburn or change in bowel habits Skin: Denies abnormal skin rashes Lymphatics: Denies new lymphadenopathy or easy bruising Neurological:  Denies numbness, tingling or new weaknesses Behavioral/Psych: Mood is stable, no new changes  Extremities: No lower extremity edema Breast: denies any pain or lumps or nodules in either breasts All other systems were reviewed with the patient and are negative.  I have reviewed the past medical history, past surgical history, social history and family history with the patient and they are unchanged from previous note.  ALLERGIES:  has No Known Allergies.  MEDICATIONS:  Current Outpatient Medications  Medication Sig Dispense Refill   Ascorbic Acid (VITAMIN C PO) Take 1 tablet by mouth daily.     b complex vitamins tablet Take 1 tablet by mouth daily.     Carboxymethylcellul-Glycerin (LUBRICATING EYE DROPS OP) Place 1 drop into both eyes daily as needed (dry eyes).     Cholecalciferol (DIALYVITE VITAMIN D 5000) 125 MCG (5000 UT) capsule Take 5,000 Units by mouth daily.     Coenzyme Q10 (CO Q-10 PO) Take 1 capsule by mouth daily.     diphenhydrAMINE (BENADRYL) 25 MG tablet Take 25 mg by mouth daily as needed for allergies.     hydrochlorothiazide (HYDRODIURIL) 25 MG tablet Take 1 tablet (25 mg total) by mouth daily. 90 tablet 1   ibuprofen (ADVIL) 600 MG tablet Take 1 tablet (600 mg total) by mouth every 6 (six) hours as needed. 30 tablet 2   lidocaine-prilocaine (EMLA) cream Apply to affected area once 30 g 3   LORazepam (ATIVAN) 0.5 MG tablet Take 1 tablet (0.5 mg total) by mouth at bedtime as needed for sleep. 30 tablet 0   MAGNESIUM GLUCONATE PO Take 2 capsules by mouth daily.     metoprolol tartrate (LOPRESSOR) 50 MG tablet Take 1 tablet (50 mg total) by mouth 2 (two) times daily. 180 tablet 1   ondansetron (ZOFRAN) 8 MG tablet Take 1 tablet (8 mg total) by mouth 2 (two) times daily as needed (Nausea or vomiting). 30 tablet 1   prochlorperazine (COMPAZINE) 10 MG tablet Take 1 tablet (10 mg total) by mouth every 6 (six) hours as needed (Nausea or vomiting). 30 tablet 1   Red  Yeast Rice Extract (RED YEAST RICE PO) Take 2 capsules by mouth daily.     simvastatin (ZOCOR) 10 MG tablet Take 1 tablet (10 mg total) by mouth every evening. 90 tablet 1   No current facility-administered medications for this visit.     PHYSICAL EXAMINATION: ECOG PERFORMANCE STATUS: 1 - Symptomatic but completely ambulatory  Vitals:   06/12/19 1038  BP: 137/82  Pulse: 69  Resp: 18  Temp: 98.5 F (36.9 C)  SpO2: 100%   Filed Weights   06/12/19 1038  Weight: 166 lb 11.2 oz (75.6 kg)    GENERAL: alert, no distress and comfortable SKIN: skin color, texture, turgor are normal, no rashes or significant lesions EYES: normal, Conjunctiva are pink and non-injected, sclera clear OROPHARYNX: no exudate, no erythema and lips, buccal mucosa, and tongue normal  NECK: supple, thyroid normal size, non-tender, without nodularity LYMPH: no palpable lymphadenopathy in the cervical, axillary or inguinal LUNGS: clear to auscultation and percussion with normal breathing effort HEART: regular rate & rhythm and no murmurs and no lower extremity edema ABDOMEN: abdomen soft, non-tender and normal bowel sounds MUSCULOSKELETAL: no cyanosis of digits and no clubbing  NEURO: alert & oriented x 3 with fluent speech, no focal motor/sensory deficits EXTREMITIES: No lower extremity edema  LABORATORY DATA:  I have reviewed the data as listed CMP Latest Ref Rng & Units 06/05/2019 05/29/2019 05/22/2019  Glucose 70 - 99 mg/dL 103(H) 109(H) 101(H)  BUN 8 - 23 mg/dL 17 15 19   Creatinine 0.44 - 1.00 mg/dL 0.95 0.81 0.82  Sodium 135 - 145 mmol/L 139 140 139  Potassium 3.5 - 5.1 mmol/L 3.5 3.4(L) 3.9  Chloride 98 - 111 mmol/L 103 104 104  CO2 22 - 32 mmol/L 27 28 27   Calcium 8.9 - 10.3 mg/dL 9.5 9.3 9.6  Total Protein 6.5 - 8.1 g/dL 7.0 7.1 7.2  Total Bilirubin 0.3 - 1.2 mg/dL 0.3 0.3 0.4  Alkaline Phos 38 - 126 U/L 63 61 63  AST 15 - 41 U/L 21 22 22   ALT 0 - 44 U/L 24 24 21     Lab Results  Component  Value Date   WBC 2.6 (L) 06/12/2019   HGB 10.2 (L) 06/12/2019   HCT 31.0 (L) 06/12/2019   MCV 94.2 06/12/2019   PLT 314 06/12/2019   NEUTROABS 1.4 (L) 06/12/2019    ASSESSMENT & PLAN:  Carcinoma of upper-inner quadrant of right breast in female, estrogen receptor positive ([place]) 04/02/2019:Screening mammogram detected a mass in the right axilla. [place] confirmed a 0.9cm right breast mass. Biopsy showed IDC, grade 2, HER-2+ (3+), ER+ 100%, PR+ 5%, Ki67 15%.  Treatment plan: 1.Breast conserving surgery with sentinel lymph node biopsy 2.Adjuvant chemotherapy with Taxol Herceptin weekly x12 followed by Herceptin maintenance for 1 year started8/14/2020 3.Adjuvant radiation therapy 4.Followed by adjuvant antiestrogen therapy with anastrozole 1 mg daily x5 to 7 years ------------------------------------------------------------------------------------------------------------------------------------------- Current treatment: Cycle 5 Taxol Herceptin Echocardiogram 04/24/2019:: EF 60 to 65%  Chemo toxicities: Denies any nausea or vomiting.  Mild fatigue.  She is able to continue her full activities including exercises. Leukopenia: Monitoring very closely.  Her ANC is 1.4 today.  Okay to treat from medical oncology standpoint.  I discussed with her that if it drops further then we will do reduce the dosage of chemo.  Return to clinic weekly for chemo and I will see her every other week for follow-up with me.  No orders of the defined types were placed in this encounter.  The patient has a good understanding of the overall plan. she agrees with it. she will call with any problems that may develop before the next visit here.  Nicholas Lose, MD 06/12/2019  Julious Oka Dorshimer am acting as scribe for Dr. Nicholas Lose.  I have reviewed the above documentation for accuracy and completeness, and I agree with the above.

## 2019-06-12 ENCOUNTER — Inpatient Hospital Stay: Payer: 59

## 2019-06-12 ENCOUNTER — Inpatient Hospital Stay (HOSPITAL_BASED_OUTPATIENT_CLINIC_OR_DEPARTMENT_OTHER): Payer: 59 | Admitting: Hematology and Oncology

## 2019-06-12 ENCOUNTER — Other Ambulatory Visit: Payer: Self-pay

## 2019-06-12 DIAGNOSIS — Z170 Estrogen receptor positive status [ER+]: Secondary | ICD-10-CM

## 2019-06-12 DIAGNOSIS — C50411 Malignant neoplasm of upper-outer quadrant of right female breast: Secondary | ICD-10-CM | POA: Diagnosis not present

## 2019-06-12 DIAGNOSIS — C50211 Malignant neoplasm of upper-inner quadrant of right female breast: Secondary | ICD-10-CM

## 2019-06-12 DIAGNOSIS — Z23 Encounter for immunization: Secondary | ICD-10-CM

## 2019-06-12 DIAGNOSIS — Z006 Encounter for examination for normal comparison and control in clinical research program: Secondary | ICD-10-CM

## 2019-06-12 DIAGNOSIS — Z95828 Presence of other vascular implants and grafts: Secondary | ICD-10-CM

## 2019-06-12 LAB — CMP (CANCER CENTER ONLY)
ALT: 18 U/L (ref 0–44)
AST: 19 U/L (ref 15–41)
Albumin: 4 g/dL (ref 3.5–5.0)
Alkaline Phosphatase: 63 U/L (ref 38–126)
Anion gap: 6 (ref 5–15)
BUN: 17 mg/dL (ref 8–23)
CO2: 29 mmol/L (ref 22–32)
Calcium: 9.7 mg/dL (ref 8.9–10.3)
Chloride: 104 mmol/L (ref 98–111)
Creatinine: 0.8 mg/dL (ref 0.44–1.00)
GFR, Est AFR Am: >60 mL/min (ref >60)
GFR, Estimated: >60 mL/min (ref >60)
Glucose, Bld: 115 mg/dL — ABNORMAL HIGH (ref 70–99)
Potassium: 3.5 mmol/L (ref 3.5–5.1)
Sodium: 139 mmol/L (ref 135–145)
Total Bilirubin: 0.3 mg/dL (ref 0.3–1.2)
Total Protein: 7.1 g/dL (ref 6.5–8.1)

## 2019-06-12 LAB — CBC WITH DIFFERENTIAL (CANCER CENTER ONLY)
Abs Immature Granulocytes: 0.02 K/uL (ref 0.00–0.07)
Basophils Absolute: 0 K/uL (ref 0.0–0.1)
Basophils Relative: 1 %
Eosinophils Absolute: 0.1 K/uL (ref 0.0–0.5)
Eosinophils Relative: 3 %
HCT: 31 % — ABNORMAL LOW (ref 36.0–46.0)
Hemoglobin: 10.2 g/dL — ABNORMAL LOW (ref 12.0–15.0)
Immature Granulocytes: 1 %
Lymphocytes Relative: 34 %
Lymphs Abs: 0.9 K/uL (ref 0.7–4.0)
MCH: 31 pg (ref 26.0–34.0)
MCHC: 32.9 g/dL (ref 30.0–36.0)
MCV: 94.2 fL (ref 80.0–100.0)
Monocytes Absolute: 0.2 K/uL (ref 0.1–1.0)
Monocytes Relative: 8 %
Neutro Abs: 1.4 K/uL — ABNORMAL LOW (ref 1.7–7.7)
Neutrophils Relative %: 53 %
Platelet Count: 314 K/uL (ref 150–400)
RBC: 3.29 MIL/uL — ABNORMAL LOW (ref 3.87–5.11)
RDW: 14 % (ref 11.5–15.5)
WBC Count: 2.6 K/uL — ABNORMAL LOW (ref 4.0–10.5)
nRBC: 0 % (ref 0.0–0.2)

## 2019-06-12 LAB — RESEARCH LABS

## 2019-06-12 MED ORDER — SODIUM CHLORIDE 0.9 % IV SOLN
Freq: Once | INTRAVENOUS | Status: AC
  Administered 2019-06-12: 12:00:00 via INTRAVENOUS
  Filled 2019-06-12: qty 250

## 2019-06-12 MED ORDER — DIPHENHYDRAMINE HCL 50 MG/ML IJ SOLN
INTRAMUSCULAR | Status: AC
  Filled 2019-06-12: qty 1

## 2019-06-12 MED ORDER — HEPARIN SOD (PORK) LOCK FLUSH 100 UNIT/ML IV SOLN
500.0000 [IU] | Freq: Once | INTRAVENOUS | Status: AC | PRN
  Administered 2019-06-12: 500 [IU]
  Filled 2019-06-12: qty 5

## 2019-06-12 MED ORDER — INFLUENZA VAC SPLIT QUAD 0.5 ML IM SUSY
0.5000 mL | PREFILLED_SYRINGE | Freq: Once | INTRAMUSCULAR | Status: AC
  Administered 2019-06-12: 0.5 mL via INTRAMUSCULAR
  Filled 2019-06-12: qty 0.5

## 2019-06-12 MED ORDER — TRASTUZUMAB-ANNS CHEMO 150 MG IV SOLR
150.0000 mg | Freq: Once | INTRAVENOUS | Status: AC
  Administered 2019-06-12: 150 mg via INTRAVENOUS
  Filled 2019-06-12: qty 7.14

## 2019-06-12 MED ORDER — DEXAMETHASONE SODIUM PHOSPHATE 10 MG/ML IJ SOLN
10.0000 mg | Freq: Once | INTRAMUSCULAR | Status: AC
  Administered 2019-06-12: 10 mg via INTRAVENOUS

## 2019-06-12 MED ORDER — FAMOTIDINE IN NACL 20-0.9 MG/50ML-% IV SOLN
20.0000 mg | Freq: Once | INTRAVENOUS | Status: AC
  Administered 2019-06-12: 20 mg via INTRAVENOUS

## 2019-06-12 MED ORDER — INFLUENZA VAC A&B SA ADJ QUAD 0.5 ML IM PRSY: 0.5000 mL | PREFILLED_SYRINGE | INTRAMUSCULAR | Status: DC

## 2019-06-12 MED ORDER — ACETAMINOPHEN 325 MG PO TABS
650.0000 mg | ORAL_TABLET | Freq: Once | ORAL | Status: AC
  Administered 2019-06-12: 650 mg via ORAL

## 2019-06-12 MED ORDER — FAMOTIDINE IN NACL 20-0.9 MG/50ML-% IV SOLN
INTRAVENOUS | Status: AC
  Filled 2019-06-12: qty 50

## 2019-06-12 MED ORDER — SODIUM CHLORIDE 0.9% FLUSH
10.0000 mL | INTRAVENOUS | Status: DC | PRN
  Administered 2019-06-12: 10 mL
  Filled 2019-06-12: qty 10

## 2019-06-12 MED ORDER — DIPHENHYDRAMINE HCL 50 MG/ML IJ SOLN
25.0000 mg | Freq: Once | INTRAMUSCULAR | Status: AC
  Administered 2019-06-12: 25 mg via INTRAVENOUS

## 2019-06-12 MED ORDER — SODIUM CHLORIDE 0.9 % IV SOLN
80.0000 mg/m2 | Freq: Once | INTRAVENOUS | Status: AC
  Administered 2019-06-12: 144 mg via INTRAVENOUS
  Filled 2019-06-12: qty 24

## 2019-06-12 MED ORDER — INFLUENZA VAC A&B SA ADJ QUAD 0.5 ML IM PRSY: 0.5000 mL | PREFILLED_SYRINGE | Freq: Once | INTRAMUSCULAR | Status: DC

## 2019-06-12 MED ORDER — DEXAMETHASONE SODIUM PHOSPHATE 10 MG/ML IJ SOLN
INTRAMUSCULAR | Status: AC
  Filled 2019-06-12: qty 1

## 2019-06-12 MED ORDER — ACETAMINOPHEN 325 MG PO TABS
ORAL_TABLET | ORAL | Status: AC
  Filled 2019-06-12: qty 2

## 2019-06-12 NOTE — Patient Instructions (Signed)

## 2019-06-12 NOTE — Patient Instructions (Signed)
[facility] Discharge Instructions for Patients Receiving Chemotherapy  Today you received the following chemotherapy agents: Trastuzumab-anns (Kanjinti) and Paclitaxel (Taxol)  To help prevent nausea and vomiting after your treatment, we encourage you to take your nausea medication as directed.   If you develop nausea and vomiting that is not controlled by your nausea medication, call the clinic.   BELOW ARE SYMPTOMS THAT SHOULD BE REPORTED IMMEDIATELY:  *FEVER GREATER THAN 100.5 F  *CHILLS WITH OR WITHOUT FEVER  NAUSEA AND VOMITING THAT IS NOT CONTROLLED WITH YOUR NAUSEA MEDICATION  *UNUSUAL SHORTNESS OF BREATH  *UNUSUAL BRUISING OR BLEEDING  TENDERNESS IN MOUTH AND THROAT WITH OR WITHOUT PRESENCE OF ULCERS  *URINARY PROBLEMS  *BOWEL PROBLEMS  UNUSUAL RASH Items with * indicate a potential emergency and should be followed up as soon as possible.  Feel free to call the clinic should you have any questions or concerns. The clinic phone number is [phone].  Please show the [place] at check-in to the Emergency Department and triage nurse.  Coronavirus (COVID-19) Are you at risk?  Are you at risk for the Coronavirus (COVID-19)?  To be considered HIGH RISK for Coronavirus (COVID-19), you have to meet the following criteria:  . Traveled to [place], [place], [place], [place] or [place]; or in the [place] to [place], [place], [place], or [place]; and have fever, cough, and shortness of breath within the last 2 weeks of travel OR . Been in close contact with a person diagnosed with COVID-19 within the last 2 weeks and have fever, cough, and shortness of breath . IF YOU DO NOT MEET THESE CRITERIA, YOU ARE CONSIDERED LOW RISK FOR COVID-19.  What to do if you are HIGH RISK for COVID-19?  Marland Kitchen If you are having a medical emergency, call 911. . Seek medical care right away. Before you go to a doctor's office, urgent care or emergency department,  call ahead and tell them about your recent travel, contact with someone diagnosed with COVID-19, and your symptoms. You should receive instructions from your physician's office regarding next steps of care.  . When you arrive at healthcare provider, tell the healthcare staff immediately you have returned from visiting [place], [place], [place], [place] or [place]; or traveled in the [place] to [place], [place], [place], or [place]; in the last two weeks or you have been in close contact with a person diagnosed with COVID-19 in the last 2 weeks.   . Tell the health care staff about your symptoms: fever, cough and shortness of breath. . After you have been seen by a medical provider, you will be either: o Tested for (COVID-19) and discharged home on quarantine except to seek medical care if symptoms worsen, and asked to  - Stay home and avoid contact with others until you get your results (4-5 days)  - Avoid travel on public transportation if possible (such as bus, train, or airplane) or o Sent to the Emergency Department by EMS for evaluation, COVID-19 testing, and possible admission depending on your condition and test results.  What to do if you are LOW RISK for COVID-19?  Reduce your risk of any infection by using the same precautions used for avoiding the common cold or flu:  Marland Kitchen Wash your hands often with soap and warm water for at least 20 seconds.  If soap and water are not readily available, use an alcohol-based hand sanitizer with at least 60% alcohol.  Marland Kitchen  If coughing or sneezing, cover your mouth and nose by coughing or sneezing into the elbow areas of your shirt or coat, into a tissue or into your sleeve (not your hands). . Avoid shaking hands with others and consider head nods or verbal greetings only. . Avoid touching your eyes, nose, or mouth with unwashed hands.  . Avoid close contact with people who are sick. . Avoid places or events with large numbers of people in one  location, like concerts or sporting events. . Carefully consider travel plans you have or are making. . If you are planning any travel outside or inside the [place], visit the CDC's Travelers' Health webpage for the latest health notices. . If you have some symptoms but not all symptoms, continue to monitor at home and seek medical attention if your symptoms worsen. . If you are having a medical emergency, call 911.   ADDITIONAL HEALTHCARE OPTIONS FOR PATIENTS  [facility] / e-Visit: [email or web address]         [facility]: [phone]  [facility]: [phone]                   [facility]: [phone]   

## 2019-06-12 NOTE — Assessment & Plan Note (Signed)
04/02/2019:Screening mammogram detected a mass in the right axilla. [place] confirmed a 0.9cm right breast mass. Biopsy showed IDC, grade 2, HER-2+ (3+), ER+ 100%, PR+ 5%, Ki67 15%.  Treatment plan: 1.Breast conserving surgery with sentinel lymph node biopsy 2.Adjuvant chemotherapy with Taxol Herceptin weekly x12 followed by Herceptin maintenance for 1 year started8/14/2020 3.Adjuvant radiation therapy 4.Followed by adjuvant antiestrogen therapy with anastrozole 1 mg daily x5 to 7 years ------------------------------------------------------------------------------------------------------------------------------------------- Current treatment: Cycle 4 Taxol Herceptin Echocardiogram 04/24/2019:: EF 60 to 65%  Chemo toxicities: Denies any nausea or vomiting.  Mild fatigue.  She is able to continue her full activities including exercises.  Return to clinic weekly for chemo and I will see her every other week for follow-up with me.

## 2019-06-19 ENCOUNTER — Inpatient Hospital Stay: Payer: 59

## 2019-06-19 ENCOUNTER — Other Ambulatory Visit: Payer: Self-pay

## 2019-06-19 VITALS — BP 142/81 | HR 73 | Temp 98.3°F | Resp 17 | Wt 168.4 lb

## 2019-06-19 DIAGNOSIS — C50411 Malignant neoplasm of upper-outer quadrant of right female breast: Secondary | ICD-10-CM | POA: Diagnosis not present

## 2019-06-19 DIAGNOSIS — C50211 Malignant neoplasm of upper-inner quadrant of right female breast: Secondary | ICD-10-CM

## 2019-06-19 DIAGNOSIS — Z95828 Presence of other vascular implants and grafts: Secondary | ICD-10-CM

## 2019-06-19 DIAGNOSIS — Z170 Estrogen receptor positive status [ER+]: Secondary | ICD-10-CM

## 2019-06-19 LAB — CMP (CANCER CENTER ONLY)
ALT: 16 U/L (ref 0–44)
AST: 18 U/L (ref 15–41)
Albumin: 4 g/dL (ref 3.5–5.0)
Alkaline Phosphatase: 61 U/L (ref 38–126)
Anion gap: 8 (ref 5–15)
BUN: 18 mg/dL (ref 8–23)
CO2: 28 mmol/L (ref 22–32)
Calcium: 9.6 mg/dL (ref 8.9–10.3)
Chloride: 102 mmol/L (ref 98–111)
Creatinine: 0.87 mg/dL (ref 0.44–1.00)
GFR, Est AFR Am: >60 mL/min (ref >60)
GFR, Estimated: >60 mL/min (ref >60)
Glucose, Bld: 99 mg/dL (ref 70–99)
Potassium: 3.7 mmol/L (ref 3.5–5.1)
Sodium: 138 mmol/L (ref 135–145)
Total Bilirubin: 0.3 mg/dL (ref 0.3–1.2)
Total Protein: 7.1 g/dL (ref 6.5–8.1)

## 2019-06-19 LAB — CBC WITH DIFFERENTIAL (CANCER CENTER ONLY)
Abs Immature Granulocytes: 0.03 10*3/uL (ref 0.00–0.07)
Basophils Absolute: 0 10*3/uL (ref 0.0–0.1)
Basophils Relative: 1 %
Eosinophils Absolute: 0.1 10*3/uL (ref 0.0–0.5)
Eosinophils Relative: 3 %
HCT: 31.5 % — ABNORMAL LOW (ref 36.0–46.0)
Hemoglobin: 10.4 g/dL — ABNORMAL LOW (ref 12.0–15.0)
Immature Granulocytes: 1 %
Lymphocytes Relative: 34 %
Lymphs Abs: 1.3 10*3/uL (ref 0.7–4.0)
MCH: 31.4 pg (ref 26.0–34.0)
MCHC: 33 g/dL (ref 30.0–36.0)
MCV: 95.2 fL (ref 80.0–100.0)
Monocytes Absolute: 0.2 10*3/uL (ref 0.1–1.0)
Monocytes Relative: 6 %
Neutro Abs: 2.2 10*3/uL (ref 1.7–7.7)
Neutrophils Relative %: 55 %
Platelet Count: 325 10*3/uL (ref 150–400)
RBC: 3.31 MIL/uL — ABNORMAL LOW (ref 3.87–5.11)
RDW: 14.3 % (ref 11.5–15.5)
WBC Count: 3.9 10*3/uL — ABNORMAL LOW (ref 4.0–10.5)
nRBC: 0 % (ref 0.0–0.2)

## 2019-06-19 MED ORDER — SODIUM CHLORIDE 0.9 % IV SOLN
80.0000 mg/m2 | Freq: Once | INTRAVENOUS | Status: AC
  Administered 2019-06-19: 144 mg via INTRAVENOUS
  Filled 2019-06-19: qty 24

## 2019-06-19 MED ORDER — TRASTUZUMAB-ANNS CHEMO 150 MG IV SOLR
150.0000 mg | Freq: Once | INTRAVENOUS | Status: AC
  Administered 2019-06-19: 150 mg via INTRAVENOUS
  Filled 2019-06-19: qty 7.14

## 2019-06-19 MED ORDER — SODIUM CHLORIDE 0.9 % IV SOLN
Freq: Once | INTRAVENOUS | Status: AC
  Administered 2019-06-19: 13:00:00 via INTRAVENOUS
  Filled 2019-06-19: qty 250

## 2019-06-19 MED ORDER — SODIUM CHLORIDE 0.9% FLUSH
10.0000 mL | INTRAVENOUS | Status: DC | PRN
  Administered 2019-06-19: 10 mL
  Filled 2019-06-19: qty 10

## 2019-06-19 MED ORDER — ACETAMINOPHEN 325 MG PO TABS
ORAL_TABLET | ORAL | Status: AC
  Filled 2019-06-19: qty 2

## 2019-06-19 MED ORDER — HEPARIN SOD (PORK) LOCK FLUSH 100 UNIT/ML IV SOLN
500.0000 [IU] | Freq: Once | INTRAVENOUS | Status: AC | PRN
  Administered 2019-06-19: 500 [IU]
  Filled 2019-06-19: qty 5

## 2019-06-19 MED ORDER — DEXAMETHASONE SODIUM PHOSPHATE 10 MG/ML IJ SOLN
INTRAMUSCULAR | Status: AC
  Filled 2019-06-19: qty 1

## 2019-06-19 MED ORDER — FAMOTIDINE IN NACL 20-0.9 MG/50ML-% IV SOLN
20.0000 mg | Freq: Once | INTRAVENOUS | Status: AC
  Administered 2019-06-19: 20 mg via INTRAVENOUS

## 2019-06-19 MED ORDER — DIPHENHYDRAMINE HCL 50 MG/ML IJ SOLN
25.0000 mg | Freq: Once | INTRAMUSCULAR | Status: AC
  Administered 2019-06-19: 25 mg via INTRAVENOUS

## 2019-06-19 MED ORDER — DIPHENHYDRAMINE HCL 50 MG/ML IJ SOLN
INTRAMUSCULAR | Status: AC
  Filled 2019-06-19: qty 1

## 2019-06-19 MED ORDER — ACETAMINOPHEN 325 MG PO TABS
650.0000 mg | ORAL_TABLET | Freq: Once | ORAL | Status: AC
  Administered 2019-06-19: 650 mg via ORAL

## 2019-06-19 MED ORDER — DEXAMETHASONE SODIUM PHOSPHATE 10 MG/ML IJ SOLN
10.0000 mg | Freq: Once | INTRAMUSCULAR | Status: AC
  Administered 2019-06-19: 10 mg via INTRAVENOUS

## 2019-06-19 MED ORDER — FAMOTIDINE IN NACL 20-0.9 MG/50ML-% IV SOLN
INTRAVENOUS | Status: AC
  Filled 2019-06-19: qty 50

## 2019-06-19 NOTE — Patient Instructions (Signed)
[facility] Discharge Instructions for Patients Receiving Chemotherapy  Today you received the following chemotherapy agents: Taxol/trastuzumab  To help prevent nausea and vomiting after your treatment, we encourage you to take your nausea medication as directed.   If you develop nausea and vomiting that is not controlled by your nausea medication, call the clinic.   BELOW ARE SYMPTOMS THAT SHOULD BE REPORTED IMMEDIATELY:  *FEVER GREATER THAN 100.5 F  *CHILLS WITH OR WITHOUT FEVER  NAUSEA AND VOMITING THAT IS NOT CONTROLLED WITH YOUR NAUSEA MEDICATION  *UNUSUAL SHORTNESS OF BREATH  *UNUSUAL BRUISING OR BLEEDING  TENDERNESS IN MOUTH AND THROAT WITH OR WITHOUT PRESENCE OF ULCERS  *URINARY PROBLEMS  *BOWEL PROBLEMS  UNUSUAL RASH Items with * indicate a potential emergency and should be followed up as soon as possible.  Feel free to call the clinic should you have any questions or concerns. The clinic phone number is [phone].  Please show the [place] at check-in to the Emergency Department and triage nurse.

## 2019-06-25 NOTE — Progress Notes (Signed)
Patient Care Team: Minette Brine, [place] as PCP - General (General Practice) Mauro Kaufmann, RN as Oncology Nurse Navigator Rockwell Germany, RN as Oncology Nurse Navigator Nicholas Lose, MD as Consulting Physician (Hematology and Oncology) Rolm Bookbinder, MD as Consulting Physician (General Surgery) Eppie Gibson, MD as Attending Physician (Radiation Oncology)  DIAGNOSIS:    ICD-10-CM   1. Carcinoma of upper-inner quadrant of right breast in female, estrogen receptor positive ([place])  C50.211    Z17.0     SUMMARY OF ONCOLOGIC HISTORY: Oncology History  Malignant neoplasm of upper-outer quadrant of right breast in female, estrogen receptor positive ([place])  04/02/2019 Initial Diagnosis   Screening mammogram detected a mass in the right axilla. [place] confirmed a 0.9cm right breast mass. Biopsy showed IDC, grade 2, HER-2+ (3+), ER+ 100%, PR+ 5%, Ki67 15%.    04/08/2019 Cancer Staging   Staging form: Breast, AJCC 8th Edition - Clinical stage from 04/08/2019: Stage IA (cT1b, cN0, cM0, G2, ER+, PR+, HER2+) - Signed by Nicholas Lose, MD on 04/08/2019   04/14/2019 Genetic Testing   Patient had genetic testing done for her personal and family history of breast cancer.  Negative genetic testing on the [facility] Breast Cancer STAT panel. The STAT Breast cancer panel offered by [facility] includes sequencing and rearrangement analysis for the following 9 genes:  ATM, BRCA1, BRCA2, CDH1, CHEK2, PALB2, PTEN, STK11 and TP53.  The report date is 04/14/2019.   04/17/2019 Surgery   Right lumpectomy [name]): IDC, 2.0cm, grade 2, clear margins, 5 axillary lymph nodes negative for carcinoma   Carcinoma of upper-inner quadrant of right breast in female, estrogen receptor positive ([place])  04/08/2019 Cancer Staging   Staging form: Breast, AJCC 8th Edition - Clinical stage from 04/08/2019: Stage IA (cT1b, cN0, cM0, G2, ER+, PR+, HER2+) - Signed by Eppie Gibson, MD on 04/08/2019   05/15/2019 -  Chemotherapy   The patient  had PACLitaxel (TAXOL) 144 mg in sodium chloride 0.9 % 250 mL chemo infusion (</= 5m/m2), 80 mg/m2 = 144 mg, Intravenous,  Once, 2 of 3 cycles Administration: 144 mg (05/15/2019), 144 mg (05/22/2019), 144 mg (06/12/2019), 144 mg (05/29/2019), 144 mg (06/05/2019), 144 mg (06/19/2019) trastuzumab-anns (KANJINTI) 300 mg in sodium chloride 0.9 % 250 mL chemo infusion, 294 mg (100 % of original dose 4 mg/kg), Intravenous,  Once, 2 of 16 cycles Dose modification: 4 mg/kg (original dose 4 mg/kg, Cycle 1, Reason: Other (see comments), Comment: load), 6 mg/kg (original dose 2 mg/kg, Cycle 3, Reason: Other (see comments), Comment: q3wk dosing), 2 mg/kg (original dose 2 mg/kg, Cycle 3, Reason: Other (see comments)), 450 mg (original dose 2 mg/kg, Cycle 3, Reason: Other (see comments), Comment: Maintenance dosing), 450 mg (original dose 6 mg/kg, Cycle 4, Reason: Other (see comments), Comment: rounded; vial size) Administration: 300 mg (05/15/2019), 150 mg (05/22/2019), 150 mg (05/29/2019), 150 mg (06/05/2019), 150 mg (06/12/2019), 150 mg (06/19/2019)  for chemotherapy treatment.      CHIEF COMPLIANT: Cycle7Taxol Kanjinti  INTERVAL HISTORY: BMARCELLE HEPNERis a 63y.o. with above-mentioned history of right breast cancerwho underwent a lumpectomyand is currently onadjuvant chemotherapy with Taxol andKanjinti. She is a participant in the [employer] clinical trial.She presents to the clinic todayfor cycle 7.   REVIEW OF SYSTEMS:   Constitutional: Denies fevers, chills or abnormal weight loss Eyes: Denies blurriness of vision Ears, nose, mouth, throat, and face: Denies mucositis or sore throat Respiratory: Denies cough, dyspnea or wheezes Cardiovascular: Denies palpitation, chest discomfort Gastrointestinal: Denies nausea, heartburn or change  in bowel habits Skin: Denies abnormal skin rashes Lymphatics: Denies new lymphadenopathy or easy bruising Neurological: Denies numbness, tingling or new  weaknesses Behavioral/Psych: Mood is stable, no new changes  Extremities: No lower extremity edema Breast: denies any pain or lumps or nodules in either breasts All other systems were reviewed with the patient and are negative.  I have reviewed the past medical history, past surgical history, social history and family history with the patient and they are unchanged from previous note.  ALLERGIES:  has No Known Allergies.  MEDICATIONS:  Current Outpatient Medications  Medication Sig Dispense Refill   Ascorbic Acid (VITAMIN C PO) Take 1 tablet by mouth daily.     b complex vitamins tablet Take 1 tablet by mouth daily.     Carboxymethylcellul-Glycerin (LUBRICATING EYE DROPS OP) Place 1 drop into both eyes daily as needed (dry eyes).     Cholecalciferol (DIALYVITE VITAMIN D 5000) 125 MCG (5000 UT) capsule Take 5,000 Units by mouth daily.     Coenzyme Q10 (CO Q-10 PO) Take 1 capsule by mouth daily.     diphenhydrAMINE (BENADRYL) 25 MG tablet Take 25 mg by mouth daily as needed for allergies.     hydrochlorothiazide (HYDRODIURIL) 25 MG tablet Take 1 tablet (25 mg total) by mouth daily. 90 tablet 1   ibuprofen (ADVIL) 600 MG tablet Take 1 tablet (600 mg total) by mouth every 6 (six) hours as needed. 30 tablet 2   lidocaine-prilocaine (EMLA) cream Apply to affected area once 30 g 3   LORazepam (ATIVAN) 0.5 MG tablet Take 1 tablet (0.5 mg total) by mouth at bedtime as needed for sleep. 30 tablet 0   MAGNESIUM GLUCONATE PO Take 2 capsules by mouth daily.     metoprolol tartrate (LOPRESSOR) 50 MG tablet Take 1 tablet (50 mg total) by mouth 2 (two) times daily. 180 tablet 1   ondansetron (ZOFRAN) 8 MG tablet Take 1 tablet (8 mg total) by mouth 2 (two) times daily as needed (Nausea or vomiting). 30 tablet 1   prochlorperazine (COMPAZINE) 10 MG tablet Take 1 tablet (10 mg total) by mouth every 6 (six) hours as needed (Nausea or vomiting). 30 tablet 1   Red Yeast Rice Extract (RED YEAST  RICE PO) Take 2 capsules by mouth daily.     simvastatin (ZOCOR) 10 MG tablet Take 1 tablet (10 mg total) by mouth every evening. 90 tablet 1   No current facility-administered medications for this visit.     PHYSICAL EXAMINATION: ECOG PERFORMANCE STATUS: 1 - Symptomatic but completely ambulatory  Vitals:   06/26/19 1059  BP: 140/90  Pulse: 74  Resp: 18  Temp: 97.8 F (36.6 C)  SpO2: 100%   Filed Weights   06/26/19 1059  Weight: 167 lb 14.4 oz (76.2 kg)    GENERAL: alert, no distress and comfortable SKIN: skin color, texture, turgor are normal, no rashes or significant lesions EYES: normal, Conjunctiva are pink and non-injected, sclera clear OROPHARYNX: no exudate, no erythema and lips, buccal mucosa, and tongue normal  NECK: supple, thyroid normal size, non-tender, without nodularity LYMPH: no palpable lymphadenopathy in the cervical, axillary or inguinal LUNGS: clear to auscultation and percussion with normal breathing effort HEART: regular rate & rhythm and no murmurs and no lower extremity edema ABDOMEN: abdomen soft, non-tender and normal bowel sounds MUSCULOSKELETAL: no cyanosis of digits and no clubbing  NEURO: alert & oriented x 3 with fluent speech, no focal motor/sensory deficits EXTREMITIES: No lower extremity edema  LABORATORY DATA:  I have reviewed the data as listed CMP Latest Ref Rng & Units 06/19/2019 06/12/2019 06/05/2019  Glucose 70 - 99 mg/dL 99 115(H) 103(H)  BUN 8 - 23 mg/dL 18 17 17   Creatinine 0.44 - 1.00 mg/dL 0.87 0.80 0.95  Sodium 135 - 145 mmol/L 138 139 139  Potassium 3.5 - 5.1 mmol/L 3.7 3.5 3.5  Chloride 98 - 111 mmol/L 102 104 103  CO2 22 - 32 mmol/L 28 29 27   Calcium 8.9 - 10.3 mg/dL 9.6 9.7 9.5  Total Protein 6.5 - 8.1 g/dL 7.1 7.1 7.0  Total Bilirubin 0.3 - 1.2 mg/dL 0.3 0.3 0.3  Alkaline Phos 38 - 126 U/L 61 63 63  AST 15 - 41 U/L 18 19 21   ALT 0 - 44 U/L 16 18 24     Lab Results  Component Value Date   WBC 3.6 (L) 06/26/2019    HGB 9.9 (L) 06/26/2019   HCT 30.4 (L) 06/26/2019   MCV 95.9 06/26/2019   PLT 300 06/26/2019   NEUTROABS 2.0 06/26/2019    ASSESSMENT & PLAN:  Carcinoma of upper-inner quadrant of right breast in female, estrogen receptor positive ([place]) 04/02/2019:Screening mammogram detected a mass in the right axilla. [place] confirmed a 0.9cm right breast mass. Biopsy showed IDC, grade 2, HER-2+ (3+), ER+ 100%, PR+ 5%, Ki67 15%.  Treatment plan: 1.Breast conserving surgery with sentinel lymph node biopsy 2.Adjuvant chemotherapy with Taxol Herceptin weekly x12 followed by Herceptin maintenance for 1 year started8/14/2020 3.Adjuvant radiation therapy 4.Followed by adjuvant antiestrogen therapy with anastrozole 1 mg daily x5 to 7 years ------------------------------------------------------------------------------------------------------------------------------------------- Current treatment: Cycle7Taxol Herceptin Echocardiogram 04/24/2019:: EF 60 to 65%  Chemo toxicities: Denies any nausea or vomiting. Mild fatigue. She is able to continue her full activities including exercises and running track 6 days a week. 1. Leukopenia: Monitoring very closely.  Her ANC is 2.   2.  Chemotherapy-induced anemia: Monitoring closely hemoglobin today is 9.9 3.  Mild peripheral neuropathy: Patient noticed occasional tingling but it has not persisted so we will keep the dosage the same.  Return to clinic weekly for chemo and I will see her every other week for follow-up with me.  No orders of the defined types were placed in this encounter.  The patient has a good understanding of the overall plan. she agrees with it. she will call with any problems that may develop before the next visit here.  Nicholas Lose, MD 06/26/2019  Julious Oka Dorshimer am acting as scribe for Dr. Nicholas Lose.  I have reviewed the above documentation for accuracy and completeness, and I agree with the above.

## 2019-06-26 ENCOUNTER — Inpatient Hospital Stay: Payer: 59

## 2019-06-26 ENCOUNTER — Inpatient Hospital Stay (HOSPITAL_BASED_OUTPATIENT_CLINIC_OR_DEPARTMENT_OTHER): Payer: 59 | Admitting: Hematology and Oncology

## 2019-06-26 ENCOUNTER — Other Ambulatory Visit: Payer: Self-pay

## 2019-06-26 ENCOUNTER — Encounter: Payer: Self-pay | Admitting: *Deleted

## 2019-06-26 DIAGNOSIS — C50411 Malignant neoplasm of upper-outer quadrant of right female breast: Secondary | ICD-10-CM | POA: Diagnosis not present

## 2019-06-26 DIAGNOSIS — Z170 Estrogen receptor positive status [ER+]: Secondary | ICD-10-CM | POA: Diagnosis not present

## 2019-06-26 DIAGNOSIS — C50211 Malignant neoplasm of upper-inner quadrant of right female breast: Secondary | ICD-10-CM | POA: Diagnosis not present

## 2019-06-26 DIAGNOSIS — Z95828 Presence of other vascular implants and grafts: Secondary | ICD-10-CM

## 2019-06-26 LAB — CBC WITH DIFFERENTIAL (CANCER CENTER ONLY)
Abs Immature Granulocytes: 0.02 10*3/uL (ref 0.00–0.07)
Basophils Absolute: 0 10*3/uL (ref 0.0–0.1)
Basophils Relative: 1 %
Eosinophils Absolute: 0.1 10*3/uL (ref 0.0–0.5)
Eosinophils Relative: 3 %
HCT: 30.4 % — ABNORMAL LOW (ref 36.0–46.0)
Hemoglobin: 9.9 g/dL — ABNORMAL LOW (ref 12.0–15.0)
Immature Granulocytes: 1 %
Lymphocytes Relative: 34 %
Lymphs Abs: 1.2 10*3/uL (ref 0.7–4.0)
MCH: 31.2 pg (ref 26.0–34.0)
MCHC: 32.6 g/dL (ref 30.0–36.0)
MCV: 95.9 fL (ref 80.0–100.0)
Monocytes Absolute: 0.3 10*3/uL (ref 0.1–1.0)
Monocytes Relative: 7 %
Neutro Abs: 2 10*3/uL (ref 1.7–7.7)
Neutrophils Relative %: 54 %
Platelet Count: 300 10*3/uL (ref 150–400)
RBC: 3.17 MIL/uL — ABNORMAL LOW (ref 3.87–5.11)
RDW: 14.5 % (ref 11.5–15.5)
WBC Count: 3.6 10*3/uL — ABNORMAL LOW (ref 4.0–10.5)
nRBC: 0 % (ref 0.0–0.2)

## 2019-06-26 LAB — CMP (CANCER CENTER ONLY)
ALT: 14 U/L (ref 0–44)
AST: 18 U/L (ref 15–41)
Albumin: 3.9 g/dL (ref 3.5–5.0)
Alkaline Phosphatase: 57 U/L (ref 38–126)
Anion gap: 6 (ref 5–15)
BUN: 23 mg/dL (ref 8–23)
CO2: 29 mmol/L (ref 22–32)
Calcium: 9.7 mg/dL (ref 8.9–10.3)
Chloride: 104 mmol/L (ref 98–111)
Creatinine: 0.75 mg/dL (ref 0.44–1.00)
GFR, Est AFR Am: >60 mL/min (ref >60)
GFR, Estimated: >60 mL/min (ref >60)
Glucose, Bld: 98 mg/dL (ref 70–99)
Potassium: 3.8 mmol/L (ref 3.5–5.1)
Sodium: 139 mmol/L (ref 135–145)
Total Bilirubin: 0.3 mg/dL (ref 0.3–1.2)
Total Protein: 7 g/dL (ref 6.5–8.1)

## 2019-06-26 MED ORDER — DIPHENHYDRAMINE HCL 25 MG PO CAPS
ORAL_CAPSULE | ORAL | Status: AC
  Filled 2019-06-26: qty 1

## 2019-06-26 MED ORDER — DIPHENHYDRAMINE HCL 50 MG/ML IJ SOLN
INTRAMUSCULAR | Status: AC
  Filled 2019-06-26: qty 1

## 2019-06-26 MED ORDER — DEXAMETHASONE SODIUM PHOSPHATE 10 MG/ML IJ SOLN
INTRAMUSCULAR | Status: AC
  Filled 2019-06-26: qty 1

## 2019-06-26 MED ORDER — FAMOTIDINE IN NACL 20-0.9 MG/50ML-% IV SOLN
20.0000 mg | Freq: Once | INTRAVENOUS | Status: AC
  Administered 2019-06-26: 20 mg via INTRAVENOUS

## 2019-06-26 MED ORDER — SODIUM CHLORIDE 0.9% FLUSH
10.0000 mL | INTRAVENOUS | Status: DC | PRN
  Administered 2019-06-26: 10 mL
  Filled 2019-06-26: qty 10

## 2019-06-26 MED ORDER — ACETAMINOPHEN 325 MG PO TABS
650.0000 mg | ORAL_TABLET | Freq: Once | ORAL | Status: AC
  Administered 2019-06-26: 650 mg via ORAL

## 2019-06-26 MED ORDER — ACETAMINOPHEN 325 MG PO TABS
ORAL_TABLET | ORAL | Status: AC
  Filled 2019-06-26: qty 2

## 2019-06-26 MED ORDER — SODIUM CHLORIDE 0.9 % IV SOLN
Freq: Once | INTRAVENOUS | Status: AC
  Administered 2019-06-26: 12:00:00 via INTRAVENOUS
  Filled 2019-06-26: qty 250

## 2019-06-26 MED ORDER — DEXAMETHASONE SODIUM PHOSPHATE 10 MG/ML IJ SOLN
10.0000 mg | Freq: Once | INTRAMUSCULAR | Status: AC
  Administered 2019-06-26: 10 mg via INTRAVENOUS

## 2019-06-26 MED ORDER — HEPARIN SOD (PORK) LOCK FLUSH 100 UNIT/ML IV SOLN
500.0000 [IU] | Freq: Once | INTRAVENOUS | Status: AC | PRN
  Administered 2019-06-26: 500 [IU]
  Filled 2019-06-26: qty 5

## 2019-06-26 MED ORDER — SODIUM CHLORIDE 0.9 % IV SOLN
80.0000 mg/m2 | Freq: Once | INTRAVENOUS | Status: AC
  Administered 2019-06-26: 144 mg via INTRAVENOUS
  Filled 2019-06-26: qty 24

## 2019-06-26 MED ORDER — TRASTUZUMAB-ANNS CHEMO 150 MG IV SOLR
150.0000 mg | Freq: Once | INTRAVENOUS | Status: AC
  Administered 2019-06-26: 150 mg via INTRAVENOUS
  Filled 2019-06-26: qty 7.14

## 2019-06-26 MED ORDER — DIPHENHYDRAMINE HCL 50 MG/ML IJ SOLN
25.0000 mg | Freq: Once | INTRAMUSCULAR | Status: AC
  Administered 2019-06-26: 25 mg via INTRAVENOUS

## 2019-06-26 MED ORDER — FAMOTIDINE IN NACL 20-0.9 MG/50ML-% IV SOLN
INTRAVENOUS | Status: AC
  Filled 2019-06-26: qty 50

## 2019-06-26 NOTE — Assessment & Plan Note (Signed)
04/02/2019:Screening mammogram detected a mass in the right axilla. [place] confirmed a 0.9cm right breast mass. Biopsy showed IDC, grade 2, HER-2+ (3+), ER+ 100%, PR+ 5%, Ki67 15%.  Treatment plan: 1.Breast conserving surgery with sentinel lymph node biopsy 2.Adjuvant chemotherapy with Taxol Herceptin weekly x12 followed by Herceptin maintenance for 1 year started8/14/2020 3.Adjuvant radiation therapy 4.Followed by adjuvant antiestrogen therapy with anastrozole 1 mg daily x5 to 7 years ------------------------------------------------------------------------------------------------------------------------------------------- Current treatment: Cycle7Taxol Herceptin Echocardiogram 04/24/2019:: EF 60 to 65%  Chemo toxicities: Denies any nausea or vomiting. Mild fatigue. She is able to continue her full activities including exercises. 1. Leukopenia: Monitoring very closely.  Her ANC is .     Return to clinic weekly for chemo and I will see her every other week for follow-up with me.

## 2019-06-26 NOTE — Patient Instructions (Signed)
[facility] Discharge Instructions for Patients Receiving Chemotherapy  Today you received the following chemotherapy agents Trastuzumab-anns Calla Kicks) & Paclitaxel (TAXOL).  To help prevent nausea and vomiting after your treatment, we encourage you to take your nausea medication as prescribed.  If you develop nausea and vomiting that is not controlled by your nausea medication, call the clinic.   BELOW ARE SYMPTOMS THAT SHOULD BE REPORTED IMMEDIATELY:  *FEVER GREATER THAN 100.5 F  *CHILLS WITH OR WITHOUT FEVER  NAUSEA AND VOMITING THAT IS NOT CONTROLLED WITH YOUR NAUSEA MEDICATION  *UNUSUAL SHORTNESS OF BREATH  *UNUSUAL BRUISING OR BLEEDING  TENDERNESS IN MOUTH AND THROAT WITH OR WITHOUT PRESENCE OF ULCERS  *URINARY PROBLEMS  *BOWEL PROBLEMS  UNUSUAL RASH Items with * indicate a potential emergency and should be followed up as soon as possible.  Feel free to call the clinic should you have any questions or concerns. The clinic phone number is [phone].  Please show the [place] [facility] at check-in to the Emergency Department and triage nurse.  Coronavirus (COVID-19) Are you at risk?  Are you at risk for the Coronavirus (COVID-19)?  To be considered HIGH RISK for Coronavirus (COVID-19), you have to meet the following criteria:  . Traveled to [place], [place], [place], [place] or [place]; or in the [place] to [place], [place], [place], or [place]; and have fever, cough, and shortness of breath within the last 2 weeks of travel OR . Been in close contact with a person diagnosed with COVID-19 within the last 2 weeks and have fever, cough, and shortness of breath . IF YOU DO NOT MEET THESE CRITERIA, YOU ARE CONSIDERED LOW RISK FOR COVID-19.  What to do if you are HIGH RISK for COVID-19?  Marland Kitchen If you are having a medical emergency, call 911. . Seek medical care right away. Before you go to a doctor's office, urgent care or emergency department,  call ahead and tell them about your recent travel, contact with someone diagnosed with COVID-19, and your symptoms. You should receive instructions from your physician's office regarding next steps of care.  . When you arrive at healthcare provider, tell the healthcare staff immediately you have returned from visiting [place], [place], [place], [place] or [place]; or traveled in the [place] to [place], [place], [place], or [place]; in the last two weeks or you have been in close contact with a person diagnosed with COVID-19 in the last 2 weeks.   . Tell the health care staff about your symptoms: fever, cough and shortness of breath. . After you have been seen by a medical provider, you will be either: o Tested for (COVID-19) and discharged home on quarantine except to seek medical care if symptoms worsen, and asked to  - Stay home and avoid contact with others until you get your results (4-5 days)  - Avoid travel on public transportation if possible (such as bus, train, or airplane) or o Sent to the Emergency Department by EMS for evaluation, COVID-19 testing, and possible admission depending on your condition and test results.  What to do if you are LOW RISK for COVID-19?  Reduce your risk of any infection by using the same precautions used for avoiding the common cold or flu:  Marland Kitchen Wash your hands often with soap and warm water for at least 20 seconds.  If soap and water are not readily available, use an alcohol-based hand sanitizer with at least 60% alcohol.  . If  coughing or sneezing, cover your mouth and nose by coughing or sneezing into the elbow areas of your shirt or coat, into a tissue or into your sleeve (not your hands). . Avoid shaking hands with others and consider head nods or verbal greetings only. . Avoid touching your eyes, nose, or mouth with unwashed hands.  . Avoid close contact with people who are sick. . Avoid places or events with large numbers of people in one  location, like concerts or sporting events. . Carefully consider travel plans you have or are making. . If you are planning any travel outside or inside the [place], visit the CDC's Travelers' Health webpage for the latest health notices. . If you have some symptoms but not all symptoms, continue to monitor at home and seek medical attention if your symptoms worsen. . If you are having a medical emergency, call 911.   [place] / e-Visit: [email or web address]         [facility]: [facility]: [phone]                   [facility]: [phone]

## 2019-07-03 ENCOUNTER — Inpatient Hospital Stay: Payer: 59 | Attending: Hematology and Oncology

## 2019-07-03 ENCOUNTER — Inpatient Hospital Stay: Payer: 59

## 2019-07-03 ENCOUNTER — Other Ambulatory Visit: Payer: Self-pay

## 2019-07-03 VITALS — BP 146/89 | HR 71 | Temp 98.5°F | Resp 18

## 2019-07-03 DIAGNOSIS — Z170 Estrogen receptor positive status [ER+]: Secondary | ICD-10-CM | POA: Diagnosis not present

## 2019-07-03 DIAGNOSIS — C50211 Malignant neoplasm of upper-inner quadrant of right female breast: Secondary | ICD-10-CM

## 2019-07-03 DIAGNOSIS — Z79899 Other long term (current) drug therapy: Secondary | ICD-10-CM | POA: Diagnosis not present

## 2019-07-03 DIAGNOSIS — Z5111 Encounter for antineoplastic chemotherapy: Secondary | ICD-10-CM | POA: Insufficient documentation

## 2019-07-03 DIAGNOSIS — D72819 Decreased white blood cell count, unspecified: Secondary | ICD-10-CM | POA: Insufficient documentation

## 2019-07-03 DIAGNOSIS — C50411 Malignant neoplasm of upper-outer quadrant of right female breast: Secondary | ICD-10-CM | POA: Diagnosis present

## 2019-07-03 DIAGNOSIS — G629 Polyneuropathy, unspecified: Secondary | ICD-10-CM | POA: Diagnosis not present

## 2019-07-03 DIAGNOSIS — D6481 Anemia due to antineoplastic chemotherapy: Secondary | ICD-10-CM | POA: Insufficient documentation

## 2019-07-03 DIAGNOSIS — Z95828 Presence of other vascular implants and grafts: Secondary | ICD-10-CM

## 2019-07-03 LAB — CBC WITH DIFFERENTIAL (CANCER CENTER ONLY)
Abs Immature Granulocytes: 0.03 10*3/uL (ref 0.00–0.07)
Basophils Absolute: 0 10*3/uL (ref 0.0–0.1)
Basophils Relative: 1 %
Eosinophils Absolute: 0.1 10*3/uL (ref 0.0–0.5)
Eosinophils Relative: 3 %
HCT: 29.9 % — ABNORMAL LOW (ref 36.0–46.0)
Hemoglobin: 9.9 g/dL — ABNORMAL LOW (ref 12.0–15.0)
Immature Granulocytes: 1 %
Lymphocytes Relative: 34 %
Lymphs Abs: 1.3 10*3/uL (ref 0.7–4.0)
MCH: 31.4 pg (ref 26.0–34.0)
MCHC: 33.1 g/dL (ref 30.0–36.0)
MCV: 94.9 fL (ref 80.0–100.0)
Monocytes Absolute: 0.3 10*3/uL (ref 0.1–1.0)
Monocytes Relative: 7 %
Neutro Abs: 2 10*3/uL (ref 1.7–7.7)
Neutrophils Relative %: 54 %
Platelet Count: 303 10*3/uL (ref 150–400)
RBC: 3.15 MIL/uL — ABNORMAL LOW (ref 3.87–5.11)
RDW: 15.1 % (ref 11.5–15.5)
WBC Count: 3.7 10*3/uL — ABNORMAL LOW (ref 4.0–10.5)
nRBC: 0 % (ref 0.0–0.2)

## 2019-07-03 LAB — CMP (CANCER CENTER ONLY)
ALT: 18 U/L (ref 0–44)
AST: 19 U/L (ref 15–41)
Albumin: 3.8 g/dL (ref 3.5–5.0)
Alkaline Phosphatase: 63 U/L (ref 38–126)
Anion gap: 7 (ref 5–15)
BUN: 25 mg/dL — ABNORMAL HIGH (ref 8–23)
CO2: 29 mmol/L (ref 22–32)
Calcium: 9.6 mg/dL (ref 8.9–10.3)
Chloride: 102 mmol/L (ref 98–111)
Creatinine: 0.79 mg/dL (ref 0.44–1.00)
GFR, Est AFR Am: >60 mL/min (ref >60)
GFR, Estimated: >60 mL/min (ref >60)
Glucose, Bld: 100 mg/dL — ABNORMAL HIGH (ref 70–99)
Potassium: 3.7 mmol/L (ref 3.5–5.1)
Sodium: 138 mmol/L (ref 135–145)
Total Bilirubin: 0.3 mg/dL (ref 0.3–1.2)
Total Protein: 6.9 g/dL (ref 6.5–8.1)

## 2019-07-03 MED ORDER — DEXAMETHASONE SODIUM PHOSPHATE 10 MG/ML IJ SOLN
10.0000 mg | Freq: Once | INTRAMUSCULAR | Status: AC
  Administered 2019-07-03: 10 mg via INTRAVENOUS

## 2019-07-03 MED ORDER — DIPHENHYDRAMINE HCL 50 MG/ML IJ SOLN
INTRAMUSCULAR | Status: AC
  Filled 2019-07-03: qty 1

## 2019-07-03 MED ORDER — SODIUM CHLORIDE 0.9 % IV SOLN
Freq: Once | INTRAVENOUS | Status: AC
  Administered 2019-07-03: 14:00:00 via INTRAVENOUS
  Filled 2019-07-03: qty 250

## 2019-07-03 MED ORDER — TRASTUZUMAB-ANNS CHEMO 150 MG IV SOLR
150.0000 mg | Freq: Once | INTRAVENOUS | Status: AC
  Administered 2019-07-03: 150 mg via INTRAVENOUS
  Filled 2019-07-03: qty 7.14

## 2019-07-03 MED ORDER — FAMOTIDINE IN NACL 20-0.9 MG/50ML-% IV SOLN
20.0000 mg | Freq: Once | INTRAVENOUS | Status: AC
  Administered 2019-07-03: 20 mg via INTRAVENOUS

## 2019-07-03 MED ORDER — ACETAMINOPHEN 325 MG PO TABS
ORAL_TABLET | ORAL | Status: AC
  Filled 2019-07-03: qty 2

## 2019-07-03 MED ORDER — DIPHENHYDRAMINE HCL 50 MG/ML IJ SOLN
25.0000 mg | Freq: Once | INTRAMUSCULAR | Status: AC
  Administered 2019-07-03: 25 mg via INTRAVENOUS

## 2019-07-03 MED ORDER — DEXAMETHASONE SODIUM PHOSPHATE 10 MG/ML IJ SOLN
INTRAMUSCULAR | Status: AC
  Filled 2019-07-03: qty 1

## 2019-07-03 MED ORDER — SODIUM CHLORIDE 0.9 % IV SOLN
80.0000 mg/m2 | Freq: Once | INTRAVENOUS | Status: AC
  Administered 2019-07-03: 144 mg via INTRAVENOUS
  Filled 2019-07-03: qty 24

## 2019-07-03 MED ORDER — FAMOTIDINE IN NACL 20-0.9 MG/50ML-% IV SOLN
INTRAVENOUS | Status: AC
  Filled 2019-07-03: qty 50

## 2019-07-03 MED ORDER — SODIUM CHLORIDE 0.9% FLUSH
10.0000 mL | INTRAVENOUS | Status: DC | PRN
  Administered 2019-07-03: 10 mL
  Filled 2019-07-03: qty 10

## 2019-07-03 MED ORDER — ACETAMINOPHEN 325 MG PO TABS
650.0000 mg | ORAL_TABLET | Freq: Once | ORAL | Status: AC
  Administered 2019-07-03: 650 mg via ORAL

## 2019-07-03 MED ORDER — HEPARIN SOD (PORK) LOCK FLUSH 100 UNIT/ML IV SOLN
500.0000 [IU] | Freq: Once | INTRAVENOUS | Status: AC | PRN
  Administered 2019-07-03: 500 [IU]
  Filled 2019-07-03: qty 5

## 2019-07-03 NOTE — Patient Instructions (Signed)
[facility] Discharge Instructions for Patients Receiving Chemotherapy  Today you received the following chemotherapy agents Trastuzumab-anns Calla Kicks) & Paclitaxel (TAXOL).  To help prevent nausea and vomiting after your treatment, we encourage you to take your nausea medication as prescribed.  If you develop nausea and vomiting that is not controlled by your nausea medication, call the clinic.   BELOW ARE SYMPTOMS THAT SHOULD BE REPORTED IMMEDIATELY:  *FEVER GREATER THAN 100.5 F  *CHILLS WITH OR WITHOUT FEVER  NAUSEA AND VOMITING THAT IS NOT CONTROLLED WITH YOUR NAUSEA MEDICATION  *UNUSUAL SHORTNESS OF BREATH  *UNUSUAL BRUISING OR BLEEDING  TENDERNESS IN MOUTH AND THROAT WITH OR WITHOUT PRESENCE OF ULCERS  *URINARY PROBLEMS  *BOWEL PROBLEMS  UNUSUAL RASH Items with * indicate a potential emergency and should be followed up as soon as possible.  Feel free to call the clinic should you have any questions or concerns. The clinic phone number is [phone].  Please show the [place] [facility] at check-in to the Emergency Department and triage nurse.  Coronavirus (COVID-19) Are you at risk?  Are you at risk for the Coronavirus (COVID-19)?  To be considered HIGH RISK for Coronavirus (COVID-19), you have to meet the following criteria:  . Traveled to [place], [place], [place], [place] or [place]; or in the [place] to [place], [place], [place], or [place]; and have fever, cough, and shortness of breath within the last 2 weeks of travel OR . Been in close contact with a person diagnosed with COVID-19 within the last 2 weeks and have fever, cough, and shortness of breath . IF YOU DO NOT MEET THESE CRITERIA, YOU ARE CONSIDERED LOW RISK FOR COVID-19.  What to do if you are HIGH RISK for COVID-19?  Marland Kitchen If you are having a medical emergency, call 911. . Seek medical care right away. Before you go to a doctor's office, urgent care or emergency department,  call ahead and tell them about your recent travel, contact with someone diagnosed with COVID-19, and your symptoms. You should receive instructions from your physician's office regarding next steps of care.  . When you arrive at healthcare provider, tell the healthcare staff immediately you have returned from visiting [place], [place], [place], [place] or [place]; or traveled in the [place] to [place], [place], [place], or [place]; in the last two weeks or you have been in close contact with a person diagnosed with COVID-19 in the last 2 weeks.   . Tell the health care staff about your symptoms: fever, cough and shortness of breath. . After you have been seen by a medical provider, you will be either: o Tested for (COVID-19) and discharged home on quarantine except to seek medical care if symptoms worsen, and asked to  - Stay home and avoid contact with others until you get your results (4-5 days)  - Avoid travel on public transportation if possible (such as bus, train, or airplane) or o Sent to the Emergency Department by EMS for evaluation, COVID-19 testing, and possible admission depending on your condition and test results.  What to do if you are LOW RISK for COVID-19?  Reduce your risk of any infection by using the same precautions used for avoiding the common cold or flu:  Marland Kitchen Wash your hands often with soap and warm water for at least 20 seconds.  If soap and water are not readily available, use an alcohol-based hand sanitizer with at least 60% alcohol.  . If  coughing or sneezing, cover your mouth and nose by coughing or sneezing into the elbow areas of your shirt or coat, into a tissue or into your sleeve (not your hands). . Avoid shaking hands with others and consider head nods or verbal greetings only. . Avoid touching your eyes, nose, or mouth with unwashed hands.  . Avoid close contact with people who are sick. . Avoid places or events with large numbers of people in one  location, like concerts or sporting events. . Carefully consider travel plans you have or are making. . If you are planning any travel outside or inside the [place], visit the CDC's Travelers' Health webpage for the latest health notices. . If you have some symptoms but not all symptoms, continue to monitor at home and seek medical attention if your symptoms worsen. . If you are having a medical emergency, call 911.   [place] / e-Visit: [email or web address]         [facility]: [facility]: [phone]                   [facility]: [phone]

## 2019-07-09 NOTE — Progress Notes (Signed)
Patient Care Team: Minette Brine, [place] as PCP - General (General Practice) Mauro Kaufmann, RN as Oncology Nurse Navigator Rockwell Germany, RN as Oncology Nurse Navigator Nicholas Lose, MD as Consulting Physician (Hematology and Oncology) Rolm Bookbinder, MD as Consulting Physician (General Surgery) Eppie Gibson, MD as Attending Physician (Radiation Oncology)  DIAGNOSIS:    ICD-10-CM   1. Malignant neoplasm of upper-outer quadrant of right breast in female, estrogen receptor positive ([place])  C50.411    Z17.0     SUMMARY OF ONCOLOGIC HISTORY: Oncology History  Malignant neoplasm of upper-outer quadrant of right breast in female, estrogen receptor positive ([place])  04/02/2019 Initial Diagnosis   Screening mammogram detected a mass in the right axilla. [place] confirmed a 0.9cm right breast mass. Biopsy showed IDC, grade 2, HER-2+ (3+), ER+ 100%, PR+ 5%, Ki67 15%.    04/08/2019 Cancer Staging   Staging form: Breast, AJCC 8th Edition - Clinical stage from 04/08/2019: Stage IA (cT1b, cN0, cM0, G2, ER+, PR+, HER2+) - Signed by Nicholas Lose, MD on 04/08/2019   04/14/2019 Genetic Testing   Patient had genetic testing done for her personal and family history of breast cancer.  Negative genetic testing on the [facility] Breast Cancer STAT panel. The STAT Breast cancer panel offered by [facility] includes sequencing and rearrangement analysis for the following 9 genes:  ATM, BRCA1, BRCA2, CDH1, CHEK2, PALB2, PTEN, STK11 and TP53.  The report date is 04/14/2019.   04/17/2019 Surgery   Right lumpectomy Emily Ross): IDC, 2.0cm, grade 2, clear margins, 5 axillary lymph nodes negative for carcinoma   Carcinoma of upper-inner quadrant of right breast in female, estrogen receptor positive ([place])  04/08/2019 Cancer Staging   Staging form: Breast, AJCC 8th Edition - Clinical stage from 04/08/2019: Stage IA (cT1b, cN0, cM0, G2, ER+, PR+, HER2+) - Signed by Eppie Gibson, MD on 04/08/2019   05/15/2019 -  Chemotherapy   The  patient had PACLitaxel (TAXOL) 144 mg in sodium chloride 0.9 % 250 mL chemo infusion (</= 59m/m2), 80 mg/m2 = 144 mg, Intravenous,  Once, 2 of 3 cycles Administration: 144 mg (05/15/2019), 144 mg (05/22/2019), 144 mg (06/12/2019), 144 mg (05/29/2019), 144 mg (06/05/2019), 144 mg (06/19/2019), 144 mg (06/26/2019), 144 mg (07/03/2019) trastuzumab-anns (KANJINTI) 300 mg in sodium chloride 0.9 % 250 mL chemo infusion, 294 mg (100 % of original dose 4 mg/kg), Intravenous,  Once, 2 of 16 cycles Dose modification: 4 mg/kg (original dose 4 mg/kg, Cycle 1, Reason: Other (see comments), Comment: load), 6 mg/kg (original dose 2 mg/kg, Cycle 3, Reason: Other (see comments), Comment: q3wk dosing), 2 mg/kg (original dose 2 mg/kg, Cycle 3, Reason: Other (see comments)), 450 mg (original dose 2 mg/kg, Cycle 3, Reason: Other (see comments), Comment: Maintenance dosing), 450 mg (original dose 6 mg/kg, Cycle 4, Reason: Other (see comments), Comment: rounded; vial size) Administration: 300 mg (05/15/2019), 150 mg (05/22/2019), 150 mg (05/29/2019), 150 mg (06/05/2019), 150 mg (06/12/2019), 150 mg (06/19/2019), 150 mg (06/26/2019), 150 mg (07/03/2019)  for chemotherapy treatment.      CHIEF COMPLIANT: Cycle9Taxol Kanjinti  INTERVAL HISTORY: Emily MINERis a 63y.o. with above-mentioned history of right breast cancerwho underwent a lumpectomyand is currently onadjuvant chemotherapy with Taxol andKanjinti. She is a participant in the [employer] clinical trial.She presents to the clinic todayforcycle 9.  She does not have any neuropathy.  She had mild fatigue for 1 day.  She is super excited that her son is coming back from [place] visit her.  REVIEW OF SYSTEMS:  Constitutional: Denies fevers, chills or abnormal weight loss Eyes: Denies blurriness of vision Ears, nose, mouth, throat, and face: Denies mucositis or sore throat Respiratory: Denies cough, dyspnea or wheezes Cardiovascular: Denies palpitation, chest discomfort  Gastrointestinal: Denies nausea, heartburn or change in bowel habits Skin: Denies abnormal skin rashes Lymphatics: Denies new lymphadenopathy or easy bruising Neurological: Denies numbness, tingling or new weaknesses Behavioral/Psych: Mood is stable, no new changes  Extremities: No lower extremity edema Breast: denies any pain or lumps or nodules in either breasts All other systems were reviewed with the patient and are negative.  I have reviewed the past medical history, past surgical history, social history and family history with the patient and they are unchanged from previous note.  ALLERGIES:  has No Known Allergies.  MEDICATIONS:  Current Outpatient Medications  Medication Sig Dispense Refill  . Ascorbic Acid (VITAMIN C PO) Take 1 tablet by mouth daily.    Marland Kitchen b complex vitamins tablet Take 1 tablet by mouth daily.    . Carboxymethylcellul-Glycerin (LUBRICATING EYE DROPS OP) Place 1 drop into both eyes daily as needed (dry eyes).    . Cholecalciferol (DIALYVITE VITAMIN D 5000) 125 MCG (5000 UT) capsule Take 5,000 Units by mouth daily.    . Coenzyme Q10 (CO Q-10 PO) Take 1 capsule by mouth daily.    . diphenhydrAMINE (BENADRYL) 25 MG tablet Take 25 mg by mouth daily as needed for allergies.    . hydrochlorothiazide (HYDRODIURIL) 25 MG tablet Take 1 tablet (25 mg total) by mouth daily. 90 tablet 1  . ibuprofen (ADVIL) 600 MG tablet Take 1 tablet (600 mg total) by mouth every 6 (six) hours as needed. 30 tablet 2  . lidocaine-prilocaine (EMLA) cream Apply to affected area once 30 g 3  . LORazepam (ATIVAN) 0.5 MG tablet Take 1 tablet (0.5 mg total) by mouth at bedtime as needed for sleep. 30 tablet 0  . MAGNESIUM GLUCONATE PO Take 2 capsules by mouth daily.    . metoprolol tartrate (LOPRESSOR) 50 MG tablet Take 1 tablet (50 mg total) by mouth 2 (two) times daily. 180 tablet 1  . ondansetron (ZOFRAN) 8 MG tablet Take 1 tablet (8 mg total) by mouth 2 (two) times daily as needed (Nausea or  vomiting). 30 tablet 1  . prochlorperazine (COMPAZINE) 10 MG tablet Take 1 tablet (10 mg total) by mouth every 6 (six) hours as needed (Nausea or vomiting). 30 tablet 1  . Red Yeast Rice Extract (RED YEAST RICE PO) Take 2 capsules by mouth daily.    . simvastatin (ZOCOR) 10 MG tablet Take 1 tablet (10 mg total) by mouth every evening. 90 tablet 1   No current facility-administered medications for this visit.     PHYSICAL EXAMINATION: ECOG PERFORMANCE STATUS: 0 - Asymptomatic  Vitals:   07/10/19 1118  BP: (!) 150/85  Pulse: 82  Resp: 18  Temp: 98 F (36.7 C)  SpO2: 98%   Filed Weights   07/10/19 1118  Weight: 169 lb 8 oz (76.9 kg)    GENERAL: alert, no distress and comfortable SKIN: skin color, texture, turgor are normal, no rashes or significant lesions EYES: normal, Conjunctiva are pink and non-injected, sclera clear OROPHARYNX: no exudate, no erythema and lips, buccal mucosa, and tongue normal  NECK: supple, thyroid normal size, non-tender, without nodularity LYMPH: no palpable lymphadenopathy in the cervical, axillary or inguinal LUNGS: clear to auscultation and percussion with normal breathing effort HEART: regular rate & rhythm and no murmurs and no lower extremity  edema ABDOMEN: abdomen soft, non-tender and normal bowel sounds MUSCULOSKELETAL: no cyanosis of digits and no clubbing  NEURO: alert & oriented x 3 with fluent speech, no focal motor/sensory deficits EXTREMITIES: No lower extremity edema  LABORATORY DATA:  I have reviewed the data as listed CMP Latest Ref Rng & Units 07/03/2019 06/26/2019 06/19/2019  Glucose 70 - 99 mg/dL 100(H) 98 99  BUN 8 - 23 mg/dL 25(H) 23 18  Creatinine 0.44 - 1.00 mg/dL 0.79 0.75 0.87  Sodium 135 - 145 mmol/L 138 139 138  Potassium 3.5 - 5.1 mmol/L 3.7 3.8 3.7  Chloride 98 - 111 mmol/L 102 104 102  CO2 22 - 32 mmol/L 29 29 28   Calcium 8.9 - 10.3 mg/dL 9.6 9.7 9.6  Total Protein 6.5 - 8.1 g/dL 6.9 7.0 7.1  Total Bilirubin 0.3 - 1.2  mg/dL 0.3 0.3 0.3  Alkaline Phos 38 - 126 U/L 63 57 61  AST 15 - 41 U/L 19 18 18   ALT 0 - 44 U/L 18 14 16     Lab Results  Component Value Date   WBC 3.0 (L) 07/10/2019   HGB 9.9 (L) 07/10/2019   HCT 29.7 (L) 07/10/2019   MCV 94.3 07/10/2019   PLT 308 07/10/2019   NEUTROABS 1.7 07/10/2019    ASSESSMENT & PLAN:  Malignant neoplasm of upper-outer quadrant of right breast in female, estrogen receptor positive ([place]) 04/02/2019:Screening mammogram detected a mass in the right axilla. [place] confirmed a 0.9cm right breast mass. Biopsy showed IDC, grade 2, HER-2+ (3+), ER+ 100%, PR+ 5%, Ki67 15%.  Treatment plan: 1.Breast conserving surgery with sentinel lymph node biopsy 2.Adjuvant chemotherapy with Taxol Herceptin weekly x12 followed by Herceptin maintenance for 1 year started8/14/2020 3.Adjuvant radiation therapy 4.Followed by adjuvant antiestrogen therapy with anastrozole 1 mg daily x5 to 7 years ------------------------------------------------------------------------------------------------------------------------------------------- Current treatment: Cycle9Taxol Herceptin Echocardiogram 04/24/2019:: EF 60 to 65%  Chemo toxicities: Denies any nausea or vomiting. Mild fatigue. She is able to continue her full activities including exercises and running track 6 days a week. 1. Leukopenia: Monitoring very closely. Her ANC is  1.7  2.  Chemotherapy-induced anemia: Monitoring closely hemoglobin today is 9.9 3.  Mild peripheral neuropathy: Patient noticed occasional tingling but it has not persisted so we will keep the dosage the same.  Return to clinic weekly for chemo and I will see her every other week for follow-up with me.   No orders of the defined types were placed in this encounter.  The patient has a good understanding of the overall plan. she agrees with it. she will call with any problems that may develop before the next visit here.  Nicholas Lose, MD 07/10/2019   Julious Oka Dorshimer am acting as scribe for Dr. Nicholas Lose.  I have reviewed the above documentation for accuracy and completeness, and I agree with the above.

## 2019-07-10 ENCOUNTER — Other Ambulatory Visit: Payer: Self-pay

## 2019-07-10 ENCOUNTER — Inpatient Hospital Stay: Payer: 59

## 2019-07-10 ENCOUNTER — Inpatient Hospital Stay (HOSPITAL_BASED_OUTPATIENT_CLINIC_OR_DEPARTMENT_OTHER): Payer: 59 | Admitting: Hematology and Oncology

## 2019-07-10 DIAGNOSIS — Z170 Estrogen receptor positive status [ER+]: Secondary | ICD-10-CM

## 2019-07-10 DIAGNOSIS — C50411 Malignant neoplasm of upper-outer quadrant of right female breast: Secondary | ICD-10-CM

## 2019-07-10 DIAGNOSIS — Z95828 Presence of other vascular implants and grafts: Secondary | ICD-10-CM

## 2019-07-10 DIAGNOSIS — C50211 Malignant neoplasm of upper-inner quadrant of right female breast: Secondary | ICD-10-CM

## 2019-07-10 LAB — CMP (CANCER CENTER ONLY)
ALT: 17 U/L (ref 0–44)
AST: 18 U/L (ref 15–41)
Albumin: 3.7 g/dL (ref 3.5–5.0)
Alkaline Phosphatase: 60 U/L (ref 38–126)
Anion gap: 7 (ref 5–15)
BUN: 22 mg/dL (ref 8–23)
CO2: 29 mmol/L (ref 22–32)
Calcium: 9.5 mg/dL (ref 8.9–10.3)
Chloride: 103 mmol/L (ref 98–111)
Creatinine: 0.9 mg/dL (ref 0.44–1.00)
GFR, Est AFR Am: >60 mL/min (ref >60)
GFR, Estimated: >60 mL/min (ref >60)
Glucose, Bld: 105 mg/dL — ABNORMAL HIGH (ref 70–99)
Potassium: 3.6 mmol/L (ref 3.5–5.1)
Sodium: 139 mmol/L (ref 135–145)
Total Bilirubin: 0.3 mg/dL (ref 0.3–1.2)
Total Protein: 6.9 g/dL (ref 6.5–8.1)

## 2019-07-10 LAB — CBC WITH DIFFERENTIAL (CANCER CENTER ONLY)
Abs Immature Granulocytes: 0.02 10*3/uL (ref 0.00–0.07)
Basophils Absolute: 0 10*3/uL (ref 0.0–0.1)
Basophils Relative: 1 %
Eosinophils Absolute: 0.1 10*3/uL (ref 0.0–0.5)
Eosinophils Relative: 3 %
HCT: 29.7 % — ABNORMAL LOW (ref 36.0–46.0)
Hemoglobin: 9.9 g/dL — ABNORMAL LOW (ref 12.0–15.0)
Immature Granulocytes: 1 %
Lymphocytes Relative: 30 %
Lymphs Abs: 0.9 10*3/uL (ref 0.7–4.0)
MCH: 31.4 pg (ref 26.0–34.0)
MCHC: 33.3 g/dL (ref 30.0–36.0)
MCV: 94.3 fL (ref 80.0–100.0)
Monocytes Absolute: 0.2 10*3/uL (ref 0.1–1.0)
Monocytes Relative: 8 %
Neutro Abs: 1.7 10*3/uL (ref 1.7–7.7)
Neutrophils Relative %: 57 %
Platelet Count: 308 10*3/uL (ref 150–400)
RBC: 3.15 MIL/uL — ABNORMAL LOW (ref 3.87–5.11)
RDW: 15.2 % (ref 11.5–15.5)
WBC Count: 3 10*3/uL — ABNORMAL LOW (ref 4.0–10.5)
nRBC: 0 % (ref 0.0–0.2)

## 2019-07-10 MED ORDER — SODIUM CHLORIDE 0.9 % IV SOLN
80.0000 mg/m2 | Freq: Once | INTRAVENOUS | Status: AC
  Administered 2019-07-10: 144 mg via INTRAVENOUS
  Filled 2019-07-10: qty 24

## 2019-07-10 MED ORDER — SODIUM CHLORIDE 0.9% FLUSH
10.0000 mL | INTRAVENOUS | Status: DC | PRN
  Administered 2019-07-10: 10 mL
  Filled 2019-07-10: qty 10

## 2019-07-10 MED ORDER — DEXAMETHASONE SODIUM PHOSPHATE 10 MG/ML IJ SOLN
10.0000 mg | Freq: Once | INTRAMUSCULAR | Status: AC
  Administered 2019-07-10: 10 mg via INTRAVENOUS

## 2019-07-10 MED ORDER — ACETAMINOPHEN 325 MG PO TABS
ORAL_TABLET | ORAL | Status: AC
  Filled 2019-07-10: qty 2

## 2019-07-10 MED ORDER — DIPHENHYDRAMINE HCL 50 MG/ML IJ SOLN
25.0000 mg | Freq: Once | INTRAMUSCULAR | Status: AC
  Administered 2019-07-10: 25 mg via INTRAVENOUS

## 2019-07-10 MED ORDER — DIPHENHYDRAMINE HCL 50 MG/ML IJ SOLN
INTRAMUSCULAR | Status: AC
  Filled 2019-07-10: qty 1

## 2019-07-10 MED ORDER — SODIUM CHLORIDE 0.9% FLUSH
10.0000 mL | Freq: Once | INTRAVENOUS | Status: AC
  Administered 2019-07-10: 10 mL
  Filled 2019-07-10: qty 10

## 2019-07-10 MED ORDER — ACETAMINOPHEN 325 MG PO TABS
650.0000 mg | ORAL_TABLET | Freq: Once | ORAL | Status: AC
  Administered 2019-07-10: 650 mg via ORAL

## 2019-07-10 MED ORDER — FAMOTIDINE IN NACL 20-0.9 MG/50ML-% IV SOLN
INTRAVENOUS | Status: AC
  Filled 2019-07-10: qty 50

## 2019-07-10 MED ORDER — HEPARIN SOD (PORK) LOCK FLUSH 100 UNIT/ML IV SOLN
500.0000 [IU] | Freq: Once | INTRAVENOUS | Status: AC | PRN
  Administered 2019-07-10: 500 [IU]
  Filled 2019-07-10: qty 5

## 2019-07-10 MED ORDER — FAMOTIDINE IN NACL 20-0.9 MG/50ML-% IV SOLN
20.0000 mg | Freq: Once | INTRAVENOUS | Status: AC
  Administered 2019-07-10: 20 mg via INTRAVENOUS

## 2019-07-10 MED ORDER — TRASTUZUMAB-ANNS CHEMO 150 MG IV SOLR
150.0000 mg | Freq: Once | INTRAVENOUS | Status: AC
  Administered 2019-07-10: 150 mg via INTRAVENOUS
  Filled 2019-07-10: qty 7.14

## 2019-07-10 MED ORDER — DEXAMETHASONE SODIUM PHOSPHATE 10 MG/ML IJ SOLN
INTRAMUSCULAR | Status: AC
  Filled 2019-07-10: qty 1

## 2019-07-10 MED ORDER — SODIUM CHLORIDE 0.9 % IV SOLN
Freq: Once | INTRAVENOUS | Status: AC
  Administered 2019-07-10: 12:00:00 via INTRAVENOUS
  Filled 2019-07-10: qty 250

## 2019-07-10 NOTE — Assessment & Plan Note (Signed)
04/02/2019:Screening mammogram detected a mass in the right axilla. [place] confirmed a 0.9cm right breast mass. Biopsy showed IDC, grade 2, HER-2+ (3+), ER+ 100%, PR+ 5%, Ki67 15%.  Treatment plan: 1.Breast conserving surgery with sentinel lymph node biopsy 2.Adjuvant chemotherapy with Taxol Herceptin weekly x12 followed by Herceptin maintenance for 1 year started8/14/2020 3.Adjuvant radiation therapy 4.Followed by adjuvant antiestrogen therapy with anastrozole 1 mg daily x5 to 7 years ------------------------------------------------------------------------------------------------------------------------------------------- Current treatment: Cycle9Taxol Herceptin Echocardiogram 04/24/2019:: EF 60 to 65%  Chemo toxicities: Denies any nausea or vomiting. Mild fatigue. She is able to continue her full activities including exercises and running track 6 days a week. 1. Leukopenia: Monitoring very closely. Her ANC is 2.  2.  Chemotherapy-induced anemia: Monitoring closely hemoglobin today is 9.9 3.  Mild peripheral neuropathy: Patient noticed occasional tingling but it has not persisted so we will keep the dosage the same.  Return to clinic weekly for chemo and I will see her every other week for follow-up with me.

## 2019-07-10 NOTE — Patient Instructions (Signed)
[facility] Discharge Instructions for Patients Receiving Chemotherapy  Today you received the following chemotherapy agents Trastuzumab-anns Calla Kicks) & Paclitaxel (TAXOL).  To help prevent nausea and vomiting after your treatment, we encourage you to take your nausea medication as prescribed.  If you develop nausea and vomiting that is not controlled by your nausea medication, call the clinic.   BELOW ARE SYMPTOMS THAT SHOULD BE REPORTED IMMEDIATELY:  *FEVER GREATER THAN 100.5 F  *CHILLS WITH OR WITHOUT FEVER  NAUSEA AND VOMITING THAT IS NOT CONTROLLED WITH YOUR NAUSEA MEDICATION  *UNUSUAL SHORTNESS OF BREATH  *UNUSUAL BRUISING OR BLEEDING  TENDERNESS IN MOUTH AND THROAT WITH OR WITHOUT PRESENCE OF ULCERS  *URINARY PROBLEMS  *BOWEL PROBLEMS  UNUSUAL RASH Items with * indicate a potential emergency and should be followed up as soon as possible.  Feel free to call the clinic should you have any questions or concerns. The clinic phone number is [phone].  Please show the [facility] at check-in to the Emergency Department and triage nurse.  Coronavirus (COVID-19) Are you at risk?  Are you at risk for the Coronavirus (COVID-19)?  To be considered HIGH RISK for Coronavirus (COVID-19), you have to meet the following criteria:  . Traveled to [place], [place], [place], [place] or [place]; or in the [place] to [place], [place], [place], or [place]; and have fever, cough, and shortness of breath within the last 2 weeks of travel OR . Been in close contact with a person diagnosed with COVID-19 within the last 2 weeks and have fever, cough, and shortness of breath . IF YOU DO NOT MEET THESE CRITERIA, YOU ARE CONSIDERED LOW RISK FOR COVID-19.  What to do if you are HIGH RISK for COVID-19?  Marland Kitchen If you are having a medical emergency, call 911. . Seek medical care right away. Before you go to a doctor's office, urgent care or emergency department,  call ahead and tell them about your recent travel, contact with someone diagnosed with COVID-19, and your symptoms. You should receive instructions from your physician's office regarding next steps of care.  . When you arrive at healthcare provider, tell the healthcare staff immediately you have returned from visiting [place], [place], [place], [place] or [place]; or traveled in the [place] to [place], [place], [place], or [place]; in the last two weeks or you have been in close contact with a person diagnosed with COVID-19 in the last 2 weeks.   . Tell the health care staff about your symptoms: fever, cough and shortness of breath. . After you have been seen by a medical provider, you will be either: o Tested for (COVID-19) and discharged home on quarantine except to seek medical care if symptoms worsen, and asked to  - Stay home and avoid contact with others until you get your results (4-5 days)  - Avoid travel on public transportation if possible (such as bus, train, or airplane) or o Sent to the Emergency Department by EMS for evaluation, COVID-19 testing, and possible admission depending on your condition and test results.  What to do if you are LOW RISK for COVID-19?  Reduce your risk of any infection by using the same precautions used for avoiding the common cold or flu:  Marland Kitchen Wash your hands often with soap and warm water for at least 20 seconds.  If soap and water are not readily available, use an alcohol-based hand sanitizer with at least 60% alcohol.  . If  coughing or sneezing, cover your mouth and nose by coughing or sneezing into the elbow areas of your shirt or coat, into a tissue or into your sleeve (not your hands). . Avoid shaking hands with others and consider head nods or verbal greetings only. . Avoid touching your eyes, nose, or mouth with unwashed hands.  . Avoid close contact with people who are sick. . Avoid places or events with large numbers of people in one  location, like concerts or sporting events. . Carefully consider travel plans you have or are making. . If you are planning any travel outside or inside the [place], visit the CDC's Travelers' Health webpage for the latest health notices. . If you have some symptoms but not all symptoms, continue to monitor at home and seek medical attention if your symptoms worsen. . If you are having a medical emergency, call 911.   [place] / e-Visit: [email or web address]         [facility]: [facility]: [phone]                   [facility]: [phone]

## 2019-07-17 ENCOUNTER — Inpatient Hospital Stay: Payer: 59

## 2019-07-17 ENCOUNTER — Other Ambulatory Visit: Payer: Self-pay

## 2019-07-17 VITALS — BP 158/84 | HR 72 | Temp 98.0°F | Resp 17 | Wt 171.0 lb

## 2019-07-17 DIAGNOSIS — C50211 Malignant neoplasm of upper-inner quadrant of right female breast: Secondary | ICD-10-CM

## 2019-07-17 DIAGNOSIS — Z95828 Presence of other vascular implants and grafts: Secondary | ICD-10-CM

## 2019-07-17 DIAGNOSIS — Z170 Estrogen receptor positive status [ER+]: Secondary | ICD-10-CM

## 2019-07-17 DIAGNOSIS — C50411 Malignant neoplasm of upper-outer quadrant of right female breast: Secondary | ICD-10-CM | POA: Diagnosis not present

## 2019-07-17 LAB — CBC WITH DIFFERENTIAL (CANCER CENTER ONLY)
Abs Immature Granulocytes: 0.03 10*3/uL (ref 0.00–0.07)
Basophils Absolute: 0 10*3/uL (ref 0.0–0.1)
Basophils Relative: 1 %
Eosinophils Absolute: 0.2 10*3/uL (ref 0.0–0.5)
Eosinophils Relative: 3 %
HCT: 30.4 % — ABNORMAL LOW (ref 36.0–46.0)
Hemoglobin: 10.1 g/dL — ABNORMAL LOW (ref 12.0–15.0)
Immature Granulocytes: 1 %
Lymphocytes Relative: 22 %
Lymphs Abs: 1.3 10*3/uL (ref 0.7–4.0)
MCH: 31.7 pg (ref 26.0–34.0)
MCHC: 33.2 g/dL (ref 30.0–36.0)
MCV: 95.3 fL (ref 80.0–100.0)
Monocytes Absolute: 0.3 10*3/uL (ref 0.1–1.0)
Monocytes Relative: 5 %
Neutro Abs: 3.9 10*3/uL (ref 1.7–7.7)
Neutrophils Relative %: 68 %
Platelet Count: 312 10*3/uL (ref 150–400)
RBC: 3.19 MIL/uL — ABNORMAL LOW (ref 3.87–5.11)
RDW: 15.3 % (ref 11.5–15.5)
WBC Count: 5.7 10*3/uL (ref 4.0–10.5)
nRBC: 0 % (ref 0.0–0.2)

## 2019-07-17 LAB — CMP (CANCER CENTER ONLY)
ALT: 18 U/L (ref 0–44)
AST: 19 U/L (ref 15–41)
Albumin: 3.7 g/dL (ref 3.5–5.0)
Alkaline Phosphatase: 57 U/L (ref 38–126)
Anion gap: 9 (ref 5–15)
BUN: 20 mg/dL (ref 8–23)
CO2: 28 mmol/L (ref 22–32)
Calcium: 9.4 mg/dL (ref 8.9–10.3)
Chloride: 102 mmol/L (ref 98–111)
Creatinine: 0.88 mg/dL (ref 0.44–1.00)
GFR, Est AFR Am: >60 mL/min (ref >60)
GFR, Estimated: >60 mL/min (ref >60)
Glucose, Bld: 98 mg/dL (ref 70–99)
Potassium: 3.9 mmol/L (ref 3.5–5.1)
Sodium: 139 mmol/L (ref 135–145)
Total Bilirubin: 0.3 mg/dL (ref 0.3–1.2)
Total Protein: 6.8 g/dL (ref 6.5–8.1)

## 2019-07-17 MED ORDER — SODIUM CHLORIDE 0.9% FLUSH
10.0000 mL | INTRAVENOUS | Status: DC | PRN
  Administered 2019-07-17: 10 mL
  Filled 2019-07-17: qty 10

## 2019-07-17 MED ORDER — DEXAMETHASONE SODIUM PHOSPHATE 10 MG/ML IJ SOLN
INTRAMUSCULAR | Status: AC
  Filled 2019-07-17: qty 1

## 2019-07-17 MED ORDER — FAMOTIDINE IN NACL 20-0.9 MG/50ML-% IV SOLN
INTRAVENOUS | Status: AC
  Filled 2019-07-17: qty 50

## 2019-07-17 MED ORDER — FAMOTIDINE IN NACL 20-0.9 MG/50ML-% IV SOLN
20.0000 mg | Freq: Once | INTRAVENOUS | Status: AC
  Administered 2019-07-17: 20 mg via INTRAVENOUS

## 2019-07-17 MED ORDER — DIPHENHYDRAMINE HCL 50 MG/ML IJ SOLN
25.0000 mg | Freq: Once | INTRAMUSCULAR | Status: AC
  Administered 2019-07-17: 25 mg via INTRAVENOUS

## 2019-07-17 MED ORDER — ACETAMINOPHEN 325 MG PO TABS
ORAL_TABLET | ORAL | Status: AC
  Filled 2019-07-17: qty 2

## 2019-07-17 MED ORDER — HEPARIN SOD (PORK) LOCK FLUSH 100 UNIT/ML IV SOLN
500.0000 [IU] | Freq: Once | INTRAVENOUS | Status: AC | PRN
  Administered 2019-07-17: 500 [IU]
  Filled 2019-07-17: qty 5

## 2019-07-17 MED ORDER — ACETAMINOPHEN 325 MG PO TABS
650.0000 mg | ORAL_TABLET | Freq: Once | ORAL | Status: AC
  Administered 2019-07-17: 650 mg via ORAL

## 2019-07-17 MED ORDER — DEXAMETHASONE SODIUM PHOSPHATE 10 MG/ML IJ SOLN
10.0000 mg | Freq: Once | INTRAMUSCULAR | Status: AC
  Administered 2019-07-17: 10 mg via INTRAVENOUS

## 2019-07-17 MED ORDER — SODIUM CHLORIDE 0.9 % IV SOLN
80.0000 mg/m2 | Freq: Once | INTRAVENOUS | Status: AC
  Administered 2019-07-17: 144 mg via INTRAVENOUS
  Filled 2019-07-17: qty 24

## 2019-07-17 MED ORDER — SODIUM CHLORIDE 0.9 % IV SOLN
Freq: Once | INTRAVENOUS | Status: AC
  Administered 2019-07-17: 14:00:00 via INTRAVENOUS
  Filled 2019-07-17: qty 250

## 2019-07-17 MED ORDER — TRASTUZUMAB-ANNS CHEMO 150 MG IV SOLR
150.0000 mg | Freq: Once | INTRAVENOUS | Status: AC
  Administered 2019-07-17: 150 mg via INTRAVENOUS
  Filled 2019-07-17: qty 7.14

## 2019-07-17 MED ORDER — DIPHENHYDRAMINE HCL 50 MG/ML IJ SOLN
INTRAMUSCULAR | Status: AC
  Filled 2019-07-17: qty 1

## 2019-07-17 NOTE — Patient Instructions (Signed)

## 2019-07-17 NOTE — Patient Instructions (Signed)
[facility] Discharge Instructions for Patients Receiving Chemotherapy  Today you received the following chemotherapy agents: Taxol/trastuzumab  To help prevent nausea and vomiting after your treatment, we encourage you to take your nausea medication as directed.   If you develop nausea and vomiting that is not controlled by your nausea medication, call the clinic.   BELOW ARE SYMPTOMS THAT SHOULD BE REPORTED IMMEDIATELY:  *FEVER GREATER THAN 100.5 F  *CHILLS WITH OR WITHOUT FEVER  NAUSEA AND VOMITING THAT IS NOT CONTROLLED WITH YOUR NAUSEA MEDICATION  *UNUSUAL SHORTNESS OF BREATH  *UNUSUAL BRUISING OR BLEEDING  TENDERNESS IN MOUTH AND THROAT WITH OR WITHOUT PRESENCE OF ULCERS  *URINARY PROBLEMS  *BOWEL PROBLEMS  UNUSUAL RASH Items with * indicate a potential emergency and should be followed up as soon as possible.  Feel free to call the clinic should you have any questions or concerns. The clinic phone number is [phone].  Please show the [facility] at check-in to the Emergency Department and triage nurse.

## 2019-07-23 NOTE — Assessment & Plan Note (Signed)
04/02/2019:Screening mammogram detected a mass in the right axilla. [place] confirmed a 0.9cm right breast mass. Biopsy showed IDC, grade 2, HER-2+ (3+), ER+ 100%, PR+ 5%, Ki67 15%.  Treatment plan: 1.Breast conserving surgery with sentinel lymph node biopsy 2.Adjuvant chemotherapy with Taxol Herceptin weekly x12 followed by Herceptin maintenance for 1 year started8/14/2020 3.Adjuvant radiation therapy 4.Followed by adjuvant antiestrogen therapy with anastrozole 1 mg daily x5 to 7 years ------------------------------------------------------------------------------------------------------------------------------------------- Current treatment: Cycle10Taxol Herceptin Echocardiogram 04/24/2019:: EF 60 to 65%  Chemo toxicities: Denies any nausea or vomiting. Mild fatigue. She is able to continue her full activities including exercisesand running track 6 days a week. 1.Leukopenia: Monitoring very closely. Her ANC is 1.7 2.Chemotherapy-induced anemia: Monitoring closely hemoglobin today is 9.9 3.Mild peripheral neuropathy: Patient noticed occasional tingling but it has not persisted so we will keep the dosage the same.  Return to clinic weekly for chemo and I will see her every other week for follow-up with me.

## 2019-07-23 NOTE — Progress Notes (Signed)
Patient Care Team: Minette Brine, [place] as PCP - General (General Practice) Mauro Kaufmann, RN as Oncology Nurse Navigator Rockwell Germany, RN as Oncology Nurse Navigator Nicholas Lose, MD as Consulting Physician (Hematology and Oncology) Rolm Bookbinder, MD as Consulting Physician (General Surgery) Eppie Gibson, MD as Attending Physician (Radiation Oncology)  DIAGNOSIS:    ICD-10-CM   1. Malignant neoplasm of upper-outer quadrant of right breast in female, estrogen receptor positive (Emily Ross)  C50.411    Z17.0     SUMMARY OF ONCOLOGIC HISTORY: Oncology History  Malignant neoplasm of upper-outer quadrant of right breast in female, estrogen receptor positive (Emily Ross)  04/02/2019 Initial Diagnosis   Screening mammogram detected a mass in the right axilla. [place] confirmed a 0.9cm right breast mass. Biopsy showed IDC, grade 2, HER-2+ (3+), ER+ 100%, PR+ 5%, Ki67 15%.    04/08/2019 Cancer Staging   Staging form: Breast, AJCC 8th Edition - Clinical stage from 04/08/2019: Stage IA (cT1b, cN0, cM0, G2, ER+, PR+, HER2+) - Signed by Nicholas Lose, MD on 04/08/2019   04/14/2019 Genetic Testing   Patient had genetic testing done for her personal and family history of breast cancer.  Negative genetic testing on the [facility] Breast Cancer STAT panel. The STAT Breast cancer panel offered by [facility] includes sequencing and rearrangement analysis for the following 9 genes:  ATM, BRCA1, BRCA2, CDH1, CHEK2, PALB2, PTEN, STK11 and TP53.  The report date is 04/14/2019.   04/17/2019 Surgery   Right lumpectomy [name]): IDC, 2.0cm, grade 2, clear margins, 5 axillary lymph nodes negative for carcinoma   Carcinoma of upper-inner quadrant of right breast in female, estrogen receptor positive (Emily Ross)  04/08/2019 Cancer Staging   Staging form: Breast, AJCC 8th Edition - Clinical stage from 04/08/2019: Stage IA (cT1b, cN0, cM0, G2, ER+, PR+, HER2+) - Signed by Eppie Gibson, MD on 04/08/2019   05/15/2019 -  Chemotherapy   The  patient had PACLitaxel (TAXOL) 144 mg in sodium chloride 0.9 % 250 mL chemo infusion (</= 69m/m2), 80 mg/m2 = 144 mg, Intravenous,  Once, 3 of 3 cycles Administration: 144 mg (05/15/2019), 144 mg (05/22/2019), 144 mg (06/12/2019), 144 mg (05/29/2019), 144 mg (06/05/2019), 144 mg (06/19/2019), 144 mg (06/26/2019), 144 mg (07/03/2019), 144 mg (07/10/2019), 144 mg (07/17/2019) trastuzumab-anns (KANJINTI) 300 mg in sodium chloride 0.9 % 250 mL chemo infusion, 294 mg (100 % of original dose 4 mg/kg), Intravenous,  Once, 3 of 16 cycles Dose modification: 4 mg/kg (original dose 4 mg/kg, Cycle 1, Reason: Other (see comments), Comment: load), 6 mg/kg (original dose 2 mg/kg, Cycle 3, Reason: Other (see comments), Comment: q3wk dosing), 2 mg/kg (original dose 2 mg/kg, Cycle 3, Reason: Other (see comments)), 450 mg (original dose 2 mg/kg, Cycle 3, Reason: Other (see comments), Comment: Maintenance dosing), 450 mg (original dose 6 mg/kg, Cycle 4, Reason: Other (see comments), Comment: rounded; vial size) Administration: 300 mg (05/15/2019), 150 mg (05/22/2019), 150 mg (05/29/2019), 150 mg (06/05/2019), 150 mg (06/12/2019), 150 mg (06/19/2019), 150 mg (06/26/2019), 150 mg (07/03/2019), 150 mg (07/10/2019), 150 mg (07/17/2019)  for chemotherapy treatment.      CHIEF COMPLIANT: Cycle11Taxol Kanjinti  INTERVAL HISTORY: Emily LEHMKUHLis a 63y.o. with above-mentioned history of right breast cancerwho underwent a lumpectomyand is currently onadjuvant chemotherapy with Taxol andKanjinti. She is a participant in the [employer] clinical trial.She presents to the clinic todayforcycle11.  REVIEW OF SYSTEMS:   Constitutional: Denies fevers, chills or abnormal weight loss Eyes: Denies blurriness of vision Ears, nose, mouth, throat, and face:  Denies mucositis or sore throat Respiratory: Denies cough, dyspnea or wheezes Cardiovascular: Denies palpitation, chest discomfort Gastrointestinal: Denies nausea, heartburn or change in bowel  habits Skin: Denies abnormal skin rashes Lymphatics: Denies new lymphadenopathy or easy bruising Neurological: Denies numbness, tingling or new weaknesses Behavioral/Psych: Mood is stable, no new changes  Extremities: No lower extremity edema Breast: denies any pain or lumps or nodules in either breasts All other systems were reviewed with the patient and are negative.  I have reviewed the past medical history, past surgical history, social history and family history with the patient and they are unchanged from previous note.  ALLERGIES:  has No Known Allergies.  MEDICATIONS:  Current Outpatient Medications  Medication Sig Dispense Refill  . Ascorbic Acid (VITAMIN C PO) Take 1 tablet by mouth daily.    Marland Kitchen b complex vitamins tablet Take 1 tablet by mouth daily.    . Carboxymethylcellul-Glycerin (LUBRICATING EYE DROPS OP) Place 1 drop into both eyes daily as needed (dry eyes).    . Cholecalciferol (DIALYVITE VITAMIN D 5000) 125 MCG (5000 UT) capsule Take 5,000 Units by mouth daily.    . Coenzyme Q10 (CO Q-10 PO) Take 1 capsule by mouth daily.    . diphenhydrAMINE (BENADRYL) 25 MG tablet Take 25 mg by mouth daily as needed for allergies.    . hydrochlorothiazide (HYDRODIURIL) 25 MG tablet Take 1 tablet (25 mg total) by mouth daily. 90 tablet 1  . ibuprofen (ADVIL) 600 MG tablet Take 1 tablet (600 mg total) by mouth every 6 (six) hours as needed. 30 tablet 2  . lidocaine-prilocaine (EMLA) cream Apply to affected area once 30 g 3  . LORazepam (ATIVAN) 0.5 MG tablet Take 1 tablet (0.5 mg total) by mouth at bedtime as needed for sleep. 30 tablet 0  . MAGNESIUM GLUCONATE PO Take 2 capsules by mouth daily.    . metoprolol tartrate (LOPRESSOR) 50 MG tablet Take 1 tablet (50 mg total) by mouth 2 (two) times daily. 180 tablet 1  . ondansetron (ZOFRAN) 8 MG tablet Take 1 tablet (8 mg total) by mouth 2 (two) times daily as needed (Nausea or vomiting). 30 tablet 1  . prochlorperazine (COMPAZINE) 10 MG  tablet Take 1 tablet (10 mg total) by mouth every 6 (six) hours as needed (Nausea or vomiting). 30 tablet 1  . Red Yeast Rice Extract (RED YEAST RICE PO) Take 2 capsules by mouth daily.    . simvastatin (ZOCOR) 10 MG tablet Take 1 tablet (10 mg total) by mouth every evening. 90 tablet 1   No current facility-administered medications for this visit.     PHYSICAL EXAMINATION: ECOG PERFORMANCE STATUS: 1 - Symptomatic but completely ambulatory  Vitals:   07/24/19 1059  BP: (!) 161/84  Pulse: 72  Resp: 18  Temp: 98.5 F (36.9 C)  SpO2: 99%   Filed Weights   07/24/19 1059  Weight: 172 lb 8 oz (78.2 kg)    GENERAL: alert, no distress and comfortable SKIN: skin color, texture, turgor are normal, no rashes or significant lesions EYES: normal, Conjunctiva are pink and non-injected, sclera clear OROPHARYNX: no exudate, no erythema and lips, buccal mucosa, and tongue normal  NECK: supple, thyroid normal size, non-tender, without nodularity LYMPH: no palpable lymphadenopathy in the cervical, axillary or inguinal LUNGS: clear to auscultation and percussion with normal breathing effort HEART: regular rate & rhythm and no murmurs and no lower extremity edema ABDOMEN: abdomen soft, non-tender and normal bowel sounds MUSCULOSKELETAL: no cyanosis of digits and no  clubbing  NEURO: alert & oriented x 3 with fluent speech, no focal motor/sensory deficits EXTREMITIES: No lower extremity edema  LABORATORY DATA:  I have reviewed the data as listed CMP Latest Ref Rng & Units 07/17/2019 07/10/2019 07/03/2019  Glucose 70 - 99 mg/dL 98 105(H) 100(H)  BUN 8 - 23 mg/dL 20 22 25(H)  Creatinine 0.44 - 1.00 mg/dL 0.88 0.90 0.79  Sodium 135 - 145 mmol/L 139 139 138  Potassium 3.5 - 5.1 mmol/L 3.9 3.6 3.7  Chloride 98 - 111 mmol/L 102 103 102  CO2 22 - 32 mmol/L 28 29 29   Calcium 8.9 - 10.3 mg/dL 9.4 9.5 9.6  Total Protein 6.5 - 8.1 g/dL 6.8 6.9 6.9  Total Bilirubin 0.3 - 1.2 mg/dL 0.3 0.3 0.3  Alkaline  Phos 38 - 126 U/L 57 60 63  AST 15 - 41 U/L 19 18 19   ALT 0 - 44 U/L 18 17 18     Lab Results  Component Value Date   WBC 3.5 (L) 07/24/2019   HGB 10.1 (L) 07/24/2019   HCT 30.5 (L) 07/24/2019   MCV 97.1 07/24/2019   PLT 297 07/24/2019   NEUTROABS 1.9 07/24/2019    ASSESSMENT & PLAN:  Malignant neoplasm of upper-outer quadrant of right breast in female, estrogen receptor positive (Emily Ross) 04/02/2019:Screening mammogram detected a mass in the right axilla. [place] confirmed a 0.9cm right breast mass. Biopsy showed IDC, grade 2, HER-2+ (3+), ER+ 100%, PR+ 5%, Ki67 15%.  Treatment plan: 1.Breast conserving surgery with sentinel lymph node biopsy 2.Adjuvant chemotherapy with Taxol Herceptin weekly x12 followed by Herceptin maintenance for 1 year started8/14/2020 3.Adjuvant radiation therapy 4.Followed by adjuvant antiestrogen therapy with anastrozole 1 mg daily x5 to 7 years ------------------------------------------------------------------------------------------------------------------------------------------- Current treatment: Cycle11Taxol Herceptin Echocardiogram 04/24/2019:: EF 60 to 65%  Chemo toxicities: Denies any nausea or vomiting. Mild fatigue. She is able to continue her full activities including exercisesand running track 6 days a week. 1.Leukopenia: Monitoring very closely. Her ANC is 1.9 2.Chemotherapy-induced anemia: Monitoring closely hemoglobin today is 10.1 3.Mild peripheral neuropathy: Patient noticed occasional tingling but it has not persisted so we will keep the dosage the same.  I sent a message to Dr. Isidore Moos to see her after the last Taxol for radiation therapy Return to clinic weekly for chemo and I will see her every other week for follow-up with me. She has appointments for every 3-week Herceptin.   No orders of the defined types were placed in this encounter.  The patient has a good understanding of the overall plan. she agrees with  it. she will call with any problems that may develop before the next visit here.  Nicholas Lose, MD 07/24/2019  Julious Oka Dorshimer am acting as scribe for Dr. Nicholas Lose.  I have reviewed the above documentation for accuracy and completeness, and I agree with the above.

## 2019-07-24 ENCOUNTER — Inpatient Hospital Stay: Payer: 59

## 2019-07-24 ENCOUNTER — Inpatient Hospital Stay (HOSPITAL_BASED_OUTPATIENT_CLINIC_OR_DEPARTMENT_OTHER): Payer: 59 | Admitting: Hematology and Oncology

## 2019-07-24 ENCOUNTER — Other Ambulatory Visit: Payer: Self-pay

## 2019-07-24 ENCOUNTER — Other Ambulatory Visit: Payer: Self-pay | Admitting: *Deleted

## 2019-07-24 VITALS — BP 124/61 | HR 69 | Temp 98.0°F | Resp 17

## 2019-07-24 DIAGNOSIS — C50411 Malignant neoplasm of upper-outer quadrant of right female breast: Secondary | ICD-10-CM | POA: Diagnosis not present

## 2019-07-24 DIAGNOSIS — Z170 Estrogen receptor positive status [ER+]: Secondary | ICD-10-CM

## 2019-07-24 DIAGNOSIS — C50211 Malignant neoplasm of upper-inner quadrant of right female breast: Secondary | ICD-10-CM

## 2019-07-24 DIAGNOSIS — Z95828 Presence of other vascular implants and grafts: Secondary | ICD-10-CM

## 2019-07-24 LAB — CBC WITH DIFFERENTIAL (CANCER CENTER ONLY)
Abs Immature Granulocytes: 0.02 10*3/uL (ref 0.00–0.07)
Basophils Absolute: 0 10*3/uL (ref 0.0–0.1)
Basophils Relative: 1 %
Eosinophils Absolute: 0.2 10*3/uL (ref 0.0–0.5)
Eosinophils Relative: 5 %
HCT: 30.5 % — ABNORMAL LOW (ref 36.0–46.0)
Hemoglobin: 10.1 g/dL — ABNORMAL LOW (ref 12.0–15.0)
Immature Granulocytes: 1 %
Lymphocytes Relative: 32 %
Lymphs Abs: 1.1 10*3/uL (ref 0.7–4.0)
MCH: 32.2 pg (ref 26.0–34.0)
MCHC: 33.1 g/dL (ref 30.0–36.0)
MCV: 97.1 fL (ref 80.0–100.0)
Monocytes Absolute: 0.2 10*3/uL (ref 0.1–1.0)
Monocytes Relative: 6 %
Neutro Abs: 1.9 10*3/uL (ref 1.7–7.7)
Neutrophils Relative %: 55 %
Platelet Count: 297 10*3/uL (ref 150–400)
RBC: 3.14 MIL/uL — ABNORMAL LOW (ref 3.87–5.11)
RDW: 15.5 % (ref 11.5–15.5)
WBC Count: 3.5 10*3/uL — ABNORMAL LOW (ref 4.0–10.5)
nRBC: 0 % (ref 0.0–0.2)

## 2019-07-24 LAB — CMP (CANCER CENTER ONLY)
ALT: 15 U/L (ref 0–44)
AST: 18 U/L (ref 15–41)
Albumin: 3.6 g/dL (ref 3.5–5.0)
Alkaline Phosphatase: 55 U/L (ref 38–126)
Anion gap: 8 (ref 5–15)
BUN: 20 mg/dL (ref 8–23)
CO2: 29 mmol/L (ref 22–32)
Calcium: 9.5 mg/dL (ref 8.9–10.3)
Chloride: 102 mmol/L (ref 98–111)
Creatinine: 0.77 mg/dL (ref 0.44–1.00)
GFR, Est AFR Am: >60 mL/min (ref >60)
GFR, Estimated: >60 mL/min (ref >60)
Glucose, Bld: 95 mg/dL (ref 70–99)
Potassium: 3.8 mmol/L (ref 3.5–5.1)
Sodium: 139 mmol/L (ref 135–145)
Total Bilirubin: 0.3 mg/dL (ref 0.3–1.2)
Total Protein: 6.7 g/dL (ref 6.5–8.1)

## 2019-07-24 MED ORDER — SODIUM CHLORIDE 0.9% FLUSH
10.0000 mL | INTRAVENOUS | Status: DC | PRN
  Administered 2019-07-24: 10 mL
  Filled 2019-07-24: qty 10

## 2019-07-24 MED ORDER — DEXAMETHASONE SODIUM PHOSPHATE 10 MG/ML IJ SOLN
10.0000 mg | Freq: Once | INTRAMUSCULAR | Status: AC
  Administered 2019-07-24: 10 mg via INTRAVENOUS

## 2019-07-24 MED ORDER — SODIUM CHLORIDE 0.9 % IV SOLN
Freq: Once | INTRAVENOUS | Status: AC
  Administered 2019-07-24: 12:00:00 via INTRAVENOUS
  Filled 2019-07-24: qty 250

## 2019-07-24 MED ORDER — ACETAMINOPHEN 325 MG PO TABS
650.0000 mg | ORAL_TABLET | Freq: Once | ORAL | Status: AC
  Administered 2019-07-24: 650 mg via ORAL

## 2019-07-24 MED ORDER — DIPHENHYDRAMINE HCL 50 MG/ML IJ SOLN
25.0000 mg | Freq: Once | INTRAMUSCULAR | Status: AC
  Administered 2019-07-24: 25 mg via INTRAVENOUS

## 2019-07-24 MED ORDER — SODIUM CHLORIDE 0.9 % IV SOLN
80.0000 mg/m2 | Freq: Once | INTRAVENOUS | Status: AC
  Administered 2019-07-24: 144 mg via INTRAVENOUS
  Filled 2019-07-24: qty 24

## 2019-07-24 MED ORDER — DIPHENHYDRAMINE HCL 50 MG/ML IJ SOLN
INTRAMUSCULAR | Status: AC
  Filled 2019-07-24: qty 1

## 2019-07-24 MED ORDER — ACETAMINOPHEN 325 MG PO TABS
ORAL_TABLET | ORAL | Status: AC
  Filled 2019-07-24: qty 2

## 2019-07-24 MED ORDER — TRASTUZUMAB-ANNS CHEMO 150 MG IV SOLR
150.0000 mg | Freq: Once | INTRAVENOUS | Status: AC
  Administered 2019-07-24: 150 mg via INTRAVENOUS
  Filled 2019-07-24: qty 7.14

## 2019-07-24 MED ORDER — FAMOTIDINE IN NACL 20-0.9 MG/50ML-% IV SOLN
INTRAVENOUS | Status: AC
  Filled 2019-07-24: qty 50

## 2019-07-24 MED ORDER — LUBRICATING EYE DROPS 0.5-0.9 % OP SOLN: 1.0000 [drp] | OPHTHALMIC | 1 refills | Status: DC | PRN

## 2019-07-24 MED ORDER — HEPARIN SOD (PORK) LOCK FLUSH 100 UNIT/ML IV SOLN
500.0000 [IU] | Freq: Once | INTRAVENOUS | Status: DC | PRN
  Filled 2019-07-24: qty 5

## 2019-07-24 MED ORDER — SODIUM CHLORIDE 0.9% FLUSH
10.0000 mL | INTRAVENOUS | Status: DC | PRN
  Filled 2019-07-24: qty 10

## 2019-07-24 MED ORDER — DEXAMETHASONE 0.1 % OP SUSP: 1.0000 [drp] | Freq: Two times a day (BID) | OPHTHALMIC | 0 refills | Status: DC

## 2019-07-24 MED ORDER — DEXAMETHASONE SODIUM PHOSPHATE 10 MG/ML IJ SOLN
INTRAMUSCULAR | Status: AC
  Filled 2019-07-24: qty 1

## 2019-07-24 MED ORDER — FAMOTIDINE IN NACL 20-0.9 MG/50ML-% IV SOLN
20.0000 mg | Freq: Once | INTRAVENOUS | Status: AC
  Administered 2019-07-24: 20 mg via INTRAVENOUS

## 2019-07-24 NOTE — Progress Notes (Signed)
Rx for eye drops not at [facility], [place] or [facility], rx resent to [facility] on [address] per pt request.

## 2019-07-27 ENCOUNTER — Telehealth: Payer: Self-pay | Admitting: Radiation Oncology

## 2019-07-27 NOTE — Telephone Encounter (Signed)
New message:    LVM for patient to return call to get scheduled from referral received

## 2019-07-31 ENCOUNTER — Inpatient Hospital Stay: Payer: 59

## 2019-07-31 ENCOUNTER — Encounter: Payer: Self-pay | Admitting: *Deleted

## 2019-07-31 ENCOUNTER — Other Ambulatory Visit: Payer: Self-pay

## 2019-07-31 VITALS — BP 151/88 | HR 84 | Temp 98.7°F | Resp 18

## 2019-07-31 DIAGNOSIS — C50211 Malignant neoplasm of upper-inner quadrant of right female breast: Secondary | ICD-10-CM

## 2019-07-31 DIAGNOSIS — Z95828 Presence of other vascular implants and grafts: Secondary | ICD-10-CM

## 2019-07-31 DIAGNOSIS — C50411 Malignant neoplasm of upper-outer quadrant of right female breast: Secondary | ICD-10-CM | POA: Diagnosis not present

## 2019-07-31 DIAGNOSIS — Z170 Estrogen receptor positive status [ER+]: Secondary | ICD-10-CM

## 2019-07-31 LAB — CBC WITH DIFFERENTIAL (CANCER CENTER ONLY)
Abs Immature Granulocytes: 0.04 10*3/uL (ref 0.00–0.07)
Basophils Absolute: 0 10*3/uL (ref 0.0–0.1)
Basophils Relative: 1 %
Eosinophils Absolute: 0.2 10*3/uL (ref 0.0–0.5)
Eosinophils Relative: 5 %
HCT: 31.4 % — ABNORMAL LOW (ref 36.0–46.0)
Hemoglobin: 10.5 g/dL — ABNORMAL LOW (ref 12.0–15.0)
Immature Granulocytes: 1 %
Lymphocytes Relative: 26 %
Lymphs Abs: 1.1 10*3/uL (ref 0.7–4.0)
MCH: 31.8 pg (ref 26.0–34.0)
MCHC: 33.4 g/dL (ref 30.0–36.0)
MCV: 95.2 fL (ref 80.0–100.0)
Monocytes Absolute: 0.3 10*3/uL (ref 0.1–1.0)
Monocytes Relative: 6 %
Neutro Abs: 2.6 10*3/uL (ref 1.7–7.7)
Neutrophils Relative %: 61 %
Platelet Count: 314 10*3/uL (ref 150–400)
RBC: 3.3 MIL/uL — ABNORMAL LOW (ref 3.87–5.11)
RDW: 15 % (ref 11.5–15.5)
WBC Count: 4.2 10*3/uL (ref 4.0–10.5)
nRBC: 0 % (ref 0.0–0.2)

## 2019-07-31 LAB — CMP (CANCER CENTER ONLY)
ALT: 17 U/L (ref 0–44)
AST: 17 U/L (ref 15–41)
Albumin: 3.8 g/dL (ref 3.5–5.0)
Alkaline Phosphatase: 59 U/L (ref 38–126)
Anion gap: 10 (ref 5–15)
BUN: 23 mg/dL (ref 8–23)
CO2: 27 mmol/L (ref 22–32)
Calcium: 10.5 mg/dL — ABNORMAL HIGH (ref 8.9–10.3)
Chloride: 103 mmol/L (ref 98–111)
Creatinine: 0.76 mg/dL (ref 0.44–1.00)
GFR, Est AFR Am: >60 mL/min (ref >60)
GFR, Estimated: >60 mL/min (ref >60)
Glucose, Bld: 102 mg/dL — ABNORMAL HIGH (ref 70–99)
Potassium: 3.7 mmol/L (ref 3.5–5.1)
Sodium: 140 mmol/L (ref 135–145)
Total Bilirubin: 0.3 mg/dL (ref 0.3–1.2)
Total Protein: 7.1 g/dL (ref 6.5–8.1)

## 2019-07-31 MED ORDER — DIPHENHYDRAMINE HCL 50 MG/ML IJ SOLN
INTRAMUSCULAR | Status: AC
  Filled 2019-07-31: qty 1

## 2019-07-31 MED ORDER — TRASTUZUMAB-ANNS CHEMO 150 MG IV SOLR
450.0000 mg | Freq: Once | INTRAVENOUS | Status: AC
  Administered 2019-07-31: 450 mg via INTRAVENOUS
  Filled 2019-07-31: qty 21.43

## 2019-07-31 MED ORDER — ACETAMINOPHEN 325 MG PO TABS
650.0000 mg | ORAL_TABLET | Freq: Once | ORAL | Status: AC
  Administered 2019-07-31: 650 mg via ORAL

## 2019-07-31 MED ORDER — FAMOTIDINE IN NACL 20-0.9 MG/50ML-% IV SOLN
INTRAVENOUS | Status: AC
  Filled 2019-07-31: qty 50

## 2019-07-31 MED ORDER — ACETAMINOPHEN 325 MG PO TABS
ORAL_TABLET | ORAL | Status: AC
  Filled 2019-07-31: qty 2

## 2019-07-31 MED ORDER — HEPARIN SOD (PORK) LOCK FLUSH 100 UNIT/ML IV SOLN
500.0000 [IU] | Freq: Once | INTRAVENOUS | Status: AC | PRN
  Administered 2019-07-31: 500 [IU]
  Filled 2019-07-31: qty 5

## 2019-07-31 MED ORDER — SODIUM CHLORIDE 0.9% FLUSH
10.0000 mL | INTRAVENOUS | Status: DC | PRN
  Administered 2019-07-31: 10 mL
  Filled 2019-07-31: qty 10

## 2019-07-31 MED ORDER — SODIUM CHLORIDE 0.9 % IV SOLN
80.0000 mg/m2 | Freq: Once | INTRAVENOUS | Status: AC
  Administered 2019-07-31: 144 mg via INTRAVENOUS
  Filled 2019-07-31: qty 24

## 2019-07-31 MED ORDER — DIPHENHYDRAMINE HCL 50 MG/ML IJ SOLN
25.0000 mg | Freq: Once | INTRAMUSCULAR | Status: AC
  Administered 2019-07-31: 25 mg via INTRAVENOUS

## 2019-07-31 MED ORDER — DEXAMETHASONE SODIUM PHOSPHATE 10 MG/ML IJ SOLN
INTRAMUSCULAR | Status: AC
  Filled 2019-07-31: qty 1

## 2019-07-31 MED ORDER — SODIUM CHLORIDE 0.9 % IV SOLN
Freq: Once | INTRAVENOUS | Status: AC
  Administered 2019-07-31: 14:00:00 via INTRAVENOUS
  Filled 2019-07-31: qty 250

## 2019-07-31 MED ORDER — FAMOTIDINE IN NACL 20-0.9 MG/50ML-% IV SOLN
20.0000 mg | Freq: Once | INTRAVENOUS | Status: AC
  Administered 2019-07-31: 20 mg via INTRAVENOUS

## 2019-07-31 MED ORDER — DEXAMETHASONE SODIUM PHOSPHATE 10 MG/ML IJ SOLN
10.0000 mg | Freq: Once | INTRAMUSCULAR | Status: AC
  Administered 2019-07-31: 10 mg via INTRAVENOUS

## 2019-07-31 NOTE — Patient Instructions (Signed)
[facility] Discharge Instructions for Patients Receiving Chemotherapy  Today you received the following chemotherapy agents: Taxol/trastuzumab  To help prevent nausea and vomiting after your treatment, we encourage you to take your nausea medication as directed.   If you develop nausea and vomiting that is not controlled by your nausea medication, call the clinic.   BELOW ARE SYMPTOMS THAT SHOULD BE REPORTED IMMEDIATELY:  *FEVER GREATER THAN 100.5 F  *CHILLS WITH OR WITHOUT FEVER  NAUSEA AND VOMITING THAT IS NOT CONTROLLED WITH YOUR NAUSEA MEDICATION  *UNUSUAL SHORTNESS OF BREATH  *UNUSUAL BRUISING OR BLEEDING  TENDERNESS IN MOUTH AND THROAT WITH OR WITHOUT PRESENCE OF ULCERS  *URINARY PROBLEMS  *BOWEL PROBLEMS  UNUSUAL RASH Items with * indicate a potential emergency and should be followed up as soon as possible.  Feel free to call the clinic should you have any questions or concerns. The clinic phone number is [phone].  Please show the [facility] at check-in to the Emergency Department and triage nurse.

## 2019-08-03 ENCOUNTER — Other Ambulatory Visit: Payer: Self-pay | Admitting: Obstetrics and Gynecology

## 2019-08-03 ENCOUNTER — Other Ambulatory Visit (HOSPITAL_COMMUNITY)
Admission: RE | Admit: 2019-08-03 | Discharge: 2019-08-03 | Disposition: A | Discharge status: Home / Self Care | Payer: 59 | Source: Ambulatory Visit | Attending: Obstetrics and Gynecology | Admitting: Obstetrics and Gynecology

## 2019-08-03 DIAGNOSIS — Z01419 Encounter for gynecological examination (general) (routine) without abnormal findings: Secondary | ICD-10-CM | POA: Insufficient documentation

## 2019-08-04 NOTE — Progress Notes (Signed)
Location of Breast Cancer: Right Breast  Histology per Pathology Report:  03/30/19 Diagnosis Breast, right, needle core biopsy, 0.9 cm mass, 1 o'clock, 15 cm fn - INVASIVE DUCTAL CARCINOMA, GRADE 2.  Receptor Status: ER(100%), PR (5%), Her2-neu (POS), Ki-(15%)  04/17/19 Diagnosis 1. Breast, lumpectomy, right with radioactive seed - INVASIVE DUCTAL CARCINOMA, [place] GRADE 2 OF 3, 2.0 CM - MARGINS UNINVOLVED BY CARCINOMA (0.2 CM; POSTERIOR MARGIN) - PREVIOUS BIOPSY SITE CHANGES PRESENT - SEE ONCOLOGY TABLE BELOW 2. Lymph node, sentinel, biopsy, right axillary - NO CARCINOMA IDENTIFIED IN ONE LYMPH NODE (0/1) 3. Lymph node, sentinel, biopsy, right - NO CARCINOMA IDENTIFIED IN ONE LYMPH NODE (0/1) 4. Lymph node, sentinel, biopsy, right - NO CARCINOMA IDENTIFIED IN ONE LYMPH NODE (0/1) 5. Lymph node, sentinel, biopsy, right - NO CARCINOMA IDENTIFIED IN ONE LYMPH NODE (0/1) 6. Lymph node, sentinel, biopsy, right - NO CARCINOMA IDENTIFIED IN ONE LYMPH NODE (0/1) 7. Breast, excision, right additional anterior margin - BENIGN BREAST TISSUE - NO RESIDUAL CARCINOMA IDENTIFIED  Did patient present with symptoms or was this found on screening mammography?: It was found on a screening mammogram.   Past/Anticipated interventions by surgeon, if any: 04/17/19 Procedure: 1.Rightbreast radioactive seed guided lumpectomy 2. Right axillary sentinel lymph node biopsy 3.  Right IJ port placement with [place] Surgeon: Dr. Serita Grammes  Past/Anticipated interventions by medical oncology, if any:  07/24/19 Dr. Lindi Adie ASSESSMENT & PLAN:  Malignant neoplasm of upper-outer quadrant of right breast in female, estrogen receptor positive ([place]) 04/02/2019:Screening mammogram detected a mass in the right axilla. [place] confirmed a 0.9cm right breast mass. Biopsy showed IDC, grade 2, HER-2+ (3+), ER+ 100%, PR+ 5%, Ki67 15%.  Treatment plan: 1.Breast conserving surgery with sentinel lymph node  biopsy 2.Adjuvant chemotherapy with Taxol Herceptin weekly x12 followed by Herceptin maintenance for 1 year started8/14/2020 3.Adjuvant radiation therapy 4.Followed by adjuvant antiestrogen therapy with anastrozole 1 mg daily x5 to 7 years ------------------------------------------------------------------------------------------------------------------------------------------- Current treatment: Cycle11Taxol Herceptin Echocardiogram 04/24/2019:: EF 60 to 65%  I sent a message to Dr. Isidore Moos to see her after the last Taxol for radiation therapy Return to clinic weekly for chemo and I will see her every other week for follow-up with me. She has appointments for every 3-week Herceptin.   Lymphedema issues, if any: She denies.   Pain issues, if any:  She denies  SAFETY ISSUES:  Prior radiation? Yes, she reports radiation in 2009 to her left breast at [facility].   Pacemaker/ICD? No  Possible current pregnancy? No  Is the patient on methotrexate? No  Current Complaints / other details:      Emily Ross, Stephani Police, RN 08/04/2019,2:00 PM

## 2019-08-07 ENCOUNTER — Other Ambulatory Visit: Payer: 59

## 2019-08-07 ENCOUNTER — Ambulatory Visit: Payer: 59

## 2019-08-07 ENCOUNTER — Ambulatory Visit: Payer: 59 | Admitting: Hematology and Oncology

## 2019-08-10 NOTE — Progress Notes (Signed)
Radiation Oncology         [phone] ________________________________  Name: Emily Ross MRN: [id number]  Date: 08/11/2019  DOB: 1955/12/07  Follow-Up Visit Note  Outpatient, in person  CC: Minette Brine, FNP  Nicholas Lose, MD  Diagnosis:      ICD-10-CM   1. Carcinoma of upper-inner quadrant of right breast in female, estrogen receptor positive ([place])  C50.211    Z17.0      Cancer Staging Carcinoma of upper-inner quadrant of right breast in female, estrogen receptor positive ([place]) Staging form: Breast, AJCC 8th Edition - Clinical stage from 04/08/2019: Stage IA (cT1b, cN0, cM0, G2, ER+, PR+, HER2+) - Signed by Eppie Gibson, MD on 04/08/2019  Malignant neoplasm of upper-outer quadrant of right breast in female, estrogen receptor positive ([place]) Staging form: Breast, AJCC 8th Edition - Clinical stage from 04/08/2019: Stage IA (cT1b, cN0, cM0, G2, ER+, PR+, HER2+) - Signed by Nicholas Lose, MD on 04/08/2019   Right breast UIQ pT1c, pN0 cancer   CHIEF COMPLAINT: Here to discuss management of right breast cancer  Narrative:  The patient returns today for follow-up to discuss radiation treatment options. She was seen in the multidisciplinary breast clinic on 04/08/2019.     Since consultation date, she underwent genetic testing on 04/14/2019 revealing: no deleterious mutations.    She opted to proceed with right lumpectomy on date of 04/17/2019 with pathology report revealing: tumor size of 2 cm; histology of invasive ductal carcinoma; margin status to invasive disease of >2 mm; nodal status of negative (0/5); Grade 2.  She is being treated with systemic therapy by Dr. Lindi Adie: 12 cycles of taxol and Kanjinti from 05/15/2019 to 07/31/2019.  Symptomatically, the patient reports: doing well  Lymphedema issues, if any: She denies.   Pain issues, if any:  She denies  SAFETY ISSUES:  Prior radiation? Yes, she reports radiation in 2009 to her left breast at [facility].    Pacemaker/ICD? No  Possible current pregnancy? No  Is the patient on methotrexate? No          ALLERGIES:  has No Known Allergies.  Meds: Current Outpatient Medications  Medication Sig Dispense Refill  . Ascorbic Acid (VITAMIN C PO) Take 1 tablet by mouth daily.    Marland Kitchen b complex vitamins tablet Take 1 tablet by mouth daily.    . carboxymethylcellul-glycerin (LUBRICATING EYE DROPS) 0.5-0.9 % ophthalmic solution Place 1 drop into both eyes as needed (dry eyes). 30 mL 1  . Cholecalciferol (DIALYVITE VITAMIN D 5000) 125 MCG (5000 UT) capsule Take 5,000 Units by mouth daily.    . Coenzyme Q10 (CO Q-10 PO) Take 1 capsule by mouth daily.    Marland Kitchen dexamethasone (DECADRON) 0.1 % ophthalmic suspension Place 1 drop into both eyes 2 (two) times daily. 15 mL 0  . diphenhydrAMINE (BENADRYL) 25 MG tablet Take 25 mg by mouth daily as needed for allergies.    . hydrochlorothiazide (HYDRODIURIL) 25 MG tablet Take 1 tablet (25 mg total) by mouth daily. 90 tablet 1  . ibuprofen (ADVIL) 600 MG tablet Take 1 tablet (600 mg total) by mouth every 6 (six) hours as needed. 30 tablet 2  . lidocaine-prilocaine (EMLA) cream Apply to affected area once 30 g 3  . LORazepam (ATIVAN) 0.5 MG tablet Take 1 tablet (0.5 mg total) by mouth at bedtime as needed for sleep. 30 tablet 0  . MAGNESIUM GLUCONATE PO Take 2 capsules by mouth daily.    . metoprolol tartrate (LOPRESSOR) 50 MG  tablet Take 1 tablet (50 mg total) by mouth 2 (two) times daily. 180 tablet 1  . ondansetron (ZOFRAN) 8 MG tablet Take 1 tablet (8 mg total) by mouth 2 (two) times daily as needed (Nausea or vomiting). 30 tablet 1  . prochlorperazine (COMPAZINE) 10 MG tablet Take 1 tablet (10 mg total) by mouth every 6 (six) hours as needed (Nausea or vomiting). 30 tablet 1  . Red Yeast Rice Extract (RED YEAST RICE PO) Take 2 capsules by mouth daily.    . simvastatin (ZOCOR) 10 MG tablet Take 1 tablet (10 mg total) by mouth every evening. 90 tablet 1   No current  facility-administered medications for this encounter.     Physical Findings:  weight is 173 lb 12.8 oz (78.8 kg). Her tympanic temperature is 98.5 F (36.9 C). Her blood pressure is 173/92 (abnormal) and her pulse is 80. Her respiration is 20 and oxygen saturation is 97%. .     General: Alert and oriented, in no acute distress  Breast (right) has healed well  Lab Findings: Lab Results  Component Value Date   WBC 4.2 07/31/2019   HGB 10.5 (L) 07/31/2019   HCT 31.4 (L) 07/31/2019   MCV 95.2 07/31/2019   PLT 314 07/31/2019      Radiographic Findings: No results found.  Impression/Plan: Right breast cancer  We discussed adjuvant radiotherapy today.  I recommend 4 weeks directed at the right breast in order to reduce risk of local regional recurrence by two thirds.  The risks, benefits and side effects of this treatment were discussed in detail.  She understands that radiotherapy is associated with skin irritation and fatigue in the acute setting. Late effects can include cosmetic changes and rare injury to internal organs.  She is enthusiastic about proceeding with treatment. A consent form has been signed and placed in her chart.  Proceed with simulation this morning.  We will hold radiotherapy until she has at least 3 weeks to heal from chemotherapy.   I spent 5 minutes face to face with the patient and more than 50% of that time was spent in counseling and/or coordination of care. _____________________________________   Eppie Gibson, MD   This document serves as a record of services personally performed by Eppie Gibson, MD. It was created on her behalf by Wilburn Mylar, a trained medical scribe. The creation of this record is based on the scribe's personal observations and the provider's statements to them. This document has been checked and approved by the attending provider.

## 2019-08-11 ENCOUNTER — Ambulatory Visit
Admission: RE | Admit: 2019-08-11 | Discharge: 2019-08-11 | Disposition: A | Discharge status: Home / Self Care | Payer: 59 | Source: Ambulatory Visit | Attending: Radiation Oncology | Admitting: Radiation Oncology

## 2019-08-11 ENCOUNTER — Other Ambulatory Visit: Payer: Self-pay

## 2019-08-11 ENCOUNTER — Ambulatory Visit: Payer: 59 | Admitting: Radiation Oncology

## 2019-08-11 ENCOUNTER — Encounter: Payer: Self-pay | Admitting: Radiation Oncology

## 2019-08-11 VITALS — BP 173/92 | HR 80 | Temp 98.5°F | Resp 20 | Wt 173.8 lb

## 2019-08-11 DIAGNOSIS — Z510 Encounter for antineoplastic radiation therapy: Secondary | ICD-10-CM | POA: Diagnosis present

## 2019-08-11 DIAGNOSIS — C50211 Malignant neoplasm of upper-inner quadrant of right female breast: Secondary | ICD-10-CM

## 2019-08-11 DIAGNOSIS — Z170 Estrogen receptor positive status [ER+]: Secondary | ICD-10-CM

## 2019-08-11 LAB — CYTOLOGY - PAP
Comment: NEGATIVE
Comment: NEGATIVE
Diagnosis: NEGATIVE
HPV 16: NEGATIVE
HPV 18 / 45: NEGATIVE
High risk HPV: POSITIVE — AB

## 2019-08-11 NOTE — Progress Notes (Signed)
  Radiation Oncology         [phone] ________________________________  Name: Emily Ross MRN: [id number]  Date: 08/11/2019  DOB: [birth year]  SIMULATION AND TREATMENT PLANNING NOTE // Special Treatment Procedure Note  Outpatient  DIAGNOSIS:     ICD-10-CM   1. Carcinoma of upper-inner quadrant of right breast in female, estrogen receptor positive ([place])  C50.211    Z17.0    NARRATIVE:  The patient was brought to the [facility].  Identity was confirmed.  All relevant records and images related to the planned course of therapy were reviewed.  The patient freely provided informed written consent to proceed with treatment after reviewing the details related to the planned course of therapy. The consent form was witnessed and verified by the simulation staff.    Then, the patient was set-up in a stable reproducible supine position for radiation therapy with her ipsilateral arm over her head, and her upper body secured in a custom-made Vac-lok device.  CT images were obtained.  Surface markings were placed.  The CT images were loaded into the planning software.    TREATMENT PLANNING NOTE: Treatment planning then occurred.  The radiation prescription was entered and confirmed.     A total of 3 medically necessary complex treatment devices were fabricated and supervised by me: 2 fields with MLCs for custom blocks to protect heart, and lungs;  and, a Vac-lok. MORE COMPLEX DEVICES MAY BE MADE IN DOSIMETRY FOR FIELD IN FIELD BEAMS FOR DOSE HOMOGENEITY.  I have requested : 3D Simulation which is medically necessary to give adequate dose to at risk tissues while sparing lungs and heart.  I have requested a DVH of the following structures: lungs, heart, right lumpectomy cavity.    The patient will receive 40.05 Gy in 15 fractions to the right breast with 2 tangential fields.   This will be followed by a boost.  Optical Surface Tracking Plan:  Since intensity modulated radiotherapy  (IMRT) and 3D conformal radiation treatment methods are predicated on accurate and precise positioning for treatment, intrafraction motion monitoring is medically necessary to ensure accurate and safe treatment delivery. The ability to quantify intrafraction motion without excessive ionizing radiation dose can only be performed with optical surface tracking. Accordingly, surface imaging offers the opportunity to obtain 3D measurements of patient position throughout IMRT and 3D treatments without excessive radiation exposure. I am ordering optical surface tracking for this patient's upcoming course of radiotherapy.  ________________________________   Reference:  Ursula Alert, J, et al. Surface imaging-based analysis of intrafraction motion for breast radiotherapy patients.Journal of [place], n. 6, nov. 2014. ISSN [id number].  Available at: <http://www.jacmp.org/index.php/jacmp/article/view/4957>.   Special Treatment Procedure Note: The patient received prior radiotherapy close to her current fields. There could be some overlap of radiation dose.  Prior regional radiotherapy increases the risk of side effects from treatment. I have considered this in the treatment planning process and have aimed to minimize tissue overlap.  This increases the complexity of this patient's treatment and therefore this constitutes a special treatment procedure.   -----------------------------------  Eppie Gibson, MD

## 2019-08-12 ENCOUNTER — Other Ambulatory Visit: Payer: Self-pay | Admitting: *Deleted

## 2019-08-12 DIAGNOSIS — C50211 Malignant neoplasm of upper-inner quadrant of right female breast: Secondary | ICD-10-CM

## 2019-08-12 DIAGNOSIS — Z510 Encounter for antineoplastic radiation therapy: Secondary | ICD-10-CM | POA: Diagnosis not present

## 2019-08-12 DIAGNOSIS — Z170 Estrogen receptor positive status [ER+]: Secondary | ICD-10-CM

## 2019-08-13 ENCOUNTER — Other Ambulatory Visit (HOSPITAL_COMMUNITY): Payer: Self-pay | Admitting: Hematology and Oncology

## 2019-08-13 ENCOUNTER — Encounter: Payer: Self-pay | Admitting: *Deleted

## 2019-08-13 DIAGNOSIS — Z006 Encounter for examination for normal comparison and control in clinical research program: Secondary | ICD-10-CM

## 2019-08-14 ENCOUNTER — Ambulatory Visit: Payer: 59 | Admitting: Radiation Oncology

## 2019-08-20 NOTE — Progress Notes (Signed)
Patient Care Team: Minette Brine, [place] as PCP - General (General Practice) Mauro Kaufmann, RN as Oncology Nurse Navigator Rockwell Germany, RN as Oncology Nurse Navigator Nicholas Lose, MD as Consulting Physician (Hematology and Oncology) Rolm Bookbinder, MD as Consulting Physician (General Surgery) Eppie Gibson, MD as Attending Physician (Radiation Oncology)  DIAGNOSIS:    ICD-10-CM   1. Carcinoma of upper-inner quadrant of right breast in female, estrogen receptor positive ([place])  C50.211    Z17.0     SUMMARY OF ONCOLOGIC HISTORY: Oncology History  Malignant neoplasm of upper-outer quadrant of right breast in female, estrogen receptor positive ([place])  04/02/2019 Initial Diagnosis   Screening mammogram detected a mass in the right axilla. [place] confirmed a 0.9cm right breast mass. Biopsy showed IDC, grade 2, HER-2+ (3+), ER+ 100%, PR+ 5%, Ki67 15%.    04/08/2019 Cancer Staging   Staging form: Breast, AJCC 8th Edition - Clinical stage from 04/08/2019: Stage IA (cT1b, cN0, cM0, G2, ER+, PR+, HER2+) - Signed by Nicholas Lose, MD on 04/08/2019   04/14/2019 Genetic Testing   Patient had genetic testing done for her personal and family history of breast cancer.  Negative genetic testing on the [facility] Breast Cancer STAT panel. The STAT Breast cancer panel offered by [facility] includes sequencing and rearrangement analysis for the following 9 genes:  ATM, BRCA1, BRCA2, CDH1, CHEK2, PALB2, PTEN, STK11 and TP53.  The report date is 04/14/2019.   04/17/2019 Surgery   Right lumpectomy [name]): IDC, 2.0cm, grade 2, clear margins, 5 axillary lymph nodes negative for carcinoma   Carcinoma of upper-inner quadrant of right breast in female, estrogen receptor positive ([place])  04/08/2019 Cancer Staging   Staging form: Breast, AJCC 8th Edition - Clinical stage from 04/08/2019: Stage IA (cT1b, cN0, cM0, G2, ER+, PR+, HER2+) - Signed by Eppie Gibson, MD on 04/08/2019   05/15/2019 -  Chemotherapy   The patient  had PACLitaxel (TAXOL) 144 mg in sodium chloride 0.9 % 250 mL chemo infusion (</= 28m/m2), 80 mg/m2 = 144 mg, Intravenous,  Once, 3 of 3 cycles Administration: 144 mg (05/15/2019), 144 mg (05/22/2019), 144 mg (06/12/2019), 144 mg (05/29/2019), 144 mg (06/05/2019), 144 mg (06/19/2019), 144 mg (06/26/2019), 144 mg (07/03/2019), 144 mg (07/10/2019), 144 mg (07/17/2019), 144 mg (07/24/2019), 144 mg (07/31/2019) trastuzumab-anns (KANJINTI) 300 mg in sodium chloride 0.9 % 250 mL chemo infusion, 294 mg (100 % of original dose 4 mg/kg), Intravenous,  Once, 3 of 16 cycles Dose modification: 4 mg/kg (original dose 4 mg/kg, Cycle 1, Reason: Other (see comments), Comment: load), 6 mg/kg (original dose 2 mg/kg, Cycle 3, Reason: Other (see comments), Comment: q3wk dosing), 2 mg/kg (original dose 2 mg/kg, Cycle 3, Reason: Other (see comments)), 450 mg (original dose 2 mg/kg, Cycle 3, Reason: Other (see comments), Comment: Maintenance dosing), 450 mg (original dose 6 mg/kg, Cycle 4, Reason: Other (see comments), Comment: rounded; vial size) Administration: 300 mg (05/15/2019), 150 mg (05/22/2019), 150 mg (05/29/2019), 150 mg (06/05/2019), 150 mg (06/12/2019), 150 mg (06/19/2019), 150 mg (06/26/2019), 150 mg (07/03/2019), 150 mg (07/10/2019), 150 mg (07/17/2019), 150 mg (07/24/2019), 450 mg (07/31/2019)  for chemotherapy treatment.      CHIEF COMPLIANT: Follow-up to discuss anti-estrogen therapy  INTERVAL HISTORY: Emily HEATHERLYis a 63y.o. with above-mentioned history of right breast cancerwho underwent a lumpectomyand completed adjuvant chemotherapy with Taxol andKanjinti. She is currently undergoing radiation. She is a participant in the UpBeat clinical trial.She presents to the clinic todayfor follow-up to discuss antiestrogen therapy.  REVIEW OF SYSTEMS:   Constitutional: Denies fevers, chills or abnormal weight loss Eyes: Denies blurriness of vision Ears, nose, mouth, throat, and face: Denies mucositis or sore throat  Respiratory: Denies cough, dyspnea or wheezes Cardiovascular: Denies palpitation, chest discomfort Gastrointestinal: Denies nausea, heartburn or change in bowel habits Skin: Denies abnormal skin rashes Lymphatics: Denies new lymphadenopathy or easy bruising Neurological: Denies numbness, tingling or new weaknesses Behavioral/Psych: Mood is stable, no new changes  Extremities: No lower extremity edema Breast: denies any pain or lumps or nodules in either breasts All other systems were reviewed with the patient and are negative.  I have reviewed the past medical history, past surgical history, social history and family history with the patient and they are unchanged from previous note.  ALLERGIES:  has No Known Allergies.  MEDICATIONS:  Current Outpatient Medications  Medication Sig Dispense Refill  . Ascorbic Acid (VITAMIN C PO) Take 1 tablet by mouth daily.    Marland Kitchen b complex vitamins tablet Take 1 tablet by mouth daily.    . carboxymethylcellul-glycerin (LUBRICATING EYE DROPS) 0.5-0.9 % ophthalmic solution Place 1 drop into both eyes as needed (dry eyes). 30 mL 1  . Cholecalciferol (DIALYVITE VITAMIN D 5000) 125 MCG (5000 UT) capsule Take 5,000 Units by mouth daily.    . Coenzyme Q10 (CO Q-10 PO) Take 1 capsule by mouth daily.    Marland Kitchen dexamethasone (DECADRON) 0.1 % ophthalmic suspension Place 1 drop into both eyes 2 (two) times daily. 15 mL 0  . diphenhydrAMINE (BENADRYL) 25 MG tablet Take 25 mg by mouth daily as needed for allergies.    . hydrochlorothiazide (HYDRODIURIL) 25 MG tablet Take 1 tablet (25 mg total) by mouth daily. 90 tablet 1  . ibuprofen (ADVIL) 600 MG tablet Take 1 tablet (600 mg total) by mouth every 6 (six) hours as needed. 30 tablet 2  . lidocaine-prilocaine (EMLA) cream Apply to affected area once 30 g 3  . LORazepam (ATIVAN) 0.5 MG tablet Take 1 tablet (0.5 mg total) by mouth at bedtime as needed for sleep. 30 tablet 0  . MAGNESIUM GLUCONATE PO Take 2 capsules by mouth  daily.    . metoprolol tartrate (LOPRESSOR) 50 MG tablet Take 1 tablet (50 mg total) by mouth 2 (two) times daily. 180 tablet 1  . ondansetron (ZOFRAN) 8 MG tablet Take 1 tablet (8 mg total) by mouth 2 (two) times daily as needed (Nausea or vomiting). 30 tablet 1  . prochlorperazine (COMPAZINE) 10 MG tablet Take 1 tablet (10 mg total) by mouth every 6 (six) hours as needed (Nausea or vomiting). 30 tablet 1  . Red Yeast Rice Extract (RED YEAST RICE PO) Take 2 capsules by mouth daily.    . simvastatin (ZOCOR) 10 MG tablet Take 1 tablet (10 mg total) by mouth every evening. 90 tablet 1   No current facility-administered medications for this visit.     PHYSICAL EXAMINATION: ECOG PERFORMANCE STATUS: 1 - Symptomatic but completely ambulatory  Vitals:   08/21/19 1042 08/21/19 1046  BP: 126/76 128/72  Pulse: 64 64  Resp: 18   Temp: 98.2 F (36.8 C)   SpO2: 100%    Filed Weights   08/21/19 1042  Weight: 170 lb 12.8 oz (77.5 kg)    GENERAL: alert, no distress and comfortable SKIN: skin color, texture, turgor are normal, no rashes or significant lesions EYES: normal, Conjunctiva are pink and non-injected, sclera clear OROPHARYNX: no exudate, no erythema and lips, buccal mucosa, and tongue normal  NECK: supple,  thyroid normal size, non-tender, without nodularity LYMPH: no palpable lymphadenopathy in the cervical, axillary or inguinal LUNGS: clear to auscultation and percussion with normal breathing effort HEART: regular rate & rhythm and no murmurs and no lower extremity edema ABDOMEN: abdomen soft, non-tender and normal bowel sounds MUSCULOSKELETAL: no cyanosis of digits and no clubbing  NEURO: alert & oriented x 3 with fluent speech, no focal motor/sensory deficits EXTREMITIES: No lower extremity edema  LABORATORY DATA:  I have reviewed the data as listed CMP Latest Ref Rng & Units 07/31/2019 07/24/2019 07/17/2019  Glucose 70 - 99 mg/dL 102(H) 95 98  BUN 8 - 23 mg/dL _0 Creatinine 0.44 - 1.00 mg/dL 0.76 0.77 0.88  Sodium 135 - 145 mmol/L 140 139 139  Potassium 3.5 - 5.1 mmol/L 3.7 3.8 3.9  Chloride 98 - 111 mmol/L 103 102 102  CO2 22 - 32 mmol/L _1 Calcium 8.9 - 10.3 mg/dL 10.5(H) 9.5 9.4  Total Protein 6.5 - 8.1 g/dL 7.1 6.7 6.8  Total Bilirubin 0.3 - 1.2 mg/dL 0.3 0.3 0.3  Alkaline Phos 38 - 126 U/L 59 55 57  AST 15 - 41 U/L _2 ALT 0 - 44 U/L _3 Lab Results  Component Value Date   WBC 3.4 (L) 08/21/2019   HGB 10.8 (L) 08/21/2019   HCT 33.1 (L) 08/21/2019   MCV 96.5 08/21/2019   PLT 288 08/21/2019   NEUTROABS 1.7 08/21/2019    ASSESSMENT & PLAN:  Carcinoma of upper-inner quadrant of right breast in female, estrogen receptor positive ([place]) 04/02/2019:Screening mammogram detected a mass in the right axilla. [place] confirmed a 0.9cm right breast mass. Biopsy showed IDC, grade 2, HER-2+ (3+), ER+ 100%, PR+ 5%, Ki67 15%.  Treatment plan: 1.Breast conserving surgery with sentinel lymph node biopsy 2.Adjuvant chemotherapy with Taxol Herceptin weekly x12 followed by Herceptin maintenance for 1 year started8/14/2020 3.Adjuvant radiation therapy will be starting 08/22/2019 4.Followed by adjuvant antiestrogen therapy with anastrozole 1 mg daily x5 to 7 years ------------------------------------------------------------------------------------------------------------------------------------------- Current Treatment: Herceptin maintenance therapy Discussed risks and benefits of Herceptin. Echocardiogram will be done next week. Based on previous normal echocardiogram, we will proceed with today's treatment without waiting for a new echo. I will see the patient back every 6 weeks for follow-up.  No orders of the defined types were placed in this encounter.  The patient has a good understanding of the overall plan. she agrees with it. she will call with any problems that may develop before the next visit here.  Nicholas Lose,  MD 08/21/2019  Julious Oka Dorshimer, am acting as scribe for Dr. Nicholas Lose.  I have reviewed the above documentation for accuracy and completeness, and I agree with the above.

## 2019-08-21 ENCOUNTER — Inpatient Hospital Stay (HOSPITAL_BASED_OUTPATIENT_CLINIC_OR_DEPARTMENT_OTHER): Payer: 59 | Admitting: Hematology and Oncology

## 2019-08-21 ENCOUNTER — Inpatient Hospital Stay: Payer: 59

## 2019-08-21 ENCOUNTER — Encounter: Payer: Self-pay | Admitting: *Deleted

## 2019-08-21 ENCOUNTER — Other Ambulatory Visit: Payer: Self-pay

## 2019-08-21 ENCOUNTER — Inpatient Hospital Stay: Payer: 59 | Attending: Hematology and Oncology

## 2019-08-21 DIAGNOSIS — Z170 Estrogen receptor positive status [ER+]: Secondary | ICD-10-CM

## 2019-08-21 DIAGNOSIS — Z79899 Other long term (current) drug therapy: Secondary | ICD-10-CM | POA: Insufficient documentation

## 2019-08-21 DIAGNOSIS — C50211 Malignant neoplasm of upper-inner quadrant of right female breast: Secondary | ICD-10-CM

## 2019-08-21 DIAGNOSIS — Z5111 Encounter for antineoplastic chemotherapy: Secondary | ICD-10-CM | POA: Diagnosis not present

## 2019-08-21 DIAGNOSIS — C50411 Malignant neoplasm of upper-outer quadrant of right female breast: Secondary | ICD-10-CM | POA: Insufficient documentation

## 2019-08-21 LAB — CBC WITH DIFFERENTIAL (CANCER CENTER ONLY)
Abs Immature Granulocytes: 0.01 10*3/uL (ref 0.00–0.07)
Basophils Absolute: 0.1 10*3/uL (ref 0.0–0.1)
Basophils Relative: 2 %
Eosinophils Absolute: 0.2 10*3/uL (ref 0.0–0.5)
Eosinophils Relative: 6 %
HCT: 33.1 % — ABNORMAL LOW (ref 36.0–46.0)
Hemoglobin: 10.8 g/dL — ABNORMAL LOW (ref 12.0–15.0)
Immature Granulocytes: 0 %
Lymphocytes Relative: 31 %
Lymphs Abs: 1.1 10*3/uL (ref 0.7–4.0)
MCH: 31.5 pg (ref 26.0–34.0)
MCHC: 32.6 g/dL (ref 30.0–36.0)
MCV: 96.5 fL (ref 80.0–100.0)
Monocytes Absolute: 0.3 10*3/uL (ref 0.1–1.0)
Monocytes Relative: 10 %
Neutro Abs: 1.7 10*3/uL (ref 1.7–7.7)
Neutrophils Relative %: 51 %
Platelet Count: 288 10*3/uL (ref 150–400)
RBC: 3.43 MIL/uL — ABNORMAL LOW (ref 3.87–5.11)
RDW: 14.4 % (ref 11.5–15.5)
WBC Count: 3.4 10*3/uL — ABNORMAL LOW (ref 4.0–10.5)
nRBC: 0 % (ref 0.0–0.2)

## 2019-08-21 LAB — CMP (CANCER CENTER ONLY)
ALT: 17 U/L (ref 0–44)
AST: 21 U/L (ref 15–41)
Albumin: 3.8 g/dL (ref 3.5–5.0)
Alkaline Phosphatase: 63 U/L (ref 38–126)
Anion gap: 7 (ref 5–15)
BUN: 16 mg/dL (ref 8–23)
CO2: 30 mmol/L (ref 22–32)
Calcium: 9.7 mg/dL (ref 8.9–10.3)
Chloride: 103 mmol/L (ref 98–111)
Creatinine: 0.81 mg/dL (ref 0.44–1.00)
GFR, Est AFR Am: >60 mL/min (ref >60)
GFR, Estimated: >60 mL/min (ref >60)
Glucose, Bld: 104 mg/dL — ABNORMAL HIGH (ref 70–99)
Potassium: 3.4 mmol/L — ABNORMAL LOW (ref 3.5–5.1)
Sodium: 140 mmol/L (ref 135–145)
Total Bilirubin: 0.4 mg/dL (ref 0.3–1.2)
Total Protein: 7 g/dL (ref 6.5–8.1)

## 2019-08-21 LAB — RESEARCH LABS

## 2019-08-21 MED ORDER — TRASTUZUMAB-ANNS CHEMO 150 MG IV SOLR
450.0000 mg | Freq: Once | INTRAVENOUS | Status: AC
  Administered 2019-08-21: 450 mg via INTRAVENOUS
  Filled 2019-08-21: qty 21.43

## 2019-08-21 MED ORDER — DIPHENHYDRAMINE HCL 25 MG PO CAPS
50.0000 mg | ORAL_CAPSULE | Freq: Once | ORAL | Status: AC
  Administered 2019-08-21: 50 mg via ORAL

## 2019-08-21 MED ORDER — ACETAMINOPHEN 325 MG PO TABS
ORAL_TABLET | ORAL | Status: AC
  Filled 2019-08-21: qty 2

## 2019-08-21 MED ORDER — HEPARIN SOD (PORK) LOCK FLUSH 100 UNIT/ML IV SOLN
500.0000 [IU] | Freq: Once | INTRAVENOUS | Status: AC | PRN
  Administered 2019-08-21: 500 [IU]
  Filled 2019-08-21: qty 5

## 2019-08-21 MED ORDER — DIPHENHYDRAMINE HCL 25 MG PO CAPS
ORAL_CAPSULE | ORAL | Status: AC
  Filled 2019-08-21: qty 2

## 2019-08-21 MED ORDER — ACETAMINOPHEN 325 MG PO TABS
650.0000 mg | ORAL_TABLET | Freq: Once | ORAL | Status: AC
  Administered 2019-08-21: 650 mg via ORAL

## 2019-08-21 MED ORDER — SODIUM CHLORIDE 0.9% FLUSH
10.0000 mL | INTRAVENOUS | Status: DC | PRN
  Administered 2019-08-21: 10 mL
  Filled 2019-08-21: qty 10

## 2019-08-21 MED ORDER — SODIUM CHLORIDE 0.9 % IV SOLN
Freq: Once | INTRAVENOUS | Status: AC
  Administered 2019-08-21: 11:00:00 via INTRAVENOUS
  Filled 2019-08-21: qty 250

## 2019-08-21 NOTE — Patient Instructions (Signed)
Tunneled Central Venous Catheter Flushing Guide  It is important to flush your tunneled central venous catheter each time you use it, both before and after you use it. Flushing your catheter will help prevent it from clogging. What are the risks? Risks may include:  Infection.  Air getting into the catheter and bloodstream. Supplies needed:  A clean pair of gloves.  A disinfecting wipe. Use an alcohol wipe, chlorhexidine wipe, or iodine wipe as told by your health care provider.  A 10 mL syringe that has been prefilled with saline solution.  An empty 10 mL syringe, if a substance called heparin was injected into your catheter. How to flush your catheter When you flush your catheter, make sure you follow any specific instructions from your health care provider or the manufacturer. These are general guidelines. Flushing your catheter before use If there is heparin in your catheter: 1. Wash your hands with soap and water. 2. Put on gloves. 3. Scrub the injection cap for a minimum of 15 seconds with a disinfecting wipe. 4. Unclamp the catheter. 5. Attach the empty syringe to the injection cap. 6. Pull the syringe plunger back and withdraw 10 mL of blood. 7. Place the syringe into an appropriate waste container. 8. Scrub the injection cap for 15 seconds with a disinfecting wipe. 9. Attach the prefilled syringe to the injection cap. 10. Flush the catheter by pushing the plunger forward until all the liquid from the syringe is in the catheter. 11. Remove the syringe from the injection cap. 12. Clamp the catheter. If there is no heparin in your catheter: 1. Wash your hands with soap and water. 2. Put on gloves. 3. Scrub the injection cap for 15 seconds with a disinfecting wipe. 4. Unclamp the catheter. 5. Attach the prefilled syringe to the injection cap. 6. Flush the catheter by pushing the plunger forward until 5 mL of the liquid from the syringe is in the catheter. 7. Pull back on  the syringe until you see blood in the catheter. 8. If you have been asked to collect any blood, follow your health care provider's instructions. Otherwise, flush the catheter with the rest of the solution from the syringe. 9. Remove the syringe from the injection cap. 10. Clamp the catheter.  Flushing your catheter after use 1. Wash your hands with soap and water. 2. Put on gloves. 3. Scrub the injection cap for 15 seconds with a disinfecting wipe. 4. Unclamp the catheter. 5. Attach the prefilled syringe to the injection cap. 6. Flush the catheter by pushing the plunger forward until all of the liquid from the syringe is in the catheter. 7. Remove the syringe from the injection cap. 8. Clamp the catheter. Problems and solutions  If blood cannot be completely cleared from the injection cap, you may need to have the injection cap replaced.  If the catheter is difficult to flush, use the pulsing method. The pulsing method involves pushing only a few milliliters of solution into the catheter at a time and pausing between pushes.  If you do not see blood in the catheter when you pull back on the syringe, change your body position, such as by raising your arms above your head. Take a deep breath and cough. Then, pull back on the syringe. If you still do not see blood, flush the catheter with a small amount of solution. Then, change positions again and take a breath or cough. Pull back on the syringe again. If you still do not see   blood, finish flushing the catheter and contact your health care provider. Do not use your catheter until your health care provider says it is okay. General tips  Have someone help you flush your catheter, if possible.  Do not force fluid through your catheter.  Do not use a syringe that is larger or smaller than 10 mL. Using a smaller syringe can make the catheter burst.  Do not use your catheter without flushing it first if it has heparin in it. Contact a health  care provider if:  You cannot see any blood in the catheter when you flush it before using it.  Your catheter is difficult to flush. Get help right away if:  You cannot flush the catheter.  The catheter leaks when you flush it or when there is fluid in it.  There are cracks or breaks in the catheter. Summary  It is important to flush your tunneled central venous catheter each time you use it, both before and after you use it.  Scrub the injection cap for 15 seconds with a disinfecting wipe before and after you flush it.  When you flush your catheter, make sure you follow any specific instructions from your health care provider or the manufacturer.  Get help right away if you cannot flush the catheter. This information is not intended to replace advice given to you by your health care provider. Make sure you discuss any questions you have with your health care provider. Document Released: 09/06/2011 Document Revised: 12/03/2018 Document Reviewed: 12/03/2018 Elsevier Patient Education  2020 Elsevier Inc.  

## 2019-08-21 NOTE — Patient Instructions (Signed)
[facility] Discharge Instructions for Patients Receiving Chemotherapy  Today you received the following chemotherapy agents Kanjinti.  To help prevent nausea and vomiting after your treatment, we encourage you to take your nausea medication   If you develop nausea and vomiting that is not controlled by your nausea medication, call the clinic.   BELOW ARE SYMPTOMS THAT SHOULD BE REPORTED IMMEDIATELY:  *FEVER GREATER THAN 100.5 F  *CHILLS WITH OR WITHOUT FEVER  NAUSEA AND VOMITING THAT IS NOT CONTROLLED WITH YOUR NAUSEA MEDICATION  *UNUSUAL SHORTNESS OF BREATH  *UNUSUAL BRUISING OR BLEEDING  TENDERNESS IN MOUTH AND THROAT WITH OR WITHOUT PRESENCE OF ULCERS  *URINARY PROBLEMS  *BOWEL PROBLEMS  UNUSUAL RASH Items with * indicate a potential emergency and should be followed up as soon as possible.  Feel free to call the clinic should you have any questions or concerns. The clinic phone number is [phone].  Please show the [place] at check-in to the Emergency Department and triage nurse.

## 2019-08-21 NOTE — Assessment & Plan Note (Signed)
04/02/2019:Screening mammogram detected a mass in the right axilla. [place] confirmed a 0.9cm right breast mass. Biopsy showed IDC, grade 2, HER-2+ (3+), ER+ 100%, PR+ 5%, Ki67 15%.  Treatment plan: 1.Breast conserving surgery with sentinel lymph node biopsy 2.Adjuvant chemotherapy with Taxol Herceptin weekly x12 followed by Herceptin maintenance for 1 year started8/14/2020 3.Adjuvant radiation therapy 4.Followed by adjuvant antiestrogen therapy with anastrozole 1 mg daily x5 to 7 years ------------------------------------------------------------------------------------------------------------------------------------------- Current Treatment:

## 2019-08-21 NOTE — Research (Signed)
08/21/2019 at 4:37pm - UpBeat month 3 assessments- The pt was into the cancer center this morning for her routine labs, MD visit, and chemotherapy.  The pt agreed to have her month 3 UpBeat research labs added to her routine lab appt this morning.  The nurse called the pt on 08/20/19 to remind her to fast for at least 3 hours before her blood draw.  The nurse met the pt upon arrival to the [facility], and the pt confirmed that she had fasted for 3 hours this morning.  The pt was given her month 3 self-administered questionnaires to complete.  The research nurse reviewed the pt's completed questionnaires for accuracy and completeness.  The nurse assessed the pt's questionnaire for depressive symptoms, the pt's score did not meet criteria for an intervention.  The pt research labs were drawn.  The pt did not sign for her samples to be kept in a [facility] for future DNA health research.  The pt then had her height, weight and 2 sets of BP and pulse obtained per protocol guidance.  The pt's current medications were reviewed with the pt. The pt reports no changes in her medications.  The pt was seen and examined by Dr. Lindi Adie this morning, and the pt received her chemotherapy.  The pt was reminded that she will complete her RT on Tuesday and then meet with the research staff to complete her month 3 assessments (physical and neuro testing).  The pt is scheduled for her cardiac MRI on 08/25/19.  The pt is also scheduled to begin her RT next week.  The pt was thanked for her support of this trial. The pt was informed that she will be given her gift card and gift bag after all of her month 3 assessments are completed.   Brion Aliment RN, BSN, [place]  Clinical Research Nurse 08/21/2019 4:50 PM   08/25/2019 at 1:28pm - The pt was into the [facility] for her radiation today.  The pt met with the research nurse after her RT appt to complete her month 3 assessments this morning.  The research nurse had the pt complete some of the items  in the self-administered questionnaires that she had overlooked when she initially completed the questionnaires on 11/20.  The pt was given a copy of her [facility] research values for her records.  The pt was informed that Dr. Lindi Adie had reviewed them, and he felt no interventions were needed.  The pt then completed her neurocognitive training with Remer Macho, research specialist.  The pt then had her waist measurement obtained by the research nurse.  The pt's waist measurement was 36 inches.  The pt then completed her physical testing ( 6 minute walk, disabilities measures, and SPPB assessements).  After her testing, the pt was walked to the MRI department for her month 3 cardiac MRI.  The pt denied any cardiac events since her last visit.  The pt completed her MRI, and the pt was given her month 3 gift bag along with her $25 gift card. The pt was thanked for her continued support of this study.  The pt was informed that her next required study will be in August 2021 for her month 12 assessments.   Brion Aliment RN, BSN, CCRP Clinical Research Nurse 08/25/2019 1:39 PM

## 2019-08-23 ENCOUNTER — Other Ambulatory Visit: Payer: Self-pay

## 2019-08-23 ENCOUNTER — Ambulatory Visit
Admission: RE | Admit: 2019-08-23 | Discharge: 2019-08-23 | Disposition: A | Discharge status: Home / Self Care | Payer: 59 | Source: Ambulatory Visit | Attending: Radiation Oncology | Admitting: Radiation Oncology

## 2019-08-23 DIAGNOSIS — Z510 Encounter for antineoplastic radiation therapy: Secondary | ICD-10-CM | POA: Diagnosis not present

## 2019-08-24 ENCOUNTER — Other Ambulatory Visit: Payer: Self-pay

## 2019-08-24 ENCOUNTER — Ambulatory Visit
Admission: RE | Admit: 2019-08-24 | Discharge: 2019-08-24 | Disposition: A | Discharge status: Home / Self Care | Payer: 59 | Source: Ambulatory Visit | Attending: Radiation Oncology | Admitting: Radiation Oncology

## 2019-08-24 DIAGNOSIS — C50411 Malignant neoplasm of upper-outer quadrant of right female breast: Secondary | ICD-10-CM

## 2019-08-24 DIAGNOSIS — Z170 Estrogen receptor positive status [ER+]: Secondary | ICD-10-CM

## 2019-08-24 DIAGNOSIS — Z510 Encounter for antineoplastic radiation therapy: Secondary | ICD-10-CM | POA: Diagnosis not present

## 2019-08-24 MED ORDER — ALRA NON-METALLIC DEODORANT (RAD-ONC)
1.0000 "application " | Freq: Once | TOPICAL | Status: AC
  Administered 2019-08-24: 1 via TOPICAL

## 2019-08-24 MED ORDER — SONAFINE EX EMUL
1.0000 "application " | Freq: Once | CUTANEOUS | Status: AC
  Administered 2019-08-24: 1 via TOPICAL

## 2019-08-24 NOTE — Progress Notes (Signed)

## 2019-08-25 ENCOUNTER — Ambulatory Visit (HOSPITAL_COMMUNITY)
Admission: RE | Admit: 2019-08-25 | Discharge: 2019-08-25 | Disposition: A | Discharge status: Home / Self Care | Payer: 59 | Source: Ambulatory Visit | Attending: Hematology and Oncology | Admitting: Hematology and Oncology

## 2019-08-25 ENCOUNTER — Ambulatory Visit
Admission: RE | Admit: 2019-08-25 | Discharge: 2019-08-25 | Disposition: A | Discharge status: Home / Self Care | Payer: 59 | Source: Ambulatory Visit | Attending: Radiation Oncology | Admitting: Radiation Oncology

## 2019-08-25 ENCOUNTER — Other Ambulatory Visit: Payer: Self-pay

## 2019-08-25 DIAGNOSIS — Z006 Encounter for examination for normal comparison and control in clinical research program: Secondary | ICD-10-CM | POA: Insufficient documentation

## 2019-08-25 DIAGNOSIS — Z510 Encounter for antineoplastic radiation therapy: Secondary | ICD-10-CM | POA: Diagnosis not present

## 2019-08-26 ENCOUNTER — Ambulatory Visit
Admission: RE | Admit: 2019-08-26 | Discharge: 2019-08-26 | Disposition: A | Discharge status: Home / Self Care | Payer: 59 | Source: Ambulatory Visit | Attending: Radiation Oncology | Admitting: Radiation Oncology

## 2019-08-26 ENCOUNTER — Other Ambulatory Visit: Payer: Self-pay

## 2019-08-26 DIAGNOSIS — Z510 Encounter for antineoplastic radiation therapy: Secondary | ICD-10-CM | POA: Diagnosis not present

## 2019-08-31 ENCOUNTER — Other Ambulatory Visit: Payer: Self-pay

## 2019-08-31 ENCOUNTER — Ambulatory Visit
Admission: RE | Admit: 2019-08-31 | Discharge: 2019-08-31 | Disposition: A | Discharge status: Home / Self Care | Payer: 59 | Source: Ambulatory Visit | Attending: Radiation Oncology | Admitting: Radiation Oncology

## 2019-08-31 DIAGNOSIS — Z510 Encounter for antineoplastic radiation therapy: Secondary | ICD-10-CM | POA: Diagnosis not present

## 2019-09-01 ENCOUNTER — Ambulatory Visit
Admission: RE | Admit: 2019-09-01 | Discharge: 2019-09-01 | Disposition: A | Discharge status: Home / Self Care | Payer: 59 | Source: Ambulatory Visit | Attending: Radiation Oncology | Admitting: Radiation Oncology

## 2019-09-01 ENCOUNTER — Other Ambulatory Visit: Payer: Self-pay

## 2019-09-01 DIAGNOSIS — Z510 Encounter for antineoplastic radiation therapy: Secondary | ICD-10-CM | POA: Insufficient documentation

## 2019-09-01 DIAGNOSIS — Z170 Estrogen receptor positive status [ER+]: Secondary | ICD-10-CM | POA: Insufficient documentation

## 2019-09-01 DIAGNOSIS — C50211 Malignant neoplasm of upper-inner quadrant of right female breast: Secondary | ICD-10-CM | POA: Insufficient documentation

## 2019-09-02 ENCOUNTER — Ambulatory Visit
Admission: RE | Admit: 2019-09-02 | Discharge: 2019-09-02 | Disposition: A | Discharge status: Home / Self Care | Payer: 59 | Source: Ambulatory Visit | Attending: Radiation Oncology | Admitting: Radiation Oncology

## 2019-09-02 ENCOUNTER — Other Ambulatory Visit: Payer: Self-pay

## 2019-09-02 DIAGNOSIS — Z510 Encounter for antineoplastic radiation therapy: Secondary | ICD-10-CM | POA: Diagnosis not present

## 2019-09-03 ENCOUNTER — Other Ambulatory Visit: Payer: Self-pay

## 2019-09-03 ENCOUNTER — Ambulatory Visit
Admission: RE | Admit: 2019-09-03 | Discharge: 2019-09-03 | Disposition: A | Discharge status: Home / Self Care | Payer: 59 | Source: Ambulatory Visit | Attending: Radiation Oncology | Admitting: Radiation Oncology

## 2019-09-03 DIAGNOSIS — Z510 Encounter for antineoplastic radiation therapy: Secondary | ICD-10-CM | POA: Diagnosis not present

## 2019-09-04 ENCOUNTER — Ambulatory Visit
Admission: RE | Admit: 2019-09-04 | Discharge: 2019-09-04 | Disposition: A | Discharge status: Home / Self Care | Payer: 59 | Source: Ambulatory Visit | Attending: Radiation Oncology | Admitting: Radiation Oncology

## 2019-09-04 ENCOUNTER — Other Ambulatory Visit: Payer: Self-pay

## 2019-09-04 DIAGNOSIS — Z510 Encounter for antineoplastic radiation therapy: Secondary | ICD-10-CM | POA: Diagnosis not present

## 2019-09-07 ENCOUNTER — Other Ambulatory Visit: Payer: Self-pay

## 2019-09-07 ENCOUNTER — Ambulatory Visit
Admission: RE | Admit: 2019-09-07 | Discharge: 2019-09-07 | Disposition: A | Discharge status: Home / Self Care | Payer: 59 | Source: Ambulatory Visit | Attending: Radiation Oncology | Admitting: Radiation Oncology

## 2019-09-07 DIAGNOSIS — Z510 Encounter for antineoplastic radiation therapy: Secondary | ICD-10-CM | POA: Diagnosis not present

## 2019-09-08 ENCOUNTER — Other Ambulatory Visit: Payer: Self-pay

## 2019-09-08 ENCOUNTER — Ambulatory Visit
Admission: RE | Admit: 2019-09-08 | Discharge: 2019-09-08 | Disposition: A | Discharge status: Home / Self Care | Payer: 59 | Source: Ambulatory Visit | Attending: Radiation Oncology | Admitting: Radiation Oncology

## 2019-09-08 DIAGNOSIS — Z510 Encounter for antineoplastic radiation therapy: Secondary | ICD-10-CM | POA: Diagnosis not present

## 2019-09-09 ENCOUNTER — Ambulatory Visit
Admission: RE | Admit: 2019-09-09 | Discharge: 2019-09-09 | Disposition: A | Discharge status: Home / Self Care | Payer: 59 | Source: Ambulatory Visit | Attending: Radiation Oncology | Admitting: Radiation Oncology

## 2019-09-09 ENCOUNTER — Other Ambulatory Visit: Payer: Self-pay

## 2019-09-09 DIAGNOSIS — Z510 Encounter for antineoplastic radiation therapy: Secondary | ICD-10-CM | POA: Diagnosis not present

## 2019-09-10 ENCOUNTER — Ambulatory Visit
Admission: RE | Admit: 2019-09-10 | Discharge: 2019-09-10 | Disposition: A | Discharge status: Home / Self Care | Payer: 59 | Source: Ambulatory Visit | Attending: Radiation Oncology | Admitting: Radiation Oncology

## 2019-09-10 ENCOUNTER — Other Ambulatory Visit: Payer: Self-pay | Admitting: *Deleted

## 2019-09-10 ENCOUNTER — Other Ambulatory Visit: Payer: Self-pay

## 2019-09-10 DIAGNOSIS — Z510 Encounter for antineoplastic radiation therapy: Secondary | ICD-10-CM | POA: Diagnosis not present

## 2019-09-10 DIAGNOSIS — Z79899 Other long term (current) drug therapy: Secondary | ICD-10-CM

## 2019-09-10 DIAGNOSIS — Z5181 Encounter for therapeutic drug level monitoring: Secondary | ICD-10-CM

## 2019-09-10 NOTE — Progress Notes (Signed)
Per MD okay to treat on 09/11/2019 with echocardiogram from 04/24/2019.  Orders placed for pt to have echo repeated in one week.

## 2019-09-11 ENCOUNTER — Other Ambulatory Visit: Payer: Self-pay

## 2019-09-11 ENCOUNTER — Inpatient Hospital Stay: Payer: 59 | Attending: Hematology and Oncology

## 2019-09-11 ENCOUNTER — Ambulatory Visit
Admission: RE | Admit: 2019-09-11 | Discharge: 2019-09-11 | Disposition: A | Discharge status: Home / Self Care | Payer: 59 | Source: Ambulatory Visit | Attending: Radiation Oncology | Admitting: Radiation Oncology

## 2019-09-11 VITALS — BP 156/90 | HR 89 | Temp 97.7°F | Resp 18

## 2019-09-11 DIAGNOSIS — Z170 Estrogen receptor positive status [ER+]: Secondary | ICD-10-CM | POA: Insufficient documentation

## 2019-09-11 DIAGNOSIS — D72819 Decreased white blood cell count, unspecified: Secondary | ICD-10-CM | POA: Diagnosis not present

## 2019-09-11 DIAGNOSIS — Z79899 Other long term (current) drug therapy: Secondary | ICD-10-CM | POA: Insufficient documentation

## 2019-09-11 DIAGNOSIS — D6481 Anemia due to antineoplastic chemotherapy: Secondary | ICD-10-CM | POA: Insufficient documentation

## 2019-09-11 DIAGNOSIS — G629 Polyneuropathy, unspecified: Secondary | ICD-10-CM | POA: Diagnosis not present

## 2019-09-11 DIAGNOSIS — Z5111 Encounter for antineoplastic chemotherapy: Secondary | ICD-10-CM | POA: Insufficient documentation

## 2019-09-11 DIAGNOSIS — C50411 Malignant neoplasm of upper-outer quadrant of right female breast: Secondary | ICD-10-CM | POA: Insufficient documentation

## 2019-09-11 DIAGNOSIS — Z510 Encounter for antineoplastic radiation therapy: Secondary | ICD-10-CM | POA: Diagnosis not present

## 2019-09-11 DIAGNOSIS — C50211 Malignant neoplasm of upper-inner quadrant of right female breast: Secondary | ICD-10-CM

## 2019-09-11 MED ORDER — HEPARIN SOD (PORK) LOCK FLUSH 100 UNIT/ML IV SOLN
500.0000 [IU] | Freq: Once | INTRAVENOUS | Status: AC | PRN
  Administered 2019-09-11: 500 [IU]
  Filled 2019-09-11: qty 5

## 2019-09-11 MED ORDER — ACETAMINOPHEN 325 MG PO TABS
ORAL_TABLET | ORAL | Status: AC
  Filled 2019-09-11: qty 2

## 2019-09-11 MED ORDER — ACETAMINOPHEN 325 MG PO TABS
650.0000 mg | ORAL_TABLET | Freq: Once | ORAL | Status: AC
  Administered 2019-09-11: 650 mg via ORAL

## 2019-09-11 MED ORDER — DIPHENHYDRAMINE HCL 25 MG PO CAPS
ORAL_CAPSULE | ORAL | Status: AC
  Filled 2019-09-11: qty 2

## 2019-09-11 MED ORDER — SODIUM CHLORIDE 0.9 % IV SOLN
Freq: Once | INTRAVENOUS | Status: AC
  Administered 2019-09-11: 09:00:00 via INTRAVENOUS
  Filled 2019-09-11: qty 250

## 2019-09-11 MED ORDER — TRASTUZUMAB-ANNS CHEMO 150 MG IV SOLR
450.0000 mg | Freq: Once | INTRAVENOUS | Status: AC
  Administered 2019-09-11: 450 mg via INTRAVENOUS
  Filled 2019-09-11: qty 21.43

## 2019-09-11 MED ORDER — SODIUM CHLORIDE 0.9% FLUSH
10.0000 mL | INTRAVENOUS | Status: DC | PRN
  Administered 2019-09-11: 10 mL
  Filled 2019-09-11: qty 10

## 2019-09-11 MED ORDER — DIPHENHYDRAMINE HCL 25 MG PO CAPS
50.0000 mg | ORAL_CAPSULE | Freq: Once | ORAL | Status: AC
  Administered 2019-09-11: 50 mg via ORAL

## 2019-09-11 NOTE — Patient Instructions (Signed)
[facility] Discharge Instructions for Patients Receiving Chemotherapy  Today you received the following chemotherapy agents Kanjinti.  To help prevent nausea and vomiting after your treatment, we encourage you to take your nausea medication   If you develop nausea and vomiting that is not controlled by your nausea medication, call the clinic.   BELOW ARE SYMPTOMS THAT SHOULD BE REPORTED IMMEDIATELY:  *FEVER GREATER THAN 100.5 F  *CHILLS WITH OR WITHOUT FEVER  NAUSEA AND VOMITING THAT IS NOT CONTROLLED WITH YOUR NAUSEA MEDICATION  *UNUSUAL SHORTNESS OF BREATH  *UNUSUAL BRUISING OR BLEEDING  TENDERNESS IN MOUTH AND THROAT WITH OR WITHOUT PRESENCE OF ULCERS  *URINARY PROBLEMS  *BOWEL PROBLEMS  UNUSUAL RASH Items with * indicate a potential emergency and should be followed up as soon as possible.  Feel free to call the clinic should you have any questions or concerns. The clinic phone number is [phone].  Please show the [place] at check-in to the Emergency Department and triage nurse.

## 2019-09-14 ENCOUNTER — Other Ambulatory Visit: Payer: Self-pay

## 2019-09-14 ENCOUNTER — Ambulatory Visit
Admission: RE | Admit: 2019-09-14 | Discharge: 2019-09-14 | Disposition: A | Discharge status: Home / Self Care | Payer: 59 | Source: Ambulatory Visit | Attending: Radiation Oncology | Admitting: Radiation Oncology

## 2019-09-14 DIAGNOSIS — Z510 Encounter for antineoplastic radiation therapy: Secondary | ICD-10-CM | POA: Diagnosis not present

## 2019-09-15 ENCOUNTER — Other Ambulatory Visit: Payer: Self-pay

## 2019-09-15 ENCOUNTER — Ambulatory Visit
Admission: RE | Admit: 2019-09-15 | Discharge: 2019-09-15 | Disposition: A | Discharge status: Home / Self Care | Payer: 59 | Source: Ambulatory Visit | Attending: Radiation Oncology | Admitting: Radiation Oncology

## 2019-09-15 DIAGNOSIS — Z510 Encounter for antineoplastic radiation therapy: Secondary | ICD-10-CM | POA: Diagnosis not present

## 2019-09-16 ENCOUNTER — Ambulatory Visit
Admission: RE | Admit: 2019-09-16 | Discharge: 2019-09-16 | Disposition: A | Discharge status: Home / Self Care | Payer: 59 | Source: Ambulatory Visit | Attending: Radiation Oncology | Admitting: Radiation Oncology

## 2019-09-16 ENCOUNTER — Ambulatory Visit (HOSPITAL_COMMUNITY)
Admission: RE | Admit: 2019-09-16 | Discharge: 2019-09-16 | Disposition: A | Discharge status: Home / Self Care | Payer: 59 | Source: Ambulatory Visit | Attending: Hematology and Oncology | Admitting: Hematology and Oncology

## 2019-09-16 ENCOUNTER — Other Ambulatory Visit: Payer: Self-pay

## 2019-09-16 DIAGNOSIS — Z79899 Other long term (current) drug therapy: Secondary | ICD-10-CM | POA: Insufficient documentation

## 2019-09-16 DIAGNOSIS — Z5181 Encounter for therapeutic drug level monitoring: Secondary | ICD-10-CM | POA: Diagnosis present

## 2019-09-16 DIAGNOSIS — I517 Cardiomegaly: Secondary | ICD-10-CM | POA: Insufficient documentation

## 2019-09-16 DIAGNOSIS — C50919 Malignant neoplasm of unspecified site of unspecified female breast: Secondary | ICD-10-CM | POA: Diagnosis not present

## 2019-09-16 DIAGNOSIS — Z923 Personal history of irradiation: Secondary | ICD-10-CM | POA: Diagnosis not present

## 2019-09-16 DIAGNOSIS — Z510 Encounter for antineoplastic radiation therapy: Secondary | ICD-10-CM | POA: Diagnosis not present

## 2019-09-16 NOTE — Progress Notes (Signed)
*  PRELIMINARY RESULTS* Echocardiogram 2D Echocardiogram has been performed.  Emily Ross 09/16/2019, 9:45 AM

## 2019-09-17 ENCOUNTER — Other Ambulatory Visit: Payer: Self-pay

## 2019-09-17 ENCOUNTER — Ambulatory Visit
Admission: RE | Admit: 2019-09-17 | Discharge: 2019-09-17 | Disposition: A | Discharge status: Home / Self Care | Payer: 59 | Source: Ambulatory Visit | Attending: Radiation Oncology | Admitting: Radiation Oncology

## 2019-09-17 DIAGNOSIS — Z510 Encounter for antineoplastic radiation therapy: Secondary | ICD-10-CM | POA: Diagnosis not present

## 2019-09-18 ENCOUNTER — Ambulatory Visit
Admission: RE | Admit: 2019-09-18 | Discharge: 2019-09-18 | Disposition: A | Discharge status: Home / Self Care | Payer: 59 | Source: Ambulatory Visit | Attending: Radiation Oncology | Admitting: Radiation Oncology

## 2019-09-18 ENCOUNTER — Other Ambulatory Visit: Payer: Self-pay

## 2019-09-18 DIAGNOSIS — Z510 Encounter for antineoplastic radiation therapy: Secondary | ICD-10-CM | POA: Diagnosis not present

## 2019-09-21 ENCOUNTER — Ambulatory Visit
Admission: RE | Admit: 2019-09-21 | Discharge: 2019-09-21 | Disposition: A | Discharge status: Home / Self Care | Payer: 59 | Source: Ambulatory Visit | Attending: Radiation Oncology | Admitting: Radiation Oncology

## 2019-09-21 ENCOUNTER — Other Ambulatory Visit: Payer: Self-pay

## 2019-09-21 ENCOUNTER — Encounter: Payer: Self-pay | Admitting: *Deleted

## 2019-09-21 ENCOUNTER — Encounter: Payer: Self-pay | Admitting: Radiation Oncology

## 2019-09-21 DIAGNOSIS — Z510 Encounter for antineoplastic radiation therapy: Secondary | ICD-10-CM | POA: Diagnosis not present

## 2019-09-22 ENCOUNTER — Ambulatory Visit: Payer: 59

## 2019-09-29 ENCOUNTER — Other Ambulatory Visit: Payer: Self-pay | Admitting: Obstetrics and Gynecology

## 2019-09-29 ENCOUNTER — Encounter: Payer: Self-pay | Admitting: *Deleted

## 2019-09-30 NOTE — Progress Notes (Signed)
Patient Care Team: Emily Ross, [place] as PCP - General (General Practice) Mauro Kaufmann, RN as Oncology Nurse Navigator Rockwell [place], RN as Oncology Nurse Navigator Nicholas Lose, MD as Consulting Physician (Hematology and Oncology) Rolm Bookbinder, MD as Consulting Physician (General Surgery) Eppie [place], MD as Attending Physician (Radiation Oncology)  DIAGNOSIS:    ICD-10-CM   1. Carcinoma of upper-inner quadrant of right breast in female, estrogen receptor positive ([place])  C50.211    Z17.0     SUMMARY OF ONCOLOGIC HISTORY: Oncology History  Malignant neoplasm of upper-outer quadrant of right breast in female, estrogen receptor positive ([place])  04/02/2019 Initial Diagnosis   Screening mammogram detected a mass in the right axilla. [place] confirmed a 0.9cm right breast mass. Biopsy showed IDC, grade 2, HER-2+ (3+), ER+ 100%, PR+ 5%, Ki67 15%.    04/08/2019 Cancer Staging   Staging form: Breast, AJCC 8th Edition - Clinical stage from 04/08/2019: Stage IA (cT1b, cN0, cM0, G2, ER+, PR+, HER2+) - Signed by Nicholas Lose, MD on 04/08/2019   04/14/2019 Genetic Testing   Patient had genetic testing done for her personal and family history of breast cancer.  Negative genetic testing on the [facility] Breast Cancer STAT panel. The STAT Breast cancer panel offered by [facility] includes sequencing and rearrangement analysis for the following 9 genes:  ATM, BRCA1, BRCA2, CDH1, CHEK2, PALB2, PTEN, STK11 and TP53.  The report date is 04/14/2019.   04/17/2019 Surgery   Right lumpectomy [name]): IDC, 2.0cm, grade 2, clear margins, 5 axillary lymph nodes negative for carcinoma   08/24/2019 - 09/21/2019 Radiation Therapy   Adjuvant radiation   Carcinoma of upper-inner quadrant of right breast in female, estrogen receptor positive ([place])  04/08/2019 Cancer Staging   Staging form: Breast, AJCC 8th Edition - Clinical stage from 04/08/2019: Stage IA (cT1b, cN0, cM0, G2, ER+, PR+, HER2+) - Signed by Eppie [place], MD on 04/08/2019   05/15/2019 -  Chemotherapy   The patient had PACLitaxel (TAXOL) 144 mg in sodium chloride 0.9 % 250 mL chemo infusion (</= 24m/m2), 80 mg/m2 = 144 mg, Intravenous,  Once, 3 of 3 cycles Administration: 144 mg (05/15/2019), 144 mg (05/22/2019), 144 mg (06/12/2019), 144 mg (05/29/2019), 144 mg (06/05/2019), 144 mg (06/19/2019), 144 mg (06/26/2019), 144 mg (07/03/2019), 144 mg (07/10/2019), 144 mg (07/17/2019), 144 mg (07/24/2019), 144 mg (07/31/2019) trastuzumab-anns (KANJINTI) 300 mg in sodium chloride 0.9 % 250 mL chemo infusion, 294 mg (100 % of original dose 4 mg/kg), Intravenous,  Once, 5 of 16 cycles Dose modification: 4 mg/kg (original dose 4 mg/kg, Cycle 1, Reason: Other (see comments), Comment: load), 6 mg/kg (original dose 2 mg/kg, Cycle 3, Reason: Other (see comments), Comment: q3wk dosing), 2 mg/kg (original dose 2 mg/kg, Cycle 3, Reason: Other (see comments)), 450 mg (original dose 2 mg/kg, Cycle 3, Reason: Other (see comments), Comment: Maintenance dosing), 450 mg (original dose 6 mg/kg, Cycle 4, Reason: Other (see comments), Comment: rounded; vial size) Administration: 300 mg (05/15/2019), 150 mg (05/22/2019), 150 mg (05/29/2019), 150 mg (06/05/2019), 150 mg (06/12/2019), 150 mg (06/19/2019), 150 mg (06/26/2019), 150 mg (07/03/2019), 150 mg (07/10/2019), 150 mg (07/17/2019), 150 mg (07/24/2019), 450 mg (07/31/2019), 450 mg (08/21/2019), 450 mg (09/11/2019)  for chemotherapy treatment.      CHIEF COMPLIANT: Follow-up to discuss anti-estrogen therapy  INTERVAL HISTORY: Emily SAVARYis a 63y.o. with above-mentioned history of right breast cancerwho underwent a lumpectomy, adjuvant chemotherapy, and radiation. She is a participant in the UpBeat clinical trial.She presents to  the clinic today to discuss antiestrogen therapy.  She is tolerating Herceptin extremely well without any problems or concerns.  REVIEW OF SYSTEMS:   Constitutional: Denies fevers, chills or abnormal weight  loss Eyes: Denies blurriness of vision Ears, nose, mouth, throat, and face: Denies mucositis or sore throat Respiratory: Denies cough, dyspnea or wheezes Cardiovascular: Denies palpitation, chest discomfort Gastrointestinal: Denies nausea, heartburn or change in bowel habits Skin: Denies abnormal skin rashes Lymphatics: Denies new lymphadenopathy or easy bruising Neurological: Denies numbness, tingling or new weaknesses Behavioral/Psych: Mood is stable, no new changes  Extremities: No lower extremity edema Breast: denies any pain or lumps or nodules in either breasts All other systems were reviewed with the patient and are negative.  I have reviewed the past medical history, past surgical history, social history and family history with the patient and they are unchanged from previous note.  ALLERGIES:  has No Known Allergies.  MEDICATIONS:  Current Outpatient Medications  Medication Sig Dispense Refill  . Ascorbic Acid (VITAMIN C PO) Take 1 tablet by mouth daily.    Marland Kitchen b complex vitamins tablet Take 1 tablet by mouth daily.    . carboxymethylcellul-glycerin (LUBRICATING EYE DROPS) 0.5-0.9 % ophthalmic solution Place 1 drop into both eyes as needed (dry eyes). 30 mL 1  . Cholecalciferol (DIALYVITE VITAMIN D 5000) 125 MCG (5000 UT) capsule Take 5,000 Units by mouth daily.    . Coenzyme Q10 (CO Q-10 PO) Take 1 capsule by mouth daily.    Marland Kitchen dexamethasone (DECADRON) 0.1 % ophthalmic suspension Place 1 drop into both eyes 2 (two) times daily. (Patient not taking: Reported on 09/11/2019) 15 mL 0  . diphenhydrAMINE (BENADRYL) 25 MG tablet Take 25 mg by mouth daily as needed for allergies.    . hydrochlorothiazide (HYDRODIURIL) 25 MG tablet Take 1 tablet (25 mg total) by mouth daily. 90 tablet 1  . ibuprofen (ADVIL) 600 MG tablet Take 1 tablet (600 mg total) by mouth every 6 (six) hours as needed. 30 tablet 2  . lidocaine-prilocaine (EMLA) cream Apply to affected area once 30 g 3  . LORazepam  (ATIVAN) 0.5 MG tablet Take 1 tablet (0.5 mg total) by mouth at bedtime as needed for sleep. (Patient not taking: Reported on 09/11/2019) 30 tablet 0  . MAGNESIUM GLUCONATE PO Take 2 capsules by mouth daily.    . metoprolol tartrate (LOPRESSOR) 50 MG tablet Take 1 tablet (50 mg total) by mouth 2 (two) times daily. 180 tablet 1  . ondansetron (ZOFRAN) 8 MG tablet Take 1 tablet (8 mg total) by mouth 2 (two) times daily as needed (Nausea or vomiting). 30 tablet 1  . prochlorperazine (COMPAZINE) 10 MG tablet Take 1 tablet (10 mg total) by mouth every 6 (six) hours as needed (Nausea or vomiting). 30 tablet 1  . Red Yeast Rice Extract (RED YEAST RICE PO) Take 2 capsules by mouth daily.    . simvastatin (ZOCOR) 10 MG tablet Take 1 tablet (10 mg total) by mouth every evening. 90 tablet 1   No current facility-administered medications for this visit.    PHYSICAL EXAMINATION: ECOG PERFORMANCE STATUS: 1 - Symptomatic but completely ambulatory  Vitals:   10/01/19 1456  BP: (!) 144/73  Pulse: 77  Resp: 18  Temp: 98.3 F (36.8 C)  SpO2: 100%   Filed Weights   10/01/19 1456  Weight: 167 lb 6.4 oz (75.9 kg)    GENERAL: alert, no distress and comfortable SKIN: skin color, texture, turgor are normal, no rashes or significant  lesions EYES: normal, Conjunctiva are pink and non-injected, sclera clear OROPHARYNX: no exudate, no erythema and lips, buccal mucosa, and tongue normal  NECK: supple, thyroid normal size, non-tender, without nodularity LYMPH: no palpable lymphadenopathy in the cervical, axillary or inguinal LUNGS: clear to auscultation and percussion with normal breathing effort HEART: regular rate & rhythm and no murmurs and no lower extremity edema ABDOMEN: abdomen soft, non-tender and normal bowel sounds MUSCULOSKELETAL: no cyanosis of digits and no clubbing  NEURO: alert & oriented x 3 with fluent speech, no focal motor/sensory deficits EXTREMITIES: No lower extremity  edema  LABORATORY DATA:  I have reviewed the data as listed CMP Latest Ref Rng & Units 10/01/2019 08/21/2019 07/31/2019  Glucose 70 - 99 mg/dL 98 104(H) 102(H)  BUN 8 - 23 mg/dL _0 Creatinine 0.44 - 1.00 mg/dL 1.00 0.81 0.76  Sodium 135 - 145 mmol/L 141 140 140  Potassium 3.5 - 5.1 mmol/L 3.4(L) 3.4(L) 3.7  Chloride 98 - 111 mmol/L 101 103 103  CO2 22 - 32 mmol/L _1 Calcium 8.9 - 10.3 mg/dL 9.4 9.7 10.5(H)  Total Protein 6.5 - 8.1 g/dL 7.4 7.0 7.1  Total Bilirubin 0.3 - 1.2 mg/dL 0.4 0.4 0.3  Alkaline Phos 38 - 126 U/L 59 63 59  AST 15 - 41 U/L _2 ALT 0 - 44 U/L _3 Lab Results  Component Value Date   WBC 5.0 10/01/2019   HGB 11.7 (L) 10/01/2019   HCT 35.4 (L) 10/01/2019   MCV 94.1 10/01/2019   PLT 232 10/01/2019   NEUTROABS 3.5 10/01/2019    ASSESSMENT & PLAN:  Carcinoma of upper-inner quadrant of right breast in female, estrogen receptor positive ([place]) 04/02/2019:Screening mammogram detected a mass in the right axilla. [place] confirmed a 0.9cm right breast mass. Biopsy showed IDC, grade 2, HER-2+ (3+), ER+ 100%, PR+ 5%, Ki67 15%.  Treatment plan: 1.Breast conserving surgery with sentinel lymph node biopsy 2.Adjuvant chemotherapy with Taxol Herceptin weekly x12 followed by Herceptin maintenance for 1 year started8/14/2020 3.Adjuvant radiation therapy will be starting 08/22/2019-09/21/2019 4.Followed by adjuvant antiestrogen therapy with anastrozole 1 mg daily x5 to 7 years ------------------------------------------------------------------------------------------------------------------------------------------- Current Treatment: Herceptin maintenance therapy We will start anastrozole 1 mg daily as well.  Anastrozole counseling:We discussed the risks and benefits of anti-estrogen therapy with aromatase inhibitors. These include but not limited to insomnia, hot flashes, mood changes, vaginal dryness, bone density loss, and weight gain. We  strongly believe that the benefits far outweigh the risks. Patient understands these risks and consented to starting treatment. Planned treatment duration is 7 years.  Return to clinic every 3 weeks for Herceptin every 6 weeks of follow-up with me.    No orders of the defined types were placed in this encounter.  The patient has a good understanding of the overall plan. she agrees with it. she will call with any problems that may develop before the next visit here.  Nicholas Lose, MD 10/01/2019  Julious Oka Dorshimer, am acting as scribe for Dr. Nicholas Lose.  I have reviewed the above document for accuracy and completeness, and I agree with the above.

## 2019-10-01 ENCOUNTER — Other Ambulatory Visit: Payer: Self-pay

## 2019-10-01 ENCOUNTER — Inpatient Hospital Stay: Payer: 59

## 2019-10-01 ENCOUNTER — Inpatient Hospital Stay (HOSPITAL_BASED_OUTPATIENT_CLINIC_OR_DEPARTMENT_OTHER): Payer: 59 | Admitting: Hematology and Oncology

## 2019-10-01 ENCOUNTER — Other Ambulatory Visit: Payer: Self-pay | Admitting: Hematology and Oncology

## 2019-10-01 VITALS — BP 144/73 | HR 77 | Temp 98.3°F | Resp 18 | Ht 66.5 in | Wt 167.4 lb

## 2019-10-01 DIAGNOSIS — Z170 Estrogen receptor positive status [ER+]: Secondary | ICD-10-CM

## 2019-10-01 DIAGNOSIS — C50211 Malignant neoplasm of upper-inner quadrant of right female breast: Secondary | ICD-10-CM

## 2019-10-01 DIAGNOSIS — Z780 Asymptomatic menopausal state: Secondary | ICD-10-CM | POA: Diagnosis not present

## 2019-10-01 DIAGNOSIS — Z95828 Presence of other vascular implants and grafts: Secondary | ICD-10-CM

## 2019-10-01 DIAGNOSIS — C50411 Malignant neoplasm of upper-outer quadrant of right female breast: Secondary | ICD-10-CM | POA: Diagnosis not present

## 2019-10-01 LAB — CBC WITH DIFFERENTIAL (CANCER CENTER ONLY)
Abs Immature Granulocytes: 0.01 10*3/uL (ref 0.00–0.07)
Basophils Absolute: 0 10*3/uL (ref 0.0–0.1)
Basophils Relative: 1 %
Eosinophils Absolute: 0.1 10*3/uL (ref 0.0–0.5)
Eosinophils Relative: 2 %
HCT: 35.4 % — ABNORMAL LOW (ref 36.0–46.0)
Hemoglobin: 11.7 g/dL — ABNORMAL LOW (ref 12.0–15.0)
Immature Granulocytes: 0 %
Lymphocytes Relative: 20 %
Lymphs Abs: 1 10*3/uL (ref 0.7–4.0)
MCH: 31.1 pg (ref 26.0–34.0)
MCHC: 33.1 g/dL (ref 30.0–36.0)
MCV: 94.1 fL (ref 80.0–100.0)
Monocytes Absolute: 0.3 10*3/uL (ref 0.1–1.0)
Monocytes Relative: 7 %
Neutro Abs: 3.5 10*3/uL (ref 1.7–7.7)
Neutrophils Relative %: 70 %
Platelet Count: 232 10*3/uL (ref 150–400)
RBC: 3.76 MIL/uL — ABNORMAL LOW (ref 3.87–5.11)
RDW: 12.9 % (ref 11.5–15.5)
WBC Count: 5 10*3/uL (ref 4.0–10.5)
nRBC: 0 % (ref 0.0–0.2)

## 2019-10-01 LAB — CMP (CANCER CENTER ONLY)
ALT: 18 U/L (ref 0–44)
AST: 23 U/L (ref 15–41)
Albumin: 4 g/dL (ref 3.5–5.0)
Alkaline Phosphatase: 59 U/L (ref 38–126)
Anion gap: 11 (ref 5–15)
BUN: 16 mg/dL (ref 8–23)
CO2: 29 mmol/L (ref 22–32)
Calcium: 9.4 mg/dL (ref 8.9–10.3)
Chloride: 101 mmol/L (ref 98–111)
Creatinine: 1 mg/dL (ref 0.44–1.00)
GFR, Est AFR Am: >60 mL/min (ref >60)
GFR, Estimated: 59 mL/min — ABNORMAL LOW (ref >60)
Glucose, Bld: 98 mg/dL (ref 70–99)
Potassium: 3.4 mmol/L — ABNORMAL LOW (ref 3.5–5.1)
Sodium: 141 mmol/L (ref 135–145)
Total Bilirubin: 0.4 mg/dL (ref 0.3–1.2)
Total Protein: 7.4 g/dL (ref 6.5–8.1)

## 2019-10-01 MED ORDER — ACETAMINOPHEN 325 MG PO TABS
ORAL_TABLET | ORAL | Status: AC
  Filled 2019-10-01: qty 2

## 2019-10-01 MED ORDER — SODIUM CHLORIDE 0.9% FLUSH
10.0000 mL | INTRAVENOUS | Status: DC | PRN
  Administered 2019-10-01: 10 mL
  Filled 2019-10-01: qty 10

## 2019-10-01 MED ORDER — HEPARIN SOD (PORK) LOCK FLUSH 100 UNIT/ML IV SOLN
500.0000 [IU] | Freq: Once | INTRAVENOUS | Status: AC | PRN
  Administered 2019-10-01: 500 [IU]
  Filled 2019-10-01: qty 5

## 2019-10-01 MED ORDER — SODIUM CHLORIDE 0.9 % IV SOLN
Freq: Once | INTRAVENOUS | Status: AC
  Filled 2019-10-01: qty 250

## 2019-10-01 MED ORDER — DIPHENHYDRAMINE HCL 25 MG PO CAPS
50.0000 mg | ORAL_CAPSULE | Freq: Once | ORAL | Status: AC
  Administered 2019-10-01: 50 mg via ORAL

## 2019-10-01 MED ORDER — TRASTUZUMAB-ANNS CHEMO 150 MG IV SOLR
450.0000 mg | Freq: Once | INTRAVENOUS | Status: AC
  Administered 2019-10-01: 450 mg via INTRAVENOUS
  Filled 2019-10-01: qty 21.43

## 2019-10-01 MED ORDER — DIPHENHYDRAMINE HCL 25 MG PO CAPS
ORAL_CAPSULE | ORAL | Status: AC
  Filled 2019-10-01: qty 2

## 2019-10-01 MED ORDER — ACETAMINOPHEN 325 MG PO TABS
650.0000 mg | ORAL_TABLET | Freq: Once | ORAL | Status: AC
  Administered 2019-10-01: 650 mg via ORAL

## 2019-10-01 MED ORDER — ANASTROZOLE 1 MG PO TABS: 1.0000 mg | ORAL_TABLET | Freq: Every day | ORAL | 3 refills | Status: DC

## 2019-10-01 NOTE — Assessment & Plan Note (Signed)
04/02/2019:Screening mammogram detected a mass in the right axilla. [place] confirmed a 0.9cm right breast mass. Biopsy showed IDC, grade 2, HER-2+ (3+), ER+ 100%, PR+ 5%, Ki67 15%.  Treatment plan: 1.Breast conserving surgery with sentinel lymph node biopsy 2.Adjuvant chemotherapy with Taxol Herceptin weekly x12 followed by Herceptin maintenance for 1 year started8/14/2020 3.Adjuvant radiation therapy will be starting 08/22/2019-09/21/2019 4.Followed by adjuvant antiestrogen therapy with anastrozole 1 mg daily x5 to 7 years ------------------------------------------------------------------------------------------------------------------------------------------- Current Treatment: Herceptin maintenance therapy We will start anastrozole 1 mg daily as well.  Return to clinic every 3 weeks for Herceptin every 6 weeks of follow-up with me.

## 2019-10-01 NOTE — Patient Instructions (Signed)

## 2019-10-01 NOTE — Patient Instructions (Signed)
[facility] Discharge Instructions for Patients Receiving Chemotherapy  Today you received the following chemotherapy agents trastuzumab.  To help prevent nausea and vomiting after your treatment, we encourage you to take your nausea medication as directed.    If you develop nausea and vomiting that is not controlled by your nausea medication, call the clinic.   BELOW ARE SYMPTOMS THAT SHOULD BE REPORTED IMMEDIATELY:  *FEVER GREATER THAN 100.5 F  *CHILLS WITH OR WITHOUT FEVER  NAUSEA AND VOMITING THAT IS NOT CONTROLLED WITH YOUR NAUSEA MEDICATION  *UNUSUAL SHORTNESS OF BREATH  *UNUSUAL BRUISING OR BLEEDING  TENDERNESS IN MOUTH AND THROAT WITH OR WITHOUT PRESENCE OF ULCERS  *URINARY PROBLEMS  *BOWEL PROBLEMS  UNUSUAL RASH Items with * indicate a potential emergency and should be followed up as soon as possible.  Feel free to call the clinic should you have any questions or concerns. The clinic phone number is [phone].  Please show the CHEMO ALERT CARD at check-in to the Emergency Department and triage nurse.   

## 2019-10-22 ENCOUNTER — Ambulatory Visit: Payer: 59 | Admitting: Nurse Practitioner

## 2019-10-22 ENCOUNTER — Other Ambulatory Visit: Payer: Self-pay

## 2019-10-22 ENCOUNTER — Encounter: Payer: Self-pay | Admitting: Nurse Practitioner

## 2019-10-22 VITALS — BP 122/80 | HR 67 | Temp 98.9°F | Ht 63.4 in | Wt 167.2 lb

## 2019-10-22 DIAGNOSIS — C50211 Malignant neoplasm of upper-inner quadrant of right female breast: Secondary | ICD-10-CM | POA: Diagnosis not present

## 2019-10-22 DIAGNOSIS — R7303 Prediabetes: Secondary | ICD-10-CM

## 2019-10-22 DIAGNOSIS — E7800 Pure hypercholesterolemia, unspecified: Secondary | ICD-10-CM

## 2019-10-22 DIAGNOSIS — I10 Essential (primary) hypertension: Secondary | ICD-10-CM

## 2019-10-22 DIAGNOSIS — Z170 Estrogen receptor positive status [ER+]: Secondary | ICD-10-CM

## 2019-10-22 MED ORDER — SIMVASTATIN 10 MG PO TABS: 10.0000 mg | ORAL_TABLET | Freq: Every evening | ORAL | 1 refills | Status: DC

## 2019-10-22 MED ORDER — METOPROLOL TARTRATE 50 MG PO TABS: 50.0000 mg | ORAL_TABLET | Freq: Two times a day (BID) | ORAL | 1 refills | Status: DC

## 2019-10-22 MED ORDER — HYDROCHLOROTHIAZIDE 25 MG PO TABS: 25.0000 mg | ORAL_TABLET | Freq: Every day | ORAL | 1 refills | Status: DC

## 2019-10-22 NOTE — Progress Notes (Signed)
This visit occurred during the SARS-CoV-2 public health emergency.  Safety protocols were in place, including screening questions prior to the visit, additional usage of staff PPE, and extensive cleaning of exam room while observing appropriate contact time as indicated for disinfecting solutions.  Subjective:     Patient ID: Emily Ross , female    DOB: 1955-11-06 , 64 y.o.   MRN: [id number]   Chief Complaint  Patient presents with  . Hypertension    HPI  She has finished chemo and radiation, she is doing herceptin every 3 weeks until June.  She had a lumpectomy  Hypertension This is a chronic problem. The current episode started more than 1 year ago. The problem is unchanged. The problem is controlled. Pertinent negatives include no anxiety, headaches or shortness of breath.     Past Medical History:  Diagnosis Date  . Breast cancer ([place])   . Cancer [facility])    2009 left breast 2020 right breast  . Family history of breast cancer   . Family history of stomach cancer   . High cholesterol   . History of seasonal allergies   . Hypertension   . Pneumonia   . Wears glasses      Family History  Problem Relation Age of Onset  . Diabetes Father   . Hypertension Father   . Stomach cancer Father 68  . Diabetes Mother   . Hypertension Mother   . Breast cancer Sister 33       negative genetic testing  . Breast cancer Maternal Aunt 80  . Colon cancer Neg Hx   . Esophageal cancer Neg Hx   . Rectal cancer Neg Hx      Current Outpatient Medications:  .  anastrozole (ARIMIDEX) 1 MG tablet, Take 1 tablet (1 mg total) by mouth daily., Disp: 90 tablet, Rfl: 3 .  carboxymethylcellul-glycerin (LUBRICATING EYE DROPS) 0.5-0.9 % ophthalmic solution, Place 1 drop into both eyes as needed (dry eyes)., Disp: 30 mL, Rfl: 1 .  Cholecalciferol (DIALYVITE VITAMIN D 5000) 125 MCG (5000 UT) capsule, Take 5,000 Units by mouth daily., Disp: , Rfl:  .  diphenhydrAMINE (BENADRYL) 25 MG tablet,  Take 25 mg by mouth daily as needed for allergies., Disp: , Rfl:  .  hydrochlorothiazide (HYDRODIURIL) 25 MG tablet, Take 1 tablet (25 mg total) by mouth daily., Disp: 90 tablet, Rfl: 1 .  ibuprofen (ADVIL) 600 MG tablet, Take 1 tablet (600 mg total) by mouth every 6 (six) hours as needed., Disp: 30 tablet, Rfl: 2 .  lidocaine-prilocaine (EMLA) cream, Apply to affected area once, Disp: 30 g, Rfl: 3 .  metoprolol tartrate (LOPRESSOR) 50 MG tablet, Take 1 tablet (50 mg total) by mouth 2 (two) times daily., Disp: 180 tablet, Rfl: 1 .  Red Yeast Rice Extract (RED YEAST RICE PO), Take 2 capsules by mouth daily., Disp: , Rfl:  .  simvastatin (ZOCOR) 10 MG tablet, Take 1 tablet (10 mg total) by mouth every evening., Disp: 90 tablet, Rfl: 1   No Known Allergies   Review of Systems  Respiratory: Negative for shortness of breath.   Neurological: Negative for headaches.     Today's Vitals   10/22/19 1420  BP: 122/80  Pulse: 67  Temp: 98.9 F (37.2 C)  TempSrc: Oral  Weight: 167 lb 3.2 oz (75.8 kg)  Height: 5' 3.4" (1.61 m)  PainSc: 2    Body mass index is 29.25 kg/m.   Objective:  Physical Exam Vitals and nursing note  reviewed.  Constitutional:      General: She is not in acute distress.    Appearance: Normal appearance. She is obese. She is not toxic-appearing.  HENT:     Mouth/Throat:     Pharynx: No oropharyngeal exudate or posterior oropharyngeal erythema.  Eyes:     General: No scleral icterus.       Right eye: No discharge.        Left eye: No discharge.     Conjunctiva/sclera: Conjunctivae normal.  Cardiovascular:     Rate and Rhythm: Normal rate and regular rhythm.     Pulses: Normal pulses.     Heart sounds: Normal heart sounds. No murmur.  Pulmonary:     Effort: Pulmonary effort is normal. No respiratory distress.     Breath sounds: Normal breath sounds. No wheezing or rhonchi.  Musculoskeletal:        General: Normal range of motion.     Cervical back: Neck supple.  No rigidity.  Lymphadenopathy:     Cervical: No cervical adenopathy.  Skin:    General: Skin is warm and dry.     Capillary Refill: Capillary refill takes less than 2 seconds.     Coloration: Skin is not jaundiced.     Findings: No rash.     Comments: portacath to right chest, has some darkened skin to right breast where she has had radiation.  Neurological:     General: No focal deficit present.     Mental Status: She is alert and oriented to person, place, and time.     Cranial Nerves: No cranial nerve deficit.     Gait: Gait normal.  Psychiatric:        Mood and Affect: Mood normal.        Behavior: Behavior normal.        Thought Content: Thought content normal.        Judgment: Judgment normal.         Assessment And Plan:     1. Essential hypertension . B/P is controlled.  . CMP done at cancer center last month kidney functions were stable, she is due for labs again on 1/12 . The importance of regular exercise and dietary modification was stressed to the patient.  - metoprolol tartrate (LOPRESSOR) 50 MG tablet; Take 1 tablet (50 mg total) by mouth 2 (two) times daily.  Dispense: 180 tablet; Refill: 1 - hydrochlorothiazide (HYDRODIURIL) 25 MG tablet; Take 1 tablet (25 mg total) by mouth daily.  Dispense: 90 tablet; Refill: 1  2. Elevated LDL cholesterol level  Will check lipid panel with her labs when she goes to the cancer center, requisition given  No issues with her medications for cholesterol - simvastatin (ZOCOR) 10 MG tablet; Take 1 tablet (10 mg total) by mouth every evening.  Dispense: 90 tablet; Refill: 1 - Lipid panel  3. Carcinoma of upper-inner quadrant of right breast in female, estrogen receptor positive ([place])  Continue follow up with [facility]  On Herceptin until June 2021  4. Prediabetes  Chronic, controlled  No current medications  Encouraged to limit intake of sugary foods and drinks  Continue physical activity of at least 150 minutes  per day - Hemoglobin A1c   Minette Brine, FNP    THE PATIENT IS ENCOURAGED TO PRACTICE SOCIAL DISTANCING DUE TO THE COVID-19 PANDEMIC.

## 2019-10-23 ENCOUNTER — Inpatient Hospital Stay: Payer: 59 | Attending: Hematology and Oncology

## 2019-10-23 ENCOUNTER — Other Ambulatory Visit: Payer: Self-pay

## 2019-10-23 ENCOUNTER — Encounter: Payer: Self-pay | Admitting: *Deleted

## 2019-10-23 VITALS — BP 128/81 | HR 64 | Temp 98.2°F

## 2019-10-23 DIAGNOSIS — Z5111 Encounter for antineoplastic chemotherapy: Secondary | ICD-10-CM | POA: Insufficient documentation

## 2019-10-23 DIAGNOSIS — Z79899 Other long term (current) drug therapy: Secondary | ICD-10-CM | POA: Insufficient documentation

## 2019-10-23 DIAGNOSIS — D72819 Decreased white blood cell count, unspecified: Secondary | ICD-10-CM | POA: Insufficient documentation

## 2019-10-23 DIAGNOSIS — D6481 Anemia due to antineoplastic chemotherapy: Secondary | ICD-10-CM | POA: Insufficient documentation

## 2019-10-23 DIAGNOSIS — C50411 Malignant neoplasm of upper-outer quadrant of right female breast: Secondary | ICD-10-CM | POA: Insufficient documentation

## 2019-10-23 DIAGNOSIS — G629 Polyneuropathy, unspecified: Secondary | ICD-10-CM | POA: Diagnosis not present

## 2019-10-23 DIAGNOSIS — Z170 Estrogen receptor positive status [ER+]: Secondary | ICD-10-CM | POA: Diagnosis not present

## 2019-10-23 DIAGNOSIS — C50211 Malignant neoplasm of upper-inner quadrant of right female breast: Secondary | ICD-10-CM

## 2019-10-23 MED ORDER — HEPARIN SOD (PORK) LOCK FLUSH 100 UNIT/ML IV SOLN
500.0000 [IU] | Freq: Once | INTRAVENOUS | Status: AC | PRN
  Administered 2019-10-23: 500 [IU]
  Filled 2019-10-23: qty 5

## 2019-10-23 MED ORDER — SODIUM CHLORIDE 0.9% FLUSH
10.0000 mL | INTRAVENOUS | Status: DC | PRN
  Administered 2019-10-23: 10 mL
  Filled 2019-10-23: qty 10

## 2019-10-23 MED ORDER — DIPHENHYDRAMINE HCL 25 MG PO CAPS
ORAL_CAPSULE | ORAL | Status: AC
  Filled 2019-10-23: qty 2

## 2019-10-23 MED ORDER — DIPHENHYDRAMINE HCL 25 MG PO CAPS
50.0000 mg | ORAL_CAPSULE | Freq: Once | ORAL | Status: AC
  Administered 2019-10-23: 50 mg via ORAL

## 2019-10-23 MED ORDER — ACETAMINOPHEN 325 MG PO TABS
ORAL_TABLET | ORAL | Status: AC
  Filled 2019-10-23: qty 2

## 2019-10-23 MED ORDER — TRASTUZUMAB-ANNS CHEMO 150 MG IV SOLR
450.0000 mg | Freq: Once | INTRAVENOUS | Status: AC
  Administered 2019-10-23: 450 mg via INTRAVENOUS
  Filled 2019-10-23: qty 21.43

## 2019-10-23 MED ORDER — ACETAMINOPHEN 325 MG PO TABS
650.0000 mg | ORAL_TABLET | Freq: Once | ORAL | Status: AC
  Administered 2019-10-23: 650 mg via ORAL

## 2019-10-23 MED ORDER — SODIUM CHLORIDE 0.9 % IV SOLN
Freq: Once | INTRAVENOUS | Status: AC
  Filled 2019-10-23: qty 250

## 2019-10-23 NOTE — Patient Instructions (Signed)
[facility] Discharge Instructions for Patients Receiving Chemotherapy  Today you received the following chemotherapy agents Trastuzumab-anns  To help prevent nausea and vomiting after your treatment, we encourage you to take your nausea medication as directed.   If you develop nausea and vomiting that is not controlled by your nausea medication, call the clinic.   BELOW ARE SYMPTOMS THAT SHOULD BE REPORTED IMMEDIATELY:  *FEVER GREATER THAN 100.5 F  *CHILLS WITH OR WITHOUT FEVER  NAUSEA AND VOMITING THAT IS NOT CONTROLLED WITH YOUR NAUSEA MEDICATION  *UNUSUAL SHORTNESS OF BREATH  *UNUSUAL BRUISING OR BLEEDING  TENDERNESS IN MOUTH AND THROAT WITH OR WITHOUT PRESENCE OF ULCERS  *URINARY PROBLEMS  *BOWEL PROBLEMS  UNUSUAL RASH Items with * indicate a potential emergency and should be followed up as soon as possible.  Feel free to call the clinic should you have any questions or concerns. The clinic phone number is [phone].  Please show the CHEMO ALERT CARD at check-in to the Emergency Department and triage nurse.   

## 2019-10-23 NOTE — Progress Notes (Signed)
10/23/19 at 9:34am - Research - UpBeat study notes- The pt was in the clinic for her routine cancer treatment today.  The research nurse met briefly with the pt to inform her that her month 3 cardiac MRI that was performed in November 2020 was "negative for alert findings".  The pt said she was so glad to hear the good news.  The research nurse informed the pt that we received the results on 10/12/19, and Dr. Lindi Adie reviewed the MRI clinical report.  The pt was informed that her next on-study appt will be in August 2021.  The pt was thanked for her continued support in this clinical trial.  The pt was told that her nurse will contact her in July to set up an appointment for her month 12 UpBeat visit.  The pt verbalized understanding.   Brion Aliment RN, BSN, CCRP Clinical Research Nurse 10/23/2019 9:37 AM

## 2019-10-24 NOTE — Progress Notes (Signed)
  Patient Name: Emily Ross MRN: [id number] DOB: 04/11/1956 Referring Physician: Nicholas Lose (Profile Not Attached) Date of Service: 09/21/2019 [facility], [place]                                                        End Of Treatment Note  Diagnoses: C50.411-Malignant neoplasm of upper-outer quadrant of right female breast  Cancer Staging: Cancer Staging Carcinoma of upper-inner quadrant of right breast in female, estrogen receptor positive ([place]) Staging form: Breast, AJCC 8th Edition - Clinical stage from 04/08/2019: Stage IA (cT1b, cN0, cM0, G2, ER+, PR+, HER2+) - Signed by Eppie [place], MD on 04/08/2019  Malignant neoplasm of upper-outer quadrant of right breast in female, estrogen receptor positive ([place]) Staging form: Breast, AJCC 8th Edition - Clinical stage from 04/08/2019: Stage IA (cT1b, cN0, cM0, G2, ER+, PR+, HER2+) - Signed by Nicholas Lose, MD on 04/08/2019   Right breast UIQ pT1c, pN0 cancer  Intent: Curative  Radiation Treatment Dates: 08/23/2019 through 09/21/2019 Site Technique Total Dose (Gy) Dose per Fx (Gy) Completed Fx Beam Energies  Breast, Right: Breast_Rt 3D 40.05/40.05 2.67 15/15 6X, 10X  Breast, Right: Breast_Rt_Bst specialPort 10/10 2 5/5 9E   Narrative: The patient tolerated radiation therapy relatively well.   Plan: The patient will follow-up with radiation oncology in 63mo or as needed.  -----------------------------------  SEppie Gibson MD

## 2019-10-28 ENCOUNTER — Encounter: Payer: Self-pay | Admitting: Radiation Oncology

## 2019-10-29 NOTE — Progress Notes (Signed)
I called the patient today about her upcoming follow-up appointment in radiation oncology.   Given the state of the COVID-19 pandemic, concerning case numbers in our community, and guidance from [facility], I offered a phone assessment with the patient to determine if coming to the clinic was necessary. She accepted.  I let the patient know that I had spoken with Dr. Isidore Moos, and she wanted them to know the importance of washing their hands for at least 20 seconds at a time, especially after going out in public, and before they eat.  Limit going out in public whenever possible. Do not touch your face, unless your hands are clean, such as when bathing. Get plenty of rest, eat well, and stay hydrated.   The patient denies any symptomatic concerns.  Specifically, they report good healing of their skin in the radiation fields.  Skin is intact.    I recommended that she continue skin care by applying oil or lotion with vitamin E to the skin in the radiation fields, BID, for 2 more months.  Continue follow-up with medical oncology - follow-up is scheduled on 11/13/19 with Dr. Lindi Adie and infusion.  I explained that yearly mammograms are important for patients with intact breast tissue, and physical exams are important after mastectomy for patients that cannot undergo mammography.  I encouraged her to call if she had further questions or concerns about her healing. Otherwise, she will follow-up PRN in radiation oncology. Patient is pleased with this plan, and we will cancel her upcoming follow-up to reduce the risk of COVID-19 transmission.

## 2019-10-30 ENCOUNTER — Ambulatory Visit
Admission: RE | Admit: 2019-10-30 | Discharge: 2019-10-30 | Disposition: A | Discharge status: Home / Self Care | Payer: 59 | Source: Ambulatory Visit | Attending: Radiation Oncology | Admitting: Radiation Oncology

## 2019-10-30 HISTORY — DX: Personal history of irradiation: Z92.3

## 2019-11-03 ENCOUNTER — Telehealth: Payer: Self-pay | Admitting: Hematology and Oncology

## 2019-11-03 NOTE — Telephone Encounter (Signed)
Rescheduled per provider. Called and spoke with pt, confirmed 2/9 appt

## 2019-11-09 NOTE — Progress Notes (Signed)
Patient Care Team: Minette Brine, [place] as PCP - General (General Practice) Mauro Kaufmann, RN as Oncology Nurse Navigator Rockwell Germany, RN as Oncology Nurse Navigator Nicholas Lose, MD as Consulting Physician (Hematology and Oncology) Rolm Bookbinder, MD as Consulting Physician (General Surgery) Eppie Gibson, MD as Attending Physician (Radiation Oncology)  DIAGNOSIS:    ICD-10-CM   1. Carcinoma of upper-inner quadrant of right breast in female, estrogen receptor positive ([place])  C50.211    Z17.0     SUMMARY OF ONCOLOGIC HISTORY: Oncology History  Malignant neoplasm of upper-outer quadrant of right breast in female, estrogen receptor positive ([place])  04/02/2019 Initial Diagnosis   Screening mammogram detected a mass in the right axilla. [place] confirmed a 0.9cm right breast mass. Biopsy showed IDC, grade 2, HER-2+ (3+), ER+ 100%, PR+ 5%, Ki67 15%.    04/08/2019 Cancer Staging   Staging form: Breast, AJCC 8th Edition - Clinical stage from 04/08/2019: Stage IA (cT1b, cN0, cM0, G2, ER+, PR+, HER2+) - Signed by Nicholas Lose, MD on 04/08/2019   04/14/2019 Genetic Testing   Patient had genetic testing done for her personal and family history of breast cancer.  Negative genetic testing on the [facility] Breast Cancer STAT panel. The STAT Breast cancer panel offered by [facility] includes sequencing and rearrangement analysis for the following 9 genes:  ATM, BRCA1, BRCA2, CDH1, CHEK2, PALB2, PTEN, STK11 and TP53.  The report date is 04/14/2019.   04/17/2019 Surgery   Right lumpectomy [name]): IDC, 2.0cm, grade 2, clear margins, 5 axillary lymph nodes negative for carcinoma   08/24/2019 - 09/21/2019 Radiation Therapy   Adjuvant radiation   10/01/2019 -  Anti-estrogen oral therapy   Anastrozole daily   Carcinoma of upper-inner quadrant of right breast in female, estrogen receptor positive ([place])  04/08/2019 Cancer Staging   Staging form: Breast, AJCC 8th Edition - Clinical stage from 04/08/2019:  Stage IA (cT1b, cN0, cM0, G2, ER+, PR+, HER2+) - Signed by Eppie Gibson, MD on 04/08/2019   05/15/2019 -  Chemotherapy   The patient had PACLitaxel (TAXOL) 144 mg in sodium chloride 0.9 % 250 mL chemo infusion (</= 77m/m2), 80 mg/m2 = 144 mg, Intravenous,  Once, 3 of 3 cycles Administration: 144 mg (05/15/2019), 144 mg (05/22/2019), 144 mg (06/12/2019), 144 mg (05/29/2019), 144 mg (06/05/2019), 144 mg (06/19/2019), 144 mg (06/26/2019), 144 mg (07/03/2019), 144 mg (07/10/2019), 144 mg (07/17/2019), 144 mg (07/24/2019), 144 mg (07/31/2019) trastuzumab-anns (KANJINTI) 300 mg in sodium chloride 0.9 % 250 mL chemo infusion, 294 mg (100 % of original dose 4 mg/kg), Intravenous,  Once, 7 of 16 cycles Dose modification: 4 mg/kg (original dose 4 mg/kg, Cycle 1, Reason: Other (see comments), Comment: load), 6 mg/kg (original dose 2 mg/kg, Cycle 3, Reason: Other (see comments), Comment: q3wk dosing), 2 mg/kg (original dose 2 mg/kg, Cycle 3, Reason: Other (see comments)), 450 mg (original dose 2 mg/kg, Cycle 3, Reason: Other (see comments), Comment: Maintenance dosing), 450 mg (original dose 6 mg/kg, Cycle 4, Reason: Other (see comments), Comment: rounded; vial size) Administration: 300 mg (05/15/2019), 150 mg (05/22/2019), 150 mg (05/29/2019), 150 mg (06/05/2019), 150 mg (06/12/2019), 150 mg (06/19/2019), 150 mg (06/26/2019), 150 mg (07/03/2019), 150 mg (07/10/2019), 150 mg (07/17/2019), 150 mg (07/24/2019), 450 mg (07/31/2019), 450 mg (08/21/2019), 450 mg (09/11/2019), 450 mg (10/01/2019), 450 mg (10/23/2019)  for chemotherapy treatment.      CHIEF COMPLIANT: Follow-up of right breast cancer on Herceptin and anastrozole  INTERVAL HISTORY: Emily KALKAis a 64y.o. with above-mentioned history  of right breast cancerwho underwent a lumpectomy,adjuvant chemotherapy, and radiation.She is a participant in the UpBeat clinical trial.She is currently on treatment with Herceptin and anastrozole. She presents to the clinic today for  treatment.   ALLERGIES:  has No Known Allergies.  MEDICATIONS:  Current Outpatient Medications  Medication Sig Dispense Refill  . anastrozole (ARIMIDEX) 1 MG tablet Take 1 tablet (1 mg total) by mouth daily. 90 tablet 3  . carboxymethylcellul-glycerin (LUBRICATING EYE DROPS) 0.5-0.9 % ophthalmic solution Place 1 drop into both eyes as needed (dry eyes). 30 mL 1  . Cholecalciferol (DIALYVITE VITAMIN D 5000) 125 MCG (5000 UT) capsule Take 5,000 Units by mouth daily.    . diphenhydrAMINE (BENADRYL) 25 MG tablet Take 25 mg by mouth daily as needed for allergies.    . hydrochlorothiazide (HYDRODIURIL) 25 MG tablet Take 1 tablet (25 mg total) by mouth daily. 90 tablet 1  . ibuprofen (ADVIL) 600 MG tablet Take 1 tablet (600 mg total) by mouth every 6 (six) hours as needed. 30 tablet 2  . lidocaine-prilocaine (EMLA) cream Apply to affected area once 30 g 3  . metoprolol tartrate (LOPRESSOR) 50 MG tablet Take 1 tablet (50 mg total) by mouth 2 (two) times daily. 180 tablet 1  . Red Yeast Rice Extract (RED YEAST RICE PO) Take 2 capsules by mouth daily.    . simvastatin (ZOCOR) 10 MG tablet Take 1 tablet (10 mg total) by mouth every evening. 90 tablet 1   No current facility-administered medications for this visit.    PHYSICAL EXAMINATION: ECOG PERFORMANCE STATUS: 1 - Symptomatic but completely ambulatory  Vitals:   11/10/19 1459  BP: (!) 146/81  Pulse: 70  Resp: 18  Temp: 98.2 F (36.8 C)  SpO2: 100%   Filed Weights   11/10/19 1459  Weight: 168 lb 14.4 oz (76.6 kg)    LABORATORY DATA:  I have reviewed the data as listed CMP Latest Ref Rng & Units 11/10/2019 10/01/2019 08/21/2019  Glucose 70 - 99 mg/dL 97 98 104(H)  BUN 8 - 23 mg/dL _0 Creatinine 0.44 - 1.00 mg/dL 0.83 1.00 0.81  Sodium 135 - 145 mmol/L 139 141 140  Potassium 3.5 - 5.1 mmol/L 3.4(L) 3.4(L) 3.4(L)  Chloride 98 - 111 mmol/L 101 101 103  CO2 22 - 32 mmol/L 32 29 30  Calcium 8.9 - 10.3 mg/dL 10.1 9.4 9.7  Total  Protein 6.5 - 8.1 g/dL 7.5 7.4 7.0  Total Bilirubin 0.3 - 1.2 mg/dL 0.4 0.4 0.4  Alkaline Phos 38 - 126 U/L 60 59 63  AST 15 - 41 U/L _1 ALT 0 - 44 U/L _2 Lab Results  Component Value Date   WBC 3.3 (L) 11/10/2019   HGB 11.7 (L) 11/10/2019   HCT 35.5 (L) 11/10/2019   MCV 91.0 11/10/2019   PLT 257 11/10/2019   NEUTROABS 1.5 (L) 11/10/2019    ASSESSMENT & PLAN:  Carcinoma of upper-inner quadrant of right breast in female, estrogen receptor positive ([place]) 04/02/2019:Screening mammogram detected a mass in the right axilla. [place] confirmed a 0.9cm right breast mass. Biopsy showed IDC, grade 2, HER-2+ (3+), ER+ 100%, PR+ 5%, Ki67 15%.  Treatment plan: 1.Breast conserving surgery with sentinel lymph node biopsy 2.Adjuvant chemotherapy with Taxol Herceptin weekly x12 followed by Herceptin maintenance for 1 year started8/14/2020 3.Adjuvant radiation therapywill be starting 08/22/2019-09/21/2019 4.Followed by adjuvant antiestrogen therapy with anastrozole 1 mg daily x5 to 7 years started 11/09/2018 -------------------------------------------------------------------------------------------------------------------------------------------  Current Treatment:Herceptin maintenance therapy with anastrozole No adverse effects to Herceptin Anastrozole: She will start this 11/10/2019 Monitoring closely for toxicities Return to clinic every 3 weeks for Herceptin every 6 weeks of follow-up with me.    No orders of the defined types were placed in this encounter.  The patient has a good understanding of the overall plan. she agrees with it. she will call with any problems that may develop before the next visit here.  Total time spent: 30 mins including face to face time and time spent for planning, charting and coordination of care  Nicholas Lose, MD 11/10/2019  I, Cloyde Reams Dorshimer, am acting as scribe for Dr. Nicholas Lose.  I have reviewed the above documentation for accuracy  and completeness, and I agree with the above.

## 2019-11-10 ENCOUNTER — Inpatient Hospital Stay: Payer: 59

## 2019-11-10 ENCOUNTER — Inpatient Hospital Stay: Payer: 59 | Attending: Hematology and Oncology

## 2019-11-10 ENCOUNTER — Other Ambulatory Visit: Payer: Self-pay

## 2019-11-10 ENCOUNTER — Inpatient Hospital Stay: Payer: 59 | Admitting: Hematology and Oncology

## 2019-11-10 DIAGNOSIS — C50211 Malignant neoplasm of upper-inner quadrant of right female breast: Secondary | ICD-10-CM | POA: Diagnosis not present

## 2019-11-10 DIAGNOSIS — Z170 Estrogen receptor positive status [ER+]: Secondary | ICD-10-CM

## 2019-11-10 DIAGNOSIS — Z923 Personal history of irradiation: Secondary | ICD-10-CM | POA: Insufficient documentation

## 2019-11-10 DIAGNOSIS — Z5112 Encounter for antineoplastic immunotherapy: Secondary | ICD-10-CM | POA: Diagnosis not present

## 2019-11-10 DIAGNOSIS — C50411 Malignant neoplasm of upper-outer quadrant of right female breast: Secondary | ICD-10-CM | POA: Insufficient documentation

## 2019-11-10 DIAGNOSIS — Z79811 Long term (current) use of aromatase inhibitors: Secondary | ICD-10-CM | POA: Diagnosis not present

## 2019-11-10 DIAGNOSIS — Z79899 Other long term (current) drug therapy: Secondary | ICD-10-CM | POA: Diagnosis not present

## 2019-11-10 DIAGNOSIS — Z9221 Personal history of antineoplastic chemotherapy: Secondary | ICD-10-CM | POA: Diagnosis not present

## 2019-11-10 DIAGNOSIS — Z95828 Presence of other vascular implants and grafts: Secondary | ICD-10-CM

## 2019-11-10 LAB — CBC WITH DIFFERENTIAL (CANCER CENTER ONLY)
Abs Immature Granulocytes: 0.01 10*3/uL (ref 0.00–0.07)
Basophils Absolute: 0 10*3/uL (ref 0.0–0.1)
Basophils Relative: 1 %
Eosinophils Absolute: 0.1 10*3/uL (ref 0.0–0.5)
Eosinophils Relative: 2 %
HCT: 35.5 % — ABNORMAL LOW (ref 36.0–46.0)
Hemoglobin: 11.7 g/dL — ABNORMAL LOW (ref 12.0–15.0)
Immature Granulocytes: 0 %
Lymphocytes Relative: 41 %
Lymphs Abs: 1.4 10*3/uL (ref 0.7–4.0)
MCH: 30 pg (ref 26.0–34.0)
MCHC: 33 g/dL (ref 30.0–36.0)
MCV: 91 fL (ref 80.0–100.0)
Monocytes Absolute: 0.3 10*3/uL (ref 0.1–1.0)
Monocytes Relative: 10 %
Neutro Abs: 1.5 10*3/uL — ABNORMAL LOW (ref 1.7–7.7)
Neutrophils Relative %: 46 %
Platelet Count: 257 10*3/uL (ref 150–400)
RBC: 3.9 MIL/uL (ref 3.87–5.11)
RDW: 13.2 % (ref 11.5–15.5)
WBC Count: 3.3 10*3/uL — ABNORMAL LOW (ref 4.0–10.5)
nRBC: 0 % (ref 0.0–0.2)

## 2019-11-10 LAB — CMP (CANCER CENTER ONLY)
ALT: 20 U/L (ref 0–44)
AST: 21 U/L (ref 15–41)
Albumin: 4 g/dL (ref 3.5–5.0)
Alkaline Phosphatase: 60 U/L (ref 38–126)
Anion gap: 6 (ref 5–15)
BUN: 23 mg/dL (ref 8–23)
CO2: 32 mmol/L (ref 22–32)
Calcium: 10.1 mg/dL (ref 8.9–10.3)
Chloride: 101 mmol/L (ref 98–111)
Creatinine: 0.83 mg/dL (ref 0.44–1.00)
GFR, Est AFR Am: >60 mL/min (ref >60)
GFR, Estimated: >60 mL/min (ref >60)
Glucose, Bld: 97 mg/dL (ref 70–99)
Potassium: 3.4 mmol/L — ABNORMAL LOW (ref 3.5–5.1)
Sodium: 139 mmol/L (ref 135–145)
Total Bilirubin: 0.4 mg/dL (ref 0.3–1.2)
Total Protein: 7.5 g/dL (ref 6.5–8.1)

## 2019-11-10 MED ORDER — HEPARIN SOD (PORK) LOCK FLUSH 100 UNIT/ML IV SOLN
500.0000 [IU] | Freq: Once | INTRAVENOUS | Status: AC | PRN
  Administered 2019-11-10: 500 [IU]
  Filled 2019-11-10: qty 5

## 2019-11-10 MED ORDER — SODIUM CHLORIDE 0.9% FLUSH
10.0000 mL | INTRAVENOUS | Status: DC | PRN
  Administered 2019-11-10: 15:00:00 10 mL
  Filled 2019-11-10: qty 10

## 2019-11-10 NOTE — Patient Instructions (Signed)

## 2019-11-10 NOTE — Assessment & Plan Note (Signed)
04/02/2019:Screening mammogram detected a mass in the right axilla. [place] confirmed a 0.9cm right breast mass. Biopsy showed IDC, grade 2, HER-2+ (3+), ER+ 100%, PR+ 5%, Ki67 15%.  Treatment plan: 1.Breast conserving surgery with sentinel lymph node biopsy 2.Adjuvant chemotherapy with Taxol Herceptin weekly x12 followed by Herceptin maintenance for 1 year started8/14/2020 3.Adjuvant radiation therapywill be starting 08/22/2019-09/21/2019 4.Followed by adjuvant antiestrogen therapy with anastrozole 1 mg daily x5 to 7 years started 10/01/2019 ------------------------------------------------------------------------------------------------------------------------------------------- Current Treatment:Herceptin maintenance therapy with anastrozole  Anastrozole toxicities:  No adverse effects to anastrozole or Herceptin. Return to clinic every 3 weeks for Herceptin every 6 weeks of follow-up with me.

## 2019-11-11 ENCOUNTER — Telehealth: Payer: Self-pay | Admitting: Hematology and Oncology

## 2019-11-11 NOTE — Telephone Encounter (Signed)
I left a message regarding schedule  

## 2019-11-12 ENCOUNTER — Encounter: Payer: Self-pay | Admitting: *Deleted

## 2019-11-13 ENCOUNTER — Other Ambulatory Visit: Payer: Self-pay

## 2019-11-13 ENCOUNTER — Ambulatory Visit: Payer: 59 | Admitting: Hematology and Oncology

## 2019-11-13 ENCOUNTER — Other Ambulatory Visit: Payer: 59

## 2019-11-13 ENCOUNTER — Inpatient Hospital Stay: Payer: 59

## 2019-11-13 VITALS — BP 141/77 | HR 65 | Temp 98.2°F | Resp 17 | Wt 169.0 lb

## 2019-11-13 DIAGNOSIS — Z170 Estrogen receptor positive status [ER+]: Secondary | ICD-10-CM

## 2019-11-13 DIAGNOSIS — C50211 Malignant neoplasm of upper-inner quadrant of right female breast: Secondary | ICD-10-CM

## 2019-11-13 DIAGNOSIS — C50411 Malignant neoplasm of upper-outer quadrant of right female breast: Secondary | ICD-10-CM | POA: Diagnosis not present

## 2019-11-13 MED ORDER — HEPARIN SOD (PORK) LOCK FLUSH 100 UNIT/ML IV SOLN
500.0000 [IU] | Freq: Once | INTRAVENOUS | Status: AC | PRN
  Administered 2019-11-13: 13:00:00 500 [IU]
  Filled 2019-11-13: qty 5

## 2019-11-13 MED ORDER — ACETAMINOPHEN 325 MG PO TABS
650.0000 mg | ORAL_TABLET | Freq: Once | ORAL | Status: AC
  Administered 2019-11-13: 650 mg via ORAL

## 2019-11-13 MED ORDER — SODIUM CHLORIDE 0.9 % IV SOLN
Freq: Once | INTRAVENOUS | Status: AC
  Filled 2019-11-13: qty 250

## 2019-11-13 MED ORDER — DIPHENHYDRAMINE HCL 25 MG PO CAPS
50.0000 mg | ORAL_CAPSULE | Freq: Once | ORAL | Status: AC
  Administered 2019-11-13: 50 mg via ORAL

## 2019-11-13 MED ORDER — DIPHENHYDRAMINE HCL 25 MG PO CAPS
ORAL_CAPSULE | ORAL | Status: AC
  Filled 2019-11-13: qty 2

## 2019-11-13 MED ORDER — TRASTUZUMAB-ANNS CHEMO 150 MG IV SOLR
450.0000 mg | Freq: Once | INTRAVENOUS | Status: AC
  Administered 2019-11-13: 450 mg via INTRAVENOUS
  Filled 2019-11-13: qty 21.43

## 2019-11-13 MED ORDER — ACETAMINOPHEN 325 MG PO TABS
ORAL_TABLET | ORAL | Status: AC
  Filled 2019-11-13: qty 2

## 2019-11-13 MED ORDER — SODIUM CHLORIDE 0.9% FLUSH
10.0000 mL | INTRAVENOUS | Status: DC | PRN
  Administered 2019-11-13: 10 mL
  Filled 2019-11-13: qty 10

## 2019-11-13 NOTE — Patient Instructions (Addendum)
[facility] Discharge Instructions for Patients Receiving Chemotherapy  Today you received the following chemotherapy agents: trastuzumab-anns  To help prevent nausea and vomiting after your treatment, we encourage you to take your nausea medication as directed.   If you develop nausea and vomiting that is not controlled by your nausea medication, call the clinic.   BELOW ARE SYMPTOMS THAT SHOULD BE REPORTED IMMEDIATELY:  *FEVER GREATER THAN 100.5 F  *CHILLS WITH OR WITHOUT FEVER  NAUSEA AND VOMITING THAT IS NOT CONTROLLED WITH YOUR NAUSEA MEDICATION  *UNUSUAL SHORTNESS OF BREATH  *UNUSUAL BRUISING OR BLEEDING  TENDERNESS IN MOUTH AND THROAT WITH OR WITHOUT PRESENCE OF ULCERS  *URINARY PROBLEMS  *BOWEL PROBLEMS  UNUSUAL RASH Items with * indicate a potential emergency and should be followed up as soon as possible.  Feel free to call the clinic should you have any questions or concerns. The clinic phone number is [phone].  Please show the CHEMO ALERT CARD at check-in to the Emergency Department and triage nurse.   

## 2019-12-04 ENCOUNTER — Inpatient Hospital Stay: Payer: 59 | Attending: Hematology and Oncology

## 2019-12-04 ENCOUNTER — Other Ambulatory Visit: Payer: Self-pay

## 2019-12-04 VITALS — BP 127/52 | HR 78 | Resp 16 | Ht 63.4 in | Wt 165.0 lb

## 2019-12-04 DIAGNOSIS — Z5111 Encounter for antineoplastic chemotherapy: Secondary | ICD-10-CM | POA: Diagnosis not present

## 2019-12-04 DIAGNOSIS — Z79899 Other long term (current) drug therapy: Secondary | ICD-10-CM | POA: Diagnosis not present

## 2019-12-04 DIAGNOSIS — C50411 Malignant neoplasm of upper-outer quadrant of right female breast: Secondary | ICD-10-CM | POA: Insufficient documentation

## 2019-12-04 DIAGNOSIS — Z923 Personal history of irradiation: Secondary | ICD-10-CM | POA: Insufficient documentation

## 2019-12-04 DIAGNOSIS — Z79811 Long term (current) use of aromatase inhibitors: Secondary | ICD-10-CM | POA: Diagnosis not present

## 2019-12-04 DIAGNOSIS — Z170 Estrogen receptor positive status [ER+]: Secondary | ICD-10-CM | POA: Diagnosis not present

## 2019-12-04 DIAGNOSIS — C50211 Malignant neoplasm of upper-inner quadrant of right female breast: Secondary | ICD-10-CM

## 2019-12-04 MED ORDER — ACETAMINOPHEN 325 MG PO TABS
650.0000 mg | ORAL_TABLET | Freq: Once | ORAL | Status: AC
  Administered 2019-12-04: 650 mg via ORAL

## 2019-12-04 MED ORDER — DIPHENHYDRAMINE HCL 25 MG PO CAPS
50.0000 mg | ORAL_CAPSULE | Freq: Once | ORAL | Status: AC
  Administered 2019-12-04: 50 mg via ORAL

## 2019-12-04 MED ORDER — ACETAMINOPHEN 325 MG PO TABS
ORAL_TABLET | ORAL | Status: AC
  Filled 2019-12-04: qty 2

## 2019-12-04 MED ORDER — TRASTUZUMAB-ANNS CHEMO 150 MG IV SOLR
450.0000 mg | Freq: Once | INTRAVENOUS | Status: AC
  Administered 2019-12-04: 450 mg via INTRAVENOUS
  Filled 2019-12-04: qty 21.43

## 2019-12-04 MED ORDER — DIPHENHYDRAMINE HCL 25 MG PO CAPS
ORAL_CAPSULE | ORAL | Status: AC
  Filled 2019-12-04: qty 1

## 2019-12-04 MED ORDER — SODIUM CHLORIDE 0.9 % IV SOLN
Freq: Once | INTRAVENOUS | Status: AC
  Filled 2019-12-04: qty 250

## 2019-12-04 MED ORDER — DIPHENHYDRAMINE HCL 25 MG PO CAPS
ORAL_CAPSULE | ORAL | Status: AC
  Filled 2019-12-04: qty 2

## 2019-12-04 MED ORDER — SODIUM CHLORIDE 0.9% FLUSH
10.0000 mL | INTRAVENOUS | Status: DC | PRN
  Administered 2019-12-04: 10 mL
  Filled 2019-12-04: qty 10

## 2019-12-04 MED ORDER — HEPARIN SOD (PORK) LOCK FLUSH 100 UNIT/ML IV SOLN
500.0000 [IU] | Freq: Once | INTRAVENOUS | Status: AC | PRN
  Administered 2019-12-04: 500 [IU]
  Filled 2019-12-04: qty 5

## 2019-12-04 NOTE — Patient Instructions (Signed)
[facility] Discharge Instructions for Patients Receiving Chemotherapy  Today you received the following chemotherapy agents: Trastuzumab   To help prevent nausea and vomiting after your treatment, we encourage you to take your nausea medication  as prescribed.    If you develop nausea and vomiting that is not controlled by your nausea medication, call the clinic.   BELOW ARE SYMPTOMS THAT SHOULD BE REPORTED IMMEDIATELY:  *FEVER GREATER THAN 100.5 F  *CHILLS WITH OR WITHOUT FEVER  NAUSEA AND VOMITING THAT IS NOT CONTROLLED WITH YOUR NAUSEA MEDICATION  *UNUSUAL SHORTNESS OF BREATH  *UNUSUAL BRUISING OR BLEEDING  TENDERNESS IN MOUTH AND THROAT WITH OR WITHOUT PRESENCE OF ULCERS  *URINARY PROBLEMS  *BOWEL PROBLEMS  UNUSUAL RASH Items with * indicate a potential emergency and should be followed up as soon as possible.  Feel free to call the clinic should you have any questions or concerns. The clinic phone number is [phone].  Please show the CHEMO ALERT CARD at check-in to the Emergency Department and triage nurse.   

## 2019-12-05 ENCOUNTER — Ambulatory Visit: Payer: 59 | Attending: Internal Medicine

## 2019-12-05 DIAGNOSIS — Z23 Encounter for immunization: Secondary | ICD-10-CM

## 2019-12-05 NOTE — Progress Notes (Signed)
   Covid-19 Vaccination Clinic  Name:  Emily Ross    MRN: [id number] DOB: 07/13/1956  12/05/2019  [name] was observed post Covid-19 immunization for 15 minutes without incident. She was provided with Vaccine Information Sheet and instruction to access the V-Safe system.   [name] was instructed to call 911 with any severe reactions post vaccine: Marland Kitchen Difficulty breathing  . Swelling of face and throat  . A fast heartbeat  . A bad rash all over body  . Dizziness and weakness   Immunizations Administered    Name Date Dose VIS Date Route   Pfizer COVID-19 Vaccine 12/05/2019  1:03 PM 0.3 mL 09/11/2019 Intramuscular   Manufacturer: [place]   Lot: [id number]   [place]: [id number]

## 2019-12-07 ENCOUNTER — Telehealth: Payer: Self-pay

## 2019-12-07 NOTE — Telephone Encounter (Signed)
RN returned call via on call from 3/6 regarding Covid Vaccination.   RN left voicemail for return call if still needing advice.

## 2019-12-21 NOTE — Progress Notes (Signed)
Pharmacist Chemotherapy Monitoring - Follow Up Assessment    I verify that I have reviewed each item in the below checklist:  . Regimen for the patient is scheduled for the appropriate day and plan matches scheduled date. Marland Kitchen Appropriate non-routine labs are ordered dependent on drug ordered. . If applicable, additional medications reviewed and ordered per protocol based on lifetime cumulative doses and/or treatment regimen.   Plan for follow-up and/or issues identified: Yes . I-vent associated with next due treatment: No . MD and/or nursing notified: No  Romualdo Bolk [facility] 12/21/2019 11:23 AM

## 2019-12-24 NOTE — Progress Notes (Signed)
Patient Care Team: Minette Brine, [place] as PCP - General (General Practice) Mauro Kaufmann, RN as Oncology Nurse Navigator Rockwell Germany, RN as Oncology Nurse Navigator Nicholas Lose, MD as Consulting Physician (Hematology and Oncology) Rolm Bookbinder, MD as Consulting Physician (General Surgery) Eppie Gibson, MD as Attending Physician (Radiation Oncology)  DIAGNOSIS:    ICD-10-CM   1. Malignant neoplasm of upper-outer quadrant of right breast in female, estrogen receptor positive ([place])  C50.411    Z17.0     SUMMARY OF ONCOLOGIC HISTORY: Oncology History  Malignant neoplasm of upper-outer quadrant of right breast in female, estrogen receptor positive ([place])  04/02/2019 Initial Diagnosis   Screening mammogram detected a mass in the right axilla. [place] confirmed a 0.9cm right breast mass. Biopsy showed IDC, grade 2, HER-2+ (3+), ER+ 100%, PR+ 5%, Ki67 15%.    04/08/2019 Cancer Staging   Staging form: Breast, AJCC 8th Edition - Clinical stage from 04/08/2019: Stage IA (cT1b, cN0, cM0, G2, ER+, PR+, HER2+) - Signed by Nicholas Lose, MD on 04/08/2019   04/14/2019 Genetic Testing   Patient had genetic testing done for her personal and family history of breast cancer.  Negative genetic testing on the [facility] Breast Cancer STAT panel. The STAT Breast cancer panel offered by [facility] includes sequencing and rearrangement analysis for the following 9 genes:  ATM, BRCA1, BRCA2, CDH1, CHEK2, PALB2, PTEN, STK11 and TP53.  The report date is 04/14/2019.   04/17/2019 Surgery   Right lumpectomy [name]): IDC, 2.0cm, grade 2, clear margins, 5 axillary lymph nodes negative for carcinoma   08/24/2019 - 09/21/2019 Radiation Therapy   Adjuvant radiation   10/01/2019 -  Anti-estrogen oral therapy   Anastrozole daily   Carcinoma of upper-inner quadrant of right breast in female, estrogen receptor positive ([place])  04/08/2019 Cancer Staging   Staging form: Breast, AJCC 8th Edition - Clinical stage from  04/08/2019: Stage IA (cT1b, cN0, cM0, G2, ER+, PR+, HER2+) - Signed by Eppie Gibson, MD on 04/08/2019   05/15/2019 -  Chemotherapy   The patient had PACLitaxel (TAXOL) 144 mg in sodium chloride 0.9 % 250 mL chemo infusion (</= 48m/m2), 80 mg/m2 = 144 mg, Intravenous,  Once, 3 of 3 cycles Administration: 144 mg (05/15/2019), 144 mg (05/22/2019), 144 mg (06/12/2019), 144 mg (05/29/2019), 144 mg (06/05/2019), 144 mg (06/19/2019), 144 mg (06/26/2019), 144 mg (07/03/2019), 144 mg (07/10/2019), 144 mg (07/17/2019), 144 mg (07/24/2019), 144 mg (07/31/2019) trastuzumab-anns (KANJINTI) 300 mg in sodium chloride 0.9 % 250 mL chemo infusion, 294 mg (100 % of original dose 4 mg/kg), Intravenous,  Once, 9 of 16 cycles Dose modification: 4 mg/kg (original dose 4 mg/kg, Cycle 1, Reason: Other (see comments), Comment: load), 6 mg/kg (original dose 2 mg/kg, Cycle 3, Reason: Other (see comments), Comment: q3wk dosing), 2 mg/kg (original dose 2 mg/kg, Cycle 3, Reason: Other (see comments)), 450 mg (original dose 2 mg/kg, Cycle 3, Reason: Other (see comments), Comment: Maintenance dosing), 450 mg (original dose 6 mg/kg, Cycle 4, Reason: Other (see comments), Comment: rounded; vial size), 6 mg/kg (original dose 2 mg/kg, Cycle 12, Reason: Other (see comments), Comment: pt on q3weeks), 6 mg/kg (original dose 2 mg/kg, Cycle 15, Reason: Other (see comments), Comment: dosing is q3weeks) Administration: 300 mg (05/15/2019), 150 mg (05/22/2019), 150 mg (05/29/2019), 150 mg (06/05/2019), 150 mg (06/12/2019), 150 mg (06/19/2019), 150 mg (06/26/2019), 150 mg (07/03/2019), 150 mg (07/10/2019), 150 mg (07/17/2019), 150 mg (07/24/2019), 450 mg (07/31/2019), 450 mg (08/21/2019), 450 mg (09/11/2019), 450 mg (10/01/2019), 450 mg (11/13/2019),  450 mg (12/04/2019), 450 mg (10/23/2019)  for chemotherapy treatment.      CHIEF COMPLIANT: Follow-up of right breast cancer on Herceptin and anastrozole  INTERVAL HISTORY: Emily Ross is a 64 y.o. with above-mentioned  history of right breast cancerwho underwent alumpectomy,adjuvant chemotherapy, andradiation.She is a participant in the UpBeat clinical trial.She is currently on treatment with Herceptin and anastrozole. She presents to the clinic today for treatment.   She is tolerating anastrozole extremely well.  She does not have any significant hot flashes or myalgias.  She has no side effects to Herceptin either.  ALLERGIES:  has No Known Allergies.  MEDICATIONS:  Current Outpatient Medications  Medication Sig Dispense Refill  . anastrozole (ARIMIDEX) 1 MG tablet Take 1 tablet (1 mg total) by mouth daily. 90 tablet 3  . carboxymethylcellul-glycerin (LUBRICATING EYE DROPS) 0.5-0.9 % ophthalmic solution Place 1 drop into both eyes as needed (dry eyes). 30 mL 1  . Cholecalciferol (DIALYVITE VITAMIN D 5000) 125 MCG (5000 UT) capsule Take 5,000 Units by mouth daily.    . diphenhydrAMINE (BENADRYL) 25 MG tablet Take 25 mg by mouth daily as needed for allergies.    . hydrochlorothiazide (HYDRODIURIL) 25 MG tablet Take 1 tablet (25 mg total) by mouth daily. 90 tablet 1  . ibuprofen (ADVIL) 600 MG tablet Take 1 tablet (600 mg total) by mouth every 6 (six) hours as needed. 30 tablet 2  . lidocaine-prilocaine (EMLA) cream Apply to affected area once 30 g 3  . metoprolol tartrate (LOPRESSOR) 50 MG tablet Take 1 tablet (50 mg total) by mouth 2 (two) times daily. 180 tablet 1  . Red Yeast Rice Extract (RED YEAST RICE PO) Take 2 capsules by mouth daily.    . simvastatin (ZOCOR) 10 MG tablet Take 1 tablet (10 mg total) by mouth every evening. 90 tablet 1   No current facility-administered medications for this visit.    PHYSICAL EXAMINATION: ECOG PERFORMANCE STATUS: 1 - Symptomatic but completely ambulatory  Vitals:   12/25/19 0908  BP: (!) 146/86  Pulse: 75  Resp: 18  Temp: 98.4 F (36.9 C)  SpO2: 98%   Filed Weights   12/25/19 0908  Weight: 168 lb 12.8 oz (76.6 kg)    LABORATORY DATA:  I have  reviewed the data as listed CMP Latest Ref Rng & Units 11/10/2019 10/01/2019 08/21/2019  Glucose 70 - 99 mg/dL 97 98 104(H)  BUN 8 - 23 mg/dL _0 Creatinine 0.44 - 1.00 mg/dL 0.83 1.00 0.81  Sodium 135 - 145 mmol/L 139 141 140  Potassium 3.5 - 5.1 mmol/L 3.4(L) 3.4(L) 3.4(L)  Chloride 98 - 111 mmol/L 101 101 103  CO2 22 - 32 mmol/L 32 29 30  Calcium 8.9 - 10.3 mg/dL 10.1 9.4 9.7  Total Protein 6.5 - 8.1 g/dL 7.5 7.4 7.0  Total Bilirubin 0.3 - 1.2 mg/dL 0.4 0.4 0.4  Alkaline Phos 38 - 126 U/L 60 59 63  AST 15 - 41 U/L _1 ALT 0 - 44 U/L _2 Lab Results  Component Value Date   WBC 3.5 (L) 12/25/2019   HGB 11.8 (L) 12/25/2019   HCT 36.0 12/25/2019   MCV 89.6 12/25/2019   PLT 238 12/25/2019   NEUTROABS 1.6 (L) 12/25/2019    ASSESSMENT & PLAN:  Malignant neoplasm of upper-outer quadrant of right breast in female, estrogen receptor positive ([place]) 04/02/2019:Screening mammogram detected a mass in the right axilla. [place] confirmed a 0.9cm right  breast mass. Biopsy showed IDC, grade 2, HER-2+ (3+), ER+ 100%, PR+ 5%, Ki67 15%.  Treatment plan: 1.Breast conserving surgery with sentinel lymph node biopsy 2.Adjuvant chemotherapy with Taxol Herceptin weekly x12 followed by Herceptin maintenance for 1 year started8/14/2020 3.Adjuvant radiation therapywill be starting 08/22/2019-09/21/2019 4.Followed by adjuvant antiestrogen therapy with anastrozole 1 mg daily x5 to 7 years started 11/09/2018 ------------------------------------------------------------------------------------------------------------------------------------------- Current Treatment:Herceptin maintenance therapy  (to be completed July 2021) with anastrozole No adverse effects to Herceptin Anastrozole: started 11/10/2019  Anastrozole toxicities: Denies any hot flashes or myalgias. Return to clinic every 3 weeks for Herceptin every 6 weeks of follow-up with me.    No orders of the defined types were  placed in this encounter.  The patient has a good understanding of the overall plan. she agrees with it. she will call with any problems that may develop before the next visit here.  Total time spent: 30 mins including face to face time and time spent for planning, charting and coordination of care  Nicholas Lose, MD 12/25/2019  I, Cloyde Reams Dorshimer, am acting as scribe for Dr. Nicholas Lose.  I have reviewed the above documentation for accuracy and completeness, and I agree with the above.

## 2019-12-25 ENCOUNTER — Inpatient Hospital Stay: Payer: 59 | Admitting: Hematology and Oncology

## 2019-12-25 ENCOUNTER — Inpatient Hospital Stay: Payer: 59

## 2019-12-25 ENCOUNTER — Other Ambulatory Visit: Payer: Self-pay

## 2019-12-25 DIAGNOSIS — Z95828 Presence of other vascular implants and grafts: Secondary | ICD-10-CM

## 2019-12-25 DIAGNOSIS — Z170 Estrogen receptor positive status [ER+]: Secondary | ICD-10-CM

## 2019-12-25 DIAGNOSIS — C50411 Malignant neoplasm of upper-outer quadrant of right female breast: Secondary | ICD-10-CM

## 2019-12-25 DIAGNOSIS — C50211 Malignant neoplasm of upper-inner quadrant of right female breast: Secondary | ICD-10-CM

## 2019-12-25 LAB — CBC WITH DIFFERENTIAL (CANCER CENTER ONLY)
Abs Immature Granulocytes: 0 10*3/uL (ref 0.00–0.07)
Basophils Absolute: 0 10*3/uL (ref 0.0–0.1)
Basophils Relative: 1 %
Eosinophils Absolute: 0.1 10*3/uL (ref 0.0–0.5)
Eosinophils Relative: 3 %
HCT: 36 % (ref 36.0–46.0)
Hemoglobin: 11.8 g/dL — ABNORMAL LOW (ref 12.0–15.0)
Immature Granulocytes: 0 %
Lymphocytes Relative: 41 %
Lymphs Abs: 1.4 10*3/uL (ref 0.7–4.0)
MCH: 29.4 pg (ref 26.0–34.0)
MCHC: 32.8 g/dL (ref 30.0–36.0)
MCV: 89.6 fL (ref 80.0–100.0)
Monocytes Absolute: 0.3 10*3/uL (ref 0.1–1.0)
Monocytes Relative: 9 %
Neutro Abs: 1.6 10*3/uL — ABNORMAL LOW (ref 1.7–7.7)
Neutrophils Relative %: 46 %
Platelet Count: 238 10*3/uL (ref 150–400)
RBC: 4.02 MIL/uL (ref 3.87–5.11)
RDW: 13.6 % (ref 11.5–15.5)
WBC Count: 3.5 10*3/uL — ABNORMAL LOW (ref 4.0–10.5)
nRBC: 0 % (ref 0.0–0.2)

## 2019-12-25 LAB — CMP (CANCER CENTER ONLY)
ALT: 16 U/L (ref 0–44)
AST: 22 U/L (ref 15–41)
Albumin: 4 g/dL (ref 3.5–5.0)
Alkaline Phosphatase: 61 U/L (ref 38–126)
Anion gap: 8 (ref 5–15)
BUN: 19 mg/dL (ref 8–23)
CO2: 29 mmol/L (ref 22–32)
Calcium: 9.9 mg/dL (ref 8.9–10.3)
Chloride: 101 mmol/L (ref 98–111)
Creatinine: 0.86 mg/dL (ref 0.44–1.00)
GFR, Est AFR Am: >60 mL/min (ref >60)
GFR, Estimated: >60 mL/min (ref >60)
Glucose, Bld: 106 mg/dL — ABNORMAL HIGH (ref 70–99)
Potassium: 3.4 mmol/L — ABNORMAL LOW (ref 3.5–5.1)
Sodium: 138 mmol/L (ref 135–145)
Total Bilirubin: 0.3 mg/dL (ref 0.3–1.2)
Total Protein: 7.5 g/dL (ref 6.5–8.1)

## 2019-12-25 MED ORDER — ACETAMINOPHEN 325 MG PO TABS
ORAL_TABLET | ORAL | Status: AC
  Filled 2019-12-25: qty 2

## 2019-12-25 MED ORDER — TRASTUZUMAB-ANNS CHEMO 150 MG IV SOLR
450.0000 mg | Freq: Once | INTRAVENOUS | Status: AC
  Administered 2019-12-25: 450 mg via INTRAVENOUS
  Filled 2019-12-25: qty 21.43

## 2019-12-25 MED ORDER — HEPARIN SOD (PORK) LOCK FLUSH 100 UNIT/ML IV SOLN
500.0000 [IU] | Freq: Once | INTRAVENOUS | Status: AC | PRN
  Administered 2019-12-25: 500 [IU]
  Filled 2019-12-25: qty 5

## 2019-12-25 MED ORDER — SODIUM CHLORIDE 0.9% FLUSH
10.0000 mL | INTRAVENOUS | Status: DC | PRN
  Administered 2019-12-25: 10 mL
  Filled 2019-12-25: qty 10

## 2019-12-25 MED ORDER — SODIUM CHLORIDE 0.9 % IV SOLN
Freq: Once | INTRAVENOUS | Status: AC
  Filled 2019-12-25: qty 250

## 2019-12-25 MED ORDER — ACETAMINOPHEN 325 MG PO TABS
650.0000 mg | ORAL_TABLET | Freq: Once | ORAL | Status: AC
  Administered 2019-12-25: 650 mg via ORAL

## 2019-12-25 MED ORDER — DIPHENHYDRAMINE HCL 25 MG PO CAPS
ORAL_CAPSULE | ORAL | Status: AC
  Filled 2019-12-25: qty 2

## 2019-12-25 MED ORDER — DIPHENHYDRAMINE HCL 25 MG PO CAPS
50.0000 mg | ORAL_CAPSULE | Freq: Once | ORAL | Status: AC
  Administered 2019-12-25: 50 mg via ORAL

## 2019-12-25 NOTE — Patient Instructions (Signed)
[facility] Discharge Instructions for Patients Receiving Chemotherapy  Today you received the following chemotherapy agents trastuzumab.  To help prevent nausea and vomiting after your treatment, we encourage you to take your nausea medication as directed.    If you develop nausea and vomiting that is not controlled by your nausea medication, call the clinic.   BELOW ARE SYMPTOMS THAT SHOULD BE REPORTED IMMEDIATELY:  *FEVER GREATER THAN 100.5 F  *CHILLS WITH OR WITHOUT FEVER  NAUSEA AND VOMITING THAT IS NOT CONTROLLED WITH YOUR NAUSEA MEDICATION  *UNUSUAL SHORTNESS OF BREATH  *UNUSUAL BRUISING OR BLEEDING  TENDERNESS IN MOUTH AND THROAT WITH OR WITHOUT PRESENCE OF ULCERS  *URINARY PROBLEMS  *BOWEL PROBLEMS  UNUSUAL RASH Items with * indicate a potential emergency and should be followed up as soon as possible.  Feel free to call the clinic should you have any questions or concerns. The clinic phone number is [phone].  Please show the CHEMO ALERT CARD at check-in to the Emergency Department and triage nurse.   

## 2019-12-25 NOTE — Assessment & Plan Note (Signed)
04/02/2019:Screening mammogram detected a mass in the right axilla. US confirmed a 0.9cm right breast mass. Biopsy showed IDC, grade 2, HER-2+ (3+), ER+ 100%, PR+ 5%, Ki67 15%.  Treatment plan: 1.Breast conserving surgery with sentinel lymph node biopsy 2.Adjuvant chemotherapy with Taxol Herceptin weekly x12 followed by Herceptin maintenance for 1 year started8/14/2020 3.Adjuvant radiation therapywill be starting 08/22/2019-09/21/2019 4.Followed by adjuvant antiestrogen therapy with anastrozole 1 mg daily x5 to 7 yearsstarted2/06/2019 ------------------------------------------------------------------------------------------------------------------------------------------- Current Treatment:Herceptin maintenance therapy (to be completed July 2021) with anastrozole No adverse effects to Herceptin Anastrozole: started 11/10/2019  Anastrozole toxicities: Denies any hot flashes or myalgias. Return to clinic every 3 weeks for Herceptin every 6 weeks of follow-up with me. 

## 2019-12-26 ENCOUNTER — Ambulatory Visit: Payer: 59 | Attending: Internal Medicine

## 2019-12-26 DIAGNOSIS — Z23 Encounter for immunization: Secondary | ICD-10-CM

## 2019-12-26 NOTE — Progress Notes (Signed)
   Covid-19 Vaccination Clinic  Name:  Emily Ross    MRN: [id number] DOB: [birth year]  12/26/2019  Emily Ross was observed post Covid-19 immunization for 15 minutes without incident. She was provided with Vaccine Information Sheet and instruction to access the V-Safe system.   Emily Ross was instructed to call 911 with any severe reactions post vaccine: Marland Kitchen Difficulty breathing  . Swelling of face and throat  . A fast heartbeat  . A bad rash all over body  . Dizziness and weakness   Immunizations Administered    Name Date Dose VIS Date Route   Pfizer COVID-19 Vaccine 12/26/2019  9:54 AM 0.3 mL 09/11/2019 Intramuscular   Manufacturer: [place]   Lot: [id number]   [place]: [id number]

## 2019-12-30 ENCOUNTER — Ambulatory Visit
Admission: RE | Admit: 2019-12-30 | Discharge: 2019-12-30 | Disposition: A | Discharge status: Home / Self Care | Payer: 59 | Source: Ambulatory Visit | Attending: Hematology and Oncology | Admitting: Hematology and Oncology

## 2019-12-30 ENCOUNTER — Other Ambulatory Visit: Payer: Self-pay

## 2019-12-30 DIAGNOSIS — Z780 Asymptomatic menopausal state: Secondary | ICD-10-CM

## 2020-01-01 ENCOUNTER — Ambulatory Visit (HOSPITAL_COMMUNITY)
Admission: RE | Admit: 2020-01-01 | Discharge: 2020-01-01 | Disposition: A | Discharge status: Home / Self Care | Payer: 59 | Source: Ambulatory Visit | Attending: Hematology and Oncology | Admitting: Hematology and Oncology

## 2020-01-01 ENCOUNTER — Other Ambulatory Visit: Payer: Self-pay

## 2020-01-01 ENCOUNTER — Telehealth: Payer: Self-pay | Admitting: *Deleted

## 2020-01-01 DIAGNOSIS — C50411 Malignant neoplasm of upper-outer quadrant of right female breast: Secondary | ICD-10-CM

## 2020-01-01 DIAGNOSIS — Z01818 Encounter for other preprocedural examination: Secondary | ICD-10-CM | POA: Insufficient documentation

## 2020-01-01 DIAGNOSIS — E785 Hyperlipidemia, unspecified: Secondary | ICD-10-CM | POA: Insufficient documentation

## 2020-01-01 DIAGNOSIS — I10 Essential (primary) hypertension: Secondary | ICD-10-CM | POA: Diagnosis not present

## 2020-01-01 DIAGNOSIS — Z170 Estrogen receptor positive status [ER+]: Secondary | ICD-10-CM | POA: Diagnosis not present

## 2020-01-01 NOTE — Progress Notes (Signed)
  Echocardiogram 2D Echocardiogram has been performed.  Darlina Sicilian M 01/01/2020, 9:54 AM

## 2020-01-01 NOTE — Telephone Encounter (Signed)
Attempt x1 to contact pt regarding recent bone density report and recommendations from [place] NP stating bone density shows osteopenia and to educate pt on calcium, vitamin D and weight bearing exercises.  No answer, LVM for pt to return call to the office.

## 2020-01-07 ENCOUNTER — Other Ambulatory Visit: Payer: Self-pay | Admitting: *Deleted

## 2020-01-07 DIAGNOSIS — Z170 Estrogen receptor positive status [ER+]: Secondary | ICD-10-CM

## 2020-01-07 DIAGNOSIS — C50411 Malignant neoplasm of upper-outer quadrant of right female breast: Secondary | ICD-10-CM

## 2020-01-11 NOTE — Progress Notes (Signed)
Pharmacist Chemotherapy Monitoring - Follow Up Assessment    I verify that I have reviewed each item in the below checklist:  . Regimen for the patient is scheduled for the appropriate day and plan matches scheduled date. Marland Kitchen Appropriate non-routine labs are ordered dependent on drug ordered. . If applicable, additional medications reviewed and ordered per protocol based on lifetime cumulative doses and/or treatment regimen.   Plan for follow-up and/or issues identified: No . I-vent associated with next due treatment: No . MD and/or nursing notified: No  Philomena Course 01/11/2020 3:13 PM

## 2020-01-14 ENCOUNTER — Encounter: Payer: Self-pay | Admitting: *Deleted

## 2020-01-15 ENCOUNTER — Other Ambulatory Visit: Payer: Self-pay

## 2020-01-15 ENCOUNTER — Inpatient Hospital Stay: Payer: 59 | Attending: Hematology and Oncology

## 2020-01-15 VITALS — BP 142/80 | HR 59 | Temp 98.5°F

## 2020-01-15 DIAGNOSIS — Z79811 Long term (current) use of aromatase inhibitors: Secondary | ICD-10-CM | POA: Insufficient documentation

## 2020-01-15 DIAGNOSIS — Z170 Estrogen receptor positive status [ER+]: Secondary | ICD-10-CM | POA: Insufficient documentation

## 2020-01-15 DIAGNOSIS — Z79899 Other long term (current) drug therapy: Secondary | ICD-10-CM | POA: Insufficient documentation

## 2020-01-15 DIAGNOSIS — C50211 Malignant neoplasm of upper-inner quadrant of right female breast: Secondary | ICD-10-CM

## 2020-01-15 DIAGNOSIS — Z5112 Encounter for antineoplastic immunotherapy: Secondary | ICD-10-CM | POA: Insufficient documentation

## 2020-01-15 DIAGNOSIS — C50411 Malignant neoplasm of upper-outer quadrant of right female breast: Secondary | ICD-10-CM | POA: Diagnosis not present

## 2020-01-15 DIAGNOSIS — Z923 Personal history of irradiation: Secondary | ICD-10-CM | POA: Insufficient documentation

## 2020-01-15 MED ORDER — ACETAMINOPHEN 325 MG PO TABS
650.0000 mg | ORAL_TABLET | Freq: Once | ORAL | Status: AC
  Administered 2020-01-15: 650 mg via ORAL

## 2020-01-15 MED ORDER — DIPHENHYDRAMINE HCL 25 MG PO CAPS
50.0000 mg | ORAL_CAPSULE | Freq: Once | ORAL | Status: AC
  Administered 2020-01-15: 50 mg via ORAL

## 2020-01-15 MED ORDER — HEPARIN SOD (PORK) LOCK FLUSH 100 UNIT/ML IV SOLN
500.0000 [IU] | Freq: Once | INTRAVENOUS | Status: AC | PRN
  Administered 2020-01-15: 500 [IU]
  Filled 2020-01-15: qty 5

## 2020-01-15 MED ORDER — SODIUM CHLORIDE 0.9% FLUSH
10.0000 mL | INTRAVENOUS | Status: DC | PRN
  Administered 2020-01-15: 10 mL
  Filled 2020-01-15: qty 10

## 2020-01-15 MED ORDER — SODIUM CHLORIDE 0.9 % IV SOLN
Freq: Once | INTRAVENOUS | Status: AC
  Filled 2020-01-15: qty 250

## 2020-01-15 MED ORDER — TRASTUZUMAB-ANNS CHEMO 150 MG IV SOLR
450.0000 mg | Freq: Once | INTRAVENOUS | Status: AC
  Administered 2020-01-15: 450 mg via INTRAVENOUS
  Filled 2020-01-15: qty 21.43

## 2020-01-15 MED ORDER — DIPHENHYDRAMINE HCL 25 MG PO CAPS
ORAL_CAPSULE | ORAL | Status: AC
  Filled 2020-01-15: qty 2

## 2020-01-15 MED ORDER — ACETAMINOPHEN 325 MG PO TABS
ORAL_TABLET | ORAL | Status: AC
  Filled 2020-01-15: qty 2

## 2020-01-15 NOTE — Patient Instructions (Signed)
[facility] °Discharge Instructions for Patients Receiving Chemotherapy ° °Today you received the following chemotherapy agents Trastuzumab ° °To help prevent nausea and vomiting after your treatment, we encourage you to take your nausea medication as directed. °  °If you develop nausea and vomiting that is not controlled by your nausea medication, call the clinic.  ° °BELOW ARE SYMPTOMS THAT SHOULD BE REPORTED IMMEDIATELY: °· *FEVER GREATER THAN 100.5 F °· *CHILLS WITH OR WITHOUT FEVER °· NAUSEA AND VOMITING THAT IS NOT CONTROLLED WITH YOUR NAUSEA MEDICATION °· *UNUSUAL SHORTNESS OF BREATH °· *UNUSUAL BRUISING OR BLEEDING °· TENDERNESS IN MOUTH AND THROAT WITH OR WITHOUT PRESENCE OF ULCERS °· *URINARY PROBLEMS °· *BOWEL PROBLEMS °· UNUSUAL RASH °Items with * indicate a potential emergency and should be followed up as soon as possible. ° °Feel free to call the clinic should you have any questions or concerns. The clinic phone number is [phone]. ° °Please show the CHEMO ALERT CARD at check-in to the Emergency Department and triage nurse. ° ° °

## 2020-02-01 NOTE — Progress Notes (Signed)
Pharmacist Chemotherapy Monitoring - Follow Up Assessment    I verify that I have reviewed each item in the below checklist:  . Regimen for the patient is scheduled for the appropriate day and plan matches scheduled date. Marland Kitchen Appropriate non-routine labs are ordered dependent on drug ordered. . If applicable, additional medications reviewed and ordered per protocol based on lifetime cumulative doses and/or treatment regimen.   Plan for follow-up and/or issues identified: No . I-vent associated with next due treatment: No . MD and/or nursing notified: No  Romualdo Bolk [facility] 02/01/2020 8:20 AM

## 2020-02-04 NOTE — Progress Notes (Signed)
Patient Care Team: Minette Brine, [place] as PCP - General (General Practice) Mauro Kaufmann, RN as Oncology Nurse Navigator Rockwell Germany, RN as Oncology Nurse Navigator Nicholas Lose, MD as Consulting Physician (Hematology and Oncology) Rolm Bookbinder, MD as Consulting Physician (General Surgery) Eppie Gibson, MD as Attending Physician (Radiation Oncology)  DIAGNOSIS:    ICD-10-CM   1. Malignant neoplasm of upper-outer quadrant of right breast in female, estrogen receptor positive ([place])  C50.411    Z17.0     SUMMARY OF ONCOLOGIC HISTORY: Oncology History  Malignant neoplasm of upper-outer quadrant of right breast in female, estrogen receptor positive ([place])  04/02/2019 Initial Diagnosis   Screening mammogram detected a mass in the right axilla. [place] confirmed a 0.9cm right breast mass. Biopsy showed IDC, grade 2, HER-2+ (3+), ER+ 100%, PR+ 5%, Ki67 15%.    04/08/2019 Cancer Staging   Staging form: Breast, AJCC 8th Edition - Clinical stage from 04/08/2019: Stage IA (cT1b, cN0, cM0, G2, ER+, PR+, HER2+) - Signed by Nicholas Lose, MD on 04/08/2019   04/14/2019 Genetic Testing   Patient had genetic testing done for her personal and family history of breast cancer.  Negative genetic testing on the [facility] Breast Cancer STAT panel. The STAT Breast cancer panel offered by [facility] includes sequencing and rearrangement analysis for the following 9 genes:  ATM, BRCA1, BRCA2, CDH1, CHEK2, PALB2, PTEN, STK11 and TP53.  The report date is 04/14/2019.   04/17/2019 Surgery   Right lumpectomy [name]): IDC, 2.0cm, grade 2, clear margins, 5 axillary lymph nodes negative for carcinoma   08/24/2019 - 09/21/2019 Radiation Therapy   Adjuvant radiation   10/01/2019 -  Anti-estrogen oral therapy   Anastrozole daily   Carcinoma of upper-inner quadrant of right breast in female, estrogen receptor positive ([place])  04/08/2019 Cancer Staging   Staging form: Breast, AJCC 8th Edition - Clinical stage from  04/08/2019: Stage IA (cT1b, cN0, cM0, G2, ER+, PR+, HER2+) - Signed by Eppie Gibson, MD on 04/08/2019   05/15/2019 -  Chemotherapy   The patient had PACLitaxel (TAXOL) 144 mg in sodium chloride 0.9 % 250 mL chemo infusion (</= 58m/m2), 80 mg/m2 = 144 mg, Intravenous,  Once, 3 of 3 cycles Administration: 144 mg (05/15/2019), 144 mg (05/22/2019), 144 mg (06/12/2019), 144 mg (05/29/2019), 144 mg (06/05/2019), 144 mg (06/19/2019), 144 mg (06/26/2019), 144 mg (07/03/2019), 144 mg (07/10/2019), 144 mg (07/17/2019), 144 mg (07/24/2019), 144 mg (07/31/2019) trastuzumab-anns (KANJINTI) 300 mg in sodium chloride 0.9 % 250 mL chemo infusion, 294 mg (100 % of original dose 4 mg/kg), Intravenous,  Once, 11 of 16 cycles Dose modification: 4 mg/kg (original dose 4 mg/kg, Cycle 1, Reason: Other (see comments), Comment: load), 6 mg/kg (original dose 2 mg/kg, Cycle 3, Reason: Other (see comments), Comment: q3wk dosing), 2 mg/kg (original dose 2 mg/kg, Cycle 3, Reason: Other (see comments)), 450 mg (original dose 2 mg/kg, Cycle 3, Reason: Other (see comments), Comment: Maintenance dosing), 450 mg (original dose 6 mg/kg, Cycle 4, Reason: Other (see comments), Comment: rounded; vial size), 6 mg/kg (original dose 2 mg/kg, Cycle 12, Reason: Other (see comments), Comment: pt on q3weeks), 6 mg/kg (original dose 2 mg/kg, Cycle 15, Reason: Other (see comments), Comment: dosing is q3weeks) Administration: 300 mg (05/15/2019), 150 mg (05/22/2019), 150 mg (05/29/2019), 150 mg (06/05/2019), 150 mg (06/12/2019), 150 mg (06/19/2019), 150 mg (06/26/2019), 150 mg (07/03/2019), 150 mg (07/10/2019), 150 mg (07/17/2019), 150 mg (07/24/2019), 450 mg (07/31/2019), 450 mg (08/21/2019), 450 mg (09/11/2019), 450 mg (10/01/2019), 450 mg (11/13/2019),  450 mg (12/04/2019), 450 mg (12/25/2019), 450 mg (10/23/2019), 450 mg (01/15/2020)  for chemotherapy treatment.      CHIEF COMPLIANT: Follow-upof right breast cancer on Herceptin and anastrozole  INTERVAL HISTORY: Emily Ross is a 64 y.o. with above-mentioned history of breast cancerwho underwent alumpectomy,adjuvant chemotherapy, andradiation.She is a participant in the [employer] clinical trial.She is currently on treatment with Herceptin and anastrozole.Echo on 01/01/20 showed an ejection fraction of 60-65%. She presents to the clinic todayfor treatment.   She has no adverse effects from either Herceptin or anastrozole.  ALLERGIES:  has No Known Allergies.  MEDICATIONS:  Current Outpatient Medications  Medication Sig Dispense Refill  . anastrozole (ARIMIDEX) 1 MG tablet Take 1 tablet (1 mg total) by mouth daily. 90 tablet 3  . carboxymethylcellul-glycerin (LUBRICATING EYE DROPS) 0.5-0.9 % ophthalmic solution Place 1 drop into both eyes as needed (dry eyes). 30 mL 1  . Cholecalciferol (DIALYVITE VITAMIN D 5000) 125 MCG (5000 UT) capsule Take 5,000 Units by mouth daily.    . diphenhydrAMINE (BENADRYL) 25 MG tablet Take 25 mg by mouth daily as needed for allergies.    . hydrochlorothiazide (HYDRODIURIL) 25 MG tablet Take 1 tablet (25 mg total) by mouth daily. 90 tablet 1  . ibuprofen (ADVIL) 600 MG tablet Take 1 tablet (600 mg total) by mouth every 6 (six) hours as needed. 30 tablet 2  . lidocaine-prilocaine (EMLA) cream Apply to affected area once 30 g 3  . metoprolol tartrate (LOPRESSOR) 50 MG tablet Take 1 tablet (50 mg total) by mouth 2 (two) times daily. 180 tablet 1  . Red Yeast Rice Extract (RED YEAST RICE PO) Take 2 capsules by mouth daily.    . simvastatin (ZOCOR) 10 MG tablet Take 1 tablet (10 mg total) by mouth every evening. 90 tablet 1   No current facility-administered medications for this visit.    PHYSICAL EXAMINATION: ECOG PERFORMANCE STATUS: 1 - Symptomatic but completely ambulatory  Vitals:   02/05/20 0845  BP: 130/75  Pulse: 64  Resp: 18  Temp: 97.8 F (36.6 C)  SpO2: 100%   Filed Weights   02/05/20 0845  Weight: 166 lb 4.8 oz (75.4 kg)    LABORATORY DATA:  I have  reviewed the data as listed CMP Latest Ref Rng & Units 12/25/2019 11/10/2019 10/01/2019  Glucose 70 - 99 mg/dL 106(H) 97 98  BUN 8 - 23 mg/dL 19 23 16   Creatinine 0.44 - 1.00 mg/dL 0.86 0.83 1.00  Sodium 135 - 145 mmol/L 138 139 141  Potassium 3.5 - 5.1 mmol/L 3.4(L) 3.4(L) 3.4(L)  Chloride 98 - 111 mmol/L 101 101 101  CO2 22 - 32 mmol/L 29 32 29  Calcium 8.9 - 10.3 mg/dL 9.9 10.1 9.4  Total Protein 6.5 - 8.1 g/dL 7.5 7.5 7.4  Total Bilirubin 0.3 - 1.2 mg/dL 0.3 0.4 0.4  Alkaline Phos 38 - 126 U/L 61 60 59  AST 15 - 41 U/L 22 21 23   ALT 0 - 44 U/L 16 20 18     Lab Results  Component Value Date   WBC 3.0 (L) 02/05/2020   HGB 11.6 (L) 02/05/2020   HCT 35.4 (L) 02/05/2020   MCV 93.2 02/05/2020   PLT 251 02/05/2020   NEUTROABS 1.4 (L) 02/05/2020    ASSESSMENT & PLAN:  Malignant neoplasm of upper-outer quadrant of right breast in female, estrogen receptor positive ([place]) 04/02/2019:Screening mammogram detected a mass in the right axilla. [place] confirmed a 0.9cm right breast mass. Biopsy showed IDC,  grade 2, HER-2+ (3+), ER+ 100%, PR+ 5%, Ki67 15%.  Treatment plan: 1.Breast conserving surgery with sentinel lymph node biopsy 2.Adjuvant chemotherapy with Taxol Herceptin weekly x12 followed by Herceptin maintenance for 1 year started8/14/2020 3.Adjuvant radiation therapywill be starting 08/22/2019-09/21/2019 4.Followed by adjuvant antiestrogen therapy with anastrozole 1 mg daily x5 to 7 yearsstarted2/06/2019 ------------------------------------------------------------------------------------------------------------------------------------------- Current Treatment:Herceptin maintenance therapy (to be completed July 2021) with anastrozole No adverse effects to Herceptin Anastrozole: started 11/10/2019  Anastrozole toxicities: Denies any hot flashes or myalgias. Return to clinic every 3 weeks for Herceptin every 6 weeks of follow-up with me.    No orders of the defined types  were placed in this encounter.  The patient has a good understanding of the overall plan. she agrees with it. she will call with any problems that may develop before the next visit here.  Total time spent: 30 mins including face to face time and time spent for planning, charting and coordination of care  Nicholas Lose, MD 02/05/2020  I, Cloyde Reams Dorshimer, am acting as scribe for Dr. Nicholas Lose.  I have reviewed the above documentation for accuracy and completeness, and I agree with the above.

## 2020-02-05 ENCOUNTER — Inpatient Hospital Stay: Payer: 59 | Attending: Hematology and Oncology

## 2020-02-05 ENCOUNTER — Other Ambulatory Visit: Payer: Self-pay

## 2020-02-05 ENCOUNTER — Inpatient Hospital Stay: Payer: 59 | Admitting: Hematology and Oncology

## 2020-02-05 ENCOUNTER — Inpatient Hospital Stay: Payer: 59

## 2020-02-05 DIAGNOSIS — C50411 Malignant neoplasm of upper-outer quadrant of right female breast: Secondary | ICD-10-CM

## 2020-02-05 DIAGNOSIS — C50211 Malignant neoplasm of upper-inner quadrant of right female breast: Secondary | ICD-10-CM | POA: Insufficient documentation

## 2020-02-05 DIAGNOSIS — Z170 Estrogen receptor positive status [ER+]: Secondary | ICD-10-CM | POA: Insufficient documentation

## 2020-02-05 DIAGNOSIS — Z79811 Long term (current) use of aromatase inhibitors: Secondary | ICD-10-CM | POA: Diagnosis not present

## 2020-02-05 DIAGNOSIS — Z923 Personal history of irradiation: Secondary | ICD-10-CM | POA: Diagnosis not present

## 2020-02-05 DIAGNOSIS — Z5112 Encounter for antineoplastic immunotherapy: Secondary | ICD-10-CM | POA: Diagnosis not present

## 2020-02-05 DIAGNOSIS — Z95828 Presence of other vascular implants and grafts: Secondary | ICD-10-CM

## 2020-02-05 LAB — CMP (CANCER CENTER ONLY)
ALT: 16 U/L (ref 0–44)
AST: 21 U/L (ref 15–41)
Albumin: 3.8 g/dL (ref 3.5–5.0)
Alkaline Phosphatase: 64 U/L (ref 38–126)
Anion gap: 10 (ref 5–15)
BUN: 17 mg/dL (ref 8–23)
CO2: 28 mmol/L (ref 22–32)
Calcium: 9.8 mg/dL (ref 8.9–10.3)
Chloride: 102 mmol/L (ref 98–111)
Creatinine: 0.8 mg/dL (ref 0.44–1.00)
GFR, Est AFR Am: >60 mL/min (ref >60)
GFR, Estimated: >60 mL/min (ref >60)
Glucose, Bld: 101 mg/dL — ABNORMAL HIGH (ref 70–99)
Potassium: 3.7 mmol/L (ref 3.5–5.1)
Sodium: 140 mmol/L (ref 135–145)
Total Bilirubin: 0.5 mg/dL (ref 0.3–1.2)
Total Protein: 7.4 g/dL (ref 6.5–8.1)

## 2020-02-05 LAB — CBC WITH DIFFERENTIAL (CANCER CENTER ONLY)
Abs Immature Granulocytes: 0.01 10*3/uL (ref 0.00–0.07)
Basophils Absolute: 0 10*3/uL (ref 0.0–0.1)
Basophils Relative: 1 %
Eosinophils Absolute: 0.2 10*3/uL (ref 0.0–0.5)
Eosinophils Relative: 5 %
HCT: 35.4 % — ABNORMAL LOW (ref 36.0–46.0)
Hemoglobin: 11.6 g/dL — ABNORMAL LOW (ref 12.0–15.0)
Immature Granulocytes: 0 %
Lymphocytes Relative: 36 %
Lymphs Abs: 1.1 10*3/uL (ref 0.7–4.0)
MCH: 30.5 pg (ref 26.0–34.0)
MCHC: 32.8 g/dL (ref 30.0–36.0)
MCV: 93.2 fL (ref 80.0–100.0)
Monocytes Absolute: 0.3 10*3/uL (ref 0.1–1.0)
Monocytes Relative: 10 %
Neutro Abs: 1.4 10*3/uL — ABNORMAL LOW (ref 1.7–7.7)
Neutrophils Relative %: 48 %
Platelet Count: 251 10*3/uL (ref 150–400)
RBC: 3.8 MIL/uL — ABNORMAL LOW (ref 3.87–5.11)
RDW: 13.2 % (ref 11.5–15.5)
WBC Count: 3 10*3/uL — ABNORMAL LOW (ref 4.0–10.5)
nRBC: 0 % (ref 0.0–0.2)

## 2020-02-05 MED ORDER — DIPHENHYDRAMINE HCL 25 MG PO CAPS
50.0000 mg | ORAL_CAPSULE | Freq: Once | ORAL | Status: AC
  Administered 2020-02-05: 50 mg via ORAL

## 2020-02-05 MED ORDER — HEPARIN SOD (PORK) LOCK FLUSH 100 UNIT/ML IV SOLN
500.0000 [IU] | Freq: Once | INTRAVENOUS | Status: AC | PRN
  Administered 2020-02-05: 500 [IU]
  Filled 2020-02-05: qty 5

## 2020-02-05 MED ORDER — SODIUM CHLORIDE 0.9% FLUSH
10.0000 mL | INTRAVENOUS | Status: DC | PRN
  Administered 2020-02-05: 10 mL
  Filled 2020-02-05: qty 10

## 2020-02-05 MED ORDER — DIPHENHYDRAMINE HCL 25 MG PO CAPS
ORAL_CAPSULE | ORAL | Status: AC
  Filled 2020-02-05: qty 2

## 2020-02-05 MED ORDER — TRASTUZUMAB-ANNS CHEMO 150 MG IV SOLR
450.0000 mg | Freq: Once | INTRAVENOUS | Status: AC
  Administered 2020-02-05: 450 mg via INTRAVENOUS
  Filled 2020-02-05: qty 21.43

## 2020-02-05 MED ORDER — SODIUM CHLORIDE 0.9 % IV SOLN
Freq: Once | INTRAVENOUS | Status: AC
  Filled 2020-02-05: qty 250

## 2020-02-05 MED ORDER — ACETAMINOPHEN 325 MG PO TABS
650.0000 mg | ORAL_TABLET | Freq: Once | ORAL | Status: AC
  Administered 2020-02-05: 650 mg via ORAL

## 2020-02-05 MED ORDER — ACETAMINOPHEN 325 MG PO TABS
ORAL_TABLET | ORAL | Status: AC
  Filled 2020-02-05: qty 2

## 2020-02-05 NOTE — Patient Instructions (Signed)
[facility] Discharge Instructions for Patients Receiving Chemotherapy  Today you received the following chemotherapy agents: Kanjinti (trastuzumab-anns).  To help prevent nausea and vomiting after your treatment, we encourage you to take your nausea medication as directed.   If you develop nausea and vomiting that is not controlled by your nausea medication, call the clinic.   BELOW ARE SYMPTOMS THAT SHOULD BE REPORTED IMMEDIATELY:  *FEVER GREATER THAN 100.5 F  *CHILLS WITH OR WITHOUT FEVER  NAUSEA AND VOMITING THAT IS NOT CONTROLLED WITH YOUR NAUSEA MEDICATION  *UNUSUAL SHORTNESS OF BREATH  *UNUSUAL BRUISING OR BLEEDING  TENDERNESS IN MOUTH AND THROAT WITH OR WITHOUT PRESENCE OF ULCERS  *URINARY PROBLEMS  *BOWEL PROBLEMS  UNUSUAL RASH Items with * indicate a potential emergency and should be followed up as soon as possible.  Feel free to call the clinic should you have any questions or concerns. The clinic phone number is [phone].  Please show the [name] at check-in to the Emergency Department and triage nurse.

## 2020-02-05 NOTE — Assessment & Plan Note (Signed)
04/02/2019:Screening mammogram detected a mass in the right axilla. [place] confirmed a 0.9cm right breast mass. Biopsy showed IDC, grade 2, HER-2+ (3+), ER+ 100%, PR+ 5%, Ki67 15%.  Treatment plan: 1.Breast conserving surgery with sentinel lymph node biopsy 2.Adjuvant chemotherapy with Taxol Herceptin weekly x12 followed by Herceptin maintenance for 1 year started8/14/2020 3.Adjuvant radiation therapywill be starting 08/22/2019-09/21/2019 4.Followed by adjuvant antiestrogen therapy with anastrozole 1 mg daily x5 to 7 yearsstarted2/06/2019 ------------------------------------------------------------------------------------------------------------------------------------------- Current Treatment:Herceptin maintenance therapy (to be completed July 2021) with anastrozole No adverse effects to Herceptin Anastrozole: started 11/10/2019  Anastrozole toxicities: Denies any hot flashes or myalgias. Return to clinic every 3 weeks for Herceptin every 6 weeks of follow-up with me.

## 2020-02-26 ENCOUNTER — Inpatient Hospital Stay: Payer: 59

## 2020-02-26 ENCOUNTER — Other Ambulatory Visit: Payer: Self-pay

## 2020-02-26 VITALS — BP 131/84 | HR 65 | Temp 98.6°F | Resp 17 | Wt 170.0 lb

## 2020-02-26 DIAGNOSIS — Z5112 Encounter for antineoplastic immunotherapy: Secondary | ICD-10-CM | POA: Diagnosis not present

## 2020-02-26 DIAGNOSIS — Z170 Estrogen receptor positive status [ER+]: Secondary | ICD-10-CM

## 2020-02-26 DIAGNOSIS — C50211 Malignant neoplasm of upper-inner quadrant of right female breast: Secondary | ICD-10-CM

## 2020-02-26 MED ORDER — TRASTUZUMAB-ANNS CHEMO 150 MG IV SOLR
450.0000 mg | Freq: Once | INTRAVENOUS | Status: AC
  Administered 2020-02-26: 450 mg via INTRAVENOUS
  Filled 2020-02-26: qty 21.43

## 2020-02-26 MED ORDER — ACETAMINOPHEN 325 MG PO TABS
650.0000 mg | ORAL_TABLET | Freq: Once | ORAL | Status: AC
  Administered 2020-02-26: 650 mg via ORAL

## 2020-02-26 MED ORDER — HEPARIN SOD (PORK) LOCK FLUSH 100 UNIT/ML IV SOLN
500.0000 [IU] | Freq: Once | INTRAVENOUS | Status: AC | PRN
  Administered 2020-02-26: 500 [IU]
  Filled 2020-02-26: qty 5

## 2020-02-26 MED ORDER — SODIUM CHLORIDE 0.9% FLUSH
10.0000 mL | INTRAVENOUS | Status: DC | PRN
  Administered 2020-02-26: 10 mL
  Filled 2020-02-26: qty 10

## 2020-02-26 MED ORDER — SODIUM CHLORIDE 0.9 % IV SOLN
Freq: Once | INTRAVENOUS | Status: AC
  Filled 2020-02-26: qty 250

## 2020-02-26 MED ORDER — ACETAMINOPHEN 325 MG PO TABS
ORAL_TABLET | ORAL | Status: AC
  Filled 2020-02-26: qty 2

## 2020-02-26 MED ORDER — DIPHENHYDRAMINE HCL 25 MG PO CAPS
50.0000 mg | ORAL_CAPSULE | Freq: Once | ORAL | Status: AC
  Administered 2020-02-26: 25 mg via ORAL

## 2020-02-26 MED ORDER — DEXAMETHASONE SODIUM PHOSPHATE 10 MG/ML IJ SOLN
INTRAMUSCULAR | Status: AC
  Filled 2020-02-26: qty 1

## 2020-02-26 MED ORDER — DIPHENHYDRAMINE HCL 25 MG PO CAPS
ORAL_CAPSULE | ORAL | Status: AC
  Filled 2020-02-26: qty 2

## 2020-02-26 MED ORDER — DEXAMETHASONE SODIUM PHOSPHATE 10 MG/ML IJ SOLN: 10.0000 mg | Freq: Once | INTRAMUSCULAR | Status: DC

## 2020-02-26 NOTE — Patient Instructions (Signed)
[facility] Discharge Instructions for Patients Receiving Chemotherapy  Today you received the following chemotherapy agents Trastuzumab (Kanjinti)  To help prevent nausea and vomiting after your treatment, we encourage you to take your nausea medication as directed.    If you develop nausea and vomiting that is not controlled by your nausea medication, call the clinic.   BELOW ARE SYMPTOMS THAT SHOULD BE REPORTED IMMEDIATELY:  *FEVER GREATER THAN 100.5 F  *CHILLS WITH OR WITHOUT FEVER  NAUSEA AND VOMITING THAT IS NOT CONTROLLED WITH YOUR NAUSEA MEDICATION  *UNUSUAL SHORTNESS OF BREATH  *UNUSUAL BRUISING OR BLEEDING  TENDERNESS IN MOUTH AND THROAT WITH OR WITHOUT PRESENCE OF ULCERS  *URINARY PROBLEMS  *BOWEL PROBLEMS  UNUSUAL RASH Items with * indicate a potential emergency and should be followed up as soon as possible.  Feel free to call the clinic should you have any questions or concerns. The clinic phone number is [phone].  Please show the CHEMO ALERT CARD at check-in to the Emergency Department and triage nurse.    

## 2020-03-02 ENCOUNTER — Telehealth: Payer: Self-pay | Admitting: Hematology and Oncology

## 2020-03-02 NOTE — Telephone Encounter (Signed)
Rescheduled appt with Dr. Lindi Adie to [facility] on 6/18. Provider on pal. Unable to reach pt- left voicemail.

## 2020-03-18 ENCOUNTER — Other Ambulatory Visit: Payer: Self-pay

## 2020-03-18 ENCOUNTER — Inpatient Hospital Stay: Payer: 59 | Attending: Hematology and Oncology

## 2020-03-18 ENCOUNTER — Inpatient Hospital Stay: Payer: 59 | Admitting: Adult Health

## 2020-03-18 ENCOUNTER — Ambulatory Visit: Payer: 59 | Admitting: Hematology and Oncology

## 2020-03-18 ENCOUNTER — Inpatient Hospital Stay: Payer: 59

## 2020-03-18 VITALS — BP 146/72 | HR 60 | Temp 97.6°F | Resp 20 | Ht 63.4 in | Wt 168.6 lb

## 2020-03-18 DIAGNOSIS — Z5112 Encounter for antineoplastic immunotherapy: Secondary | ICD-10-CM | POA: Diagnosis not present

## 2020-03-18 DIAGNOSIS — Z79811 Long term (current) use of aromatase inhibitors: Secondary | ICD-10-CM | POA: Insufficient documentation

## 2020-03-18 DIAGNOSIS — C50411 Malignant neoplasm of upper-outer quadrant of right female breast: Secondary | ICD-10-CM

## 2020-03-18 DIAGNOSIS — Z170 Estrogen receptor positive status [ER+]: Secondary | ICD-10-CM | POA: Insufficient documentation

## 2020-03-18 DIAGNOSIS — C50211 Malignant neoplasm of upper-inner quadrant of right female breast: Secondary | ICD-10-CM

## 2020-03-18 DIAGNOSIS — Z95828 Presence of other vascular implants and grafts: Secondary | ICD-10-CM

## 2020-03-18 DIAGNOSIS — Z79899 Other long term (current) drug therapy: Secondary | ICD-10-CM | POA: Insufficient documentation

## 2020-03-18 LAB — CBC WITH DIFFERENTIAL (CANCER CENTER ONLY)
Abs Immature Granulocytes: 0.01 10*3/uL (ref 0.00–0.07)
Basophils Absolute: 0 10*3/uL (ref 0.0–0.1)
Basophils Relative: 1 %
Eosinophils Absolute: 0.1 10*3/uL (ref 0.0–0.5)
Eosinophils Relative: 3 %
HCT: 33.2 % — ABNORMAL LOW (ref 36.0–46.0)
Hemoglobin: 11 g/dL — ABNORMAL LOW (ref 12.0–15.0)
Immature Granulocytes: 0 %
Lymphocytes Relative: 35 %
Lymphs Abs: 1.1 10*3/uL (ref 0.7–4.0)
MCH: 30.4 pg (ref 26.0–34.0)
MCHC: 33.1 g/dL (ref 30.0–36.0)
MCV: 91.7 fL (ref 80.0–100.0)
Monocytes Absolute: 0.3 10*3/uL (ref 0.1–1.0)
Monocytes Relative: 10 %
Neutro Abs: 1.6 10*3/uL — ABNORMAL LOW (ref 1.7–7.7)
Neutrophils Relative %: 51 %
Platelet Count: 240 10*3/uL (ref 150–400)
RBC: 3.62 MIL/uL — ABNORMAL LOW (ref 3.87–5.11)
RDW: 12.9 % (ref 11.5–15.5)
WBC Count: 3.1 10*3/uL — ABNORMAL LOW (ref 4.0–10.5)
nRBC: 0 % (ref 0.0–0.2)

## 2020-03-18 LAB — CMP (CANCER CENTER ONLY)
ALT: 14 U/L (ref 0–44)
AST: 19 U/L (ref 15–41)
Albumin: 3.8 g/dL (ref 3.5–5.0)
Alkaline Phosphatase: 64 U/L (ref 38–126)
Anion gap: 7 (ref 5–15)
BUN: 23 mg/dL (ref 8–23)
CO2: 28 mmol/L (ref 22–32)
Calcium: 9.8 mg/dL (ref 8.9–10.3)
Chloride: 103 mmol/L (ref 98–111)
Creatinine: 0.82 mg/dL (ref 0.44–1.00)
GFR, Est AFR Am: >60 mL/min (ref >60)
GFR, Estimated: >60 mL/min (ref >60)
Glucose, Bld: 91 mg/dL (ref 70–99)
Potassium: 3.4 mmol/L — ABNORMAL LOW (ref 3.5–5.1)
Sodium: 138 mmol/L (ref 135–145)
Total Bilirubin: 0.3 mg/dL (ref 0.3–1.2)
Total Protein: 7.2 g/dL (ref 6.5–8.1)

## 2020-03-18 MED ORDER — ACETAMINOPHEN 325 MG PO TABS
ORAL_TABLET | ORAL | Status: AC
  Filled 2020-03-18: qty 2

## 2020-03-18 MED ORDER — SODIUM CHLORIDE 0.9% FLUSH
10.0000 mL | INTRAVENOUS | Status: DC | PRN
  Administered 2020-03-18 (×2): 10 mL
  Filled 2020-03-18: qty 10

## 2020-03-18 MED ORDER — DIPHENHYDRAMINE HCL 25 MG PO CAPS
50.0000 mg | ORAL_CAPSULE | Freq: Once | ORAL | Status: AC
  Administered 2020-03-18: 50 mg via ORAL

## 2020-03-18 MED ORDER — ACETAMINOPHEN 325 MG PO TABS
650.0000 mg | ORAL_TABLET | Freq: Once | ORAL | Status: AC
  Administered 2020-03-18: 650 mg via ORAL

## 2020-03-18 MED ORDER — SODIUM CHLORIDE 0.9 % IV SOLN
Freq: Once | INTRAVENOUS | Status: AC
  Filled 2020-03-18: qty 250

## 2020-03-18 MED ORDER — TRASTUZUMAB-ANNS CHEMO 150 MG IV SOLR
450.0000 mg | Freq: Once | INTRAVENOUS | Status: AC
  Administered 2020-03-18: 450 mg via INTRAVENOUS
  Filled 2020-03-18: qty 21.43

## 2020-03-18 MED ORDER — SODIUM CHLORIDE 0.9% FLUSH
10.0000 mL | INTRAVENOUS | Status: DC | PRN
  Administered 2020-03-18: 10 mL
  Filled 2020-03-18: qty 10

## 2020-03-18 MED ORDER — DIPHENHYDRAMINE HCL 25 MG PO CAPS
ORAL_CAPSULE | ORAL | Status: AC
  Filled 2020-03-18: qty 2

## 2020-03-18 MED ORDER — HEPARIN SOD (PORK) LOCK FLUSH 100 UNIT/ML IV SOLN
500.0000 [IU] | Freq: Once | INTRAVENOUS | Status: AC | PRN
  Administered 2020-03-18: 500 [IU]
  Filled 2020-03-18: qty 5

## 2020-03-18 NOTE — Progress Notes (Signed)
SURVIVORSHIP VISIT:    BRIEF ONCOLOGIC HISTORY:  Oncology History  Malignant neoplasm of upper-outer quadrant of right breast in female, estrogen receptor positive ([place])  04/02/2019 Initial Diagnosis   Screening mammogram detected a mass in the right axilla. [place] confirmed a 0.9cm right breast mass. Biopsy showed IDC, grade 2, HER-2+ (3+), ER+ 100%, PR+ 5%, Ki67 15%.    04/08/2019 Cancer Staging   Staging form: Breast, AJCC 8th Edition - Clinical stage from 04/08/2019: Stage IA (cT1b, cN0, cM0, G2, ER+, PR+, HER2+) - Signed by Nicholas Lose, MD on 04/08/2019   04/14/2019 Genetic Testing   Patient had genetic testing done for her personal and family history of breast cancer.  Negative genetic testing on the [facility] Breast Cancer STAT panel. The STAT Breast cancer panel offered by [facility] includes sequencing and rearrangement analysis for the following 9 genes:  ATM, BRCA1, BRCA2, CDH1, CHEK2, PALB2, PTEN, STK11 and TP53.  The report date is 04/14/2019.   04/17/2019 Surgery   Right lumpectomy [name]): IDC, 2.0cm, grade 2, clear margins, 5 axillary lymph nodes negative for carcinoma   08/24/2019 - 09/21/2019 Radiation Therapy   Adjuvant radiation   10/01/2019 -  Anti-estrogen oral therapy   Anastrozole daily   Carcinoma of upper-inner quadrant of right breast in female, estrogen receptor positive ([place])  04/08/2019 Cancer Staging   Staging form: Breast, AJCC 8th Edition - Clinical stage from 04/08/2019: Stage IA (cT1b, cN0, cM0, G2, ER+, PR+, HER2+) - Signed by Eppie Gibson, MD on 04/08/2019   05/15/2019 -  Chemotherapy   The patient had PACLitaxel (TAXOL) 144 mg in sodium chloride 0.9 % 250 mL chemo infusion (</= 31m/m2), 80 mg/m2 = 144 mg, Intravenous,  Once, 3 of 3 cycles Administration: 144 mg (05/15/2019), 144 mg (05/22/2019), 144 mg (06/12/2019), 144 mg (05/29/2019), 144 mg (06/05/2019), 144 mg (06/19/2019), 144 mg (06/26/2019), 144 mg (07/03/2019), 144 mg (07/10/2019), 144 mg (07/17/2019), 144 mg  (07/24/2019), 144 mg (07/31/2019) trastuzumab-anns (KANJINTI) 300 mg in sodium chloride 0.9 % 250 mL chemo infusion, 294 mg (100 % of original dose 4 mg/kg), Intravenous,  Once, 13 of 16 cycles Dose modification: 4 mg/kg (original dose 4 mg/kg, Cycle 1, Reason: Other (see comments), Comment: load), 6 mg/kg (original dose 2 mg/kg, Cycle 3, Reason: Other (see comments), Comment: q3wk dosing), 2 mg/kg (original dose 2 mg/kg, Cycle 3, Reason: Other (see comments)), 450 mg (original dose 2 mg/kg, Cycle 3, Reason: Other (see comments), Comment: Maintenance dosing), 450 mg (original dose 6 mg/kg, Cycle 4, Reason: Other (see comments), Comment: rounded; vial size), 6 mg/kg (original dose 2 mg/kg, Cycle 12, Reason: Other (see comments), Comment: pt on q3weeks), 6 mg/kg (original dose 2 mg/kg, Cycle 15, Reason: Other (see comments), Comment: dosing is q3weeks) Administration: 300 mg (05/15/2019), 150 mg (05/22/2019), 150 mg (05/29/2019), 150 mg (06/05/2019), 150 mg (06/12/2019), 150 mg (06/19/2019), 150 mg (06/26/2019), 150 mg (07/03/2019), 150 mg (07/10/2019), 150 mg (07/17/2019), 150 mg (07/24/2019), 450 mg (07/31/2019), 450 mg (08/21/2019), 450 mg (09/11/2019), 450 mg (10/01/2019), 450 mg (11/13/2019), 450 mg (12/04/2019), 450 mg (02/05/2020), 450 mg (02/26/2020), 450 mg (12/25/2019), 450 mg (10/23/2019), 450 mg (01/15/2020)  for chemotherapy treatment.      INTERVAL HISTORY:  Emily Ross review her survivorship care plan detailing her treatment course for breast cancer, as well as monitoring long-term side effects of that treatment, education regarding health maintenance, screening, and overall wellness and health promotion.     Overall, Emily Ross reports feeling moderately well.  She is taking anastrozole  daily.  She has joint stiffness first thing in the morning, but this improves once she gets up and starts moving around.  She is finishing up her maintenance Herceptin and tolerates this well.  She finishes this up at the  end of next month.    Bone density showed osteopenia in 11/2019. She completed survivorship survey and was positive for financial concerns, difficulty remembering (which is improving), mild fatigue (improving), tolerable hot flashes, snoring, and weight concerns.   REVIEW OF SYSTEMS:  Review of Systems  Constitutional: Positive for fatigue. Negative for appetite change, chills, fever and unexpected weight change.  HENT:   Negative for hearing loss, lump/mass and mouth sores.   Eyes: Negative for eye problems and icterus.  Respiratory: Negative for chest tightness, cough and shortness of breath.   Cardiovascular: Negative for chest pain, leg swelling and palpitations.  Gastrointestinal: Negative for abdominal distention, abdominal pain, constipation, diarrhea, nausea and vomiting.  Endocrine: Positive for hot flashes.  Genitourinary: Negative for difficulty urinating.   Musculoskeletal: Positive for arthralgias.  Skin: Negative for itching and rash.  Neurological: Negative for dizziness, extremity weakness, headaches and numbness.  Hematological: Negative for adenopathy. Does not bruise/bleed easily.  Psychiatric/Behavioral: Negative for depression. The patient is not nervous/anxious.    Breast: Denies any new nodularity, masses, tenderness, nipple changes, or nipple discharge.      ONCOLOGY TREATMENT TEAM:  1. Surgeon:  Dr. [name] at [facility] 2. Medical Oncologist: Dr. Lindi Adie  3. Radiation Oncologist: Dr. Isidore Moos    PAST MEDICAL/SURGICAL HISTORY:  Past Medical History:  Diagnosis Date  . Breast cancer ([place])   . Cancer [facility])    2009 left breast 2020 right breast  . Family history of breast cancer   . Family history of stomach cancer   . High cholesterol   . History of radiation therapy 08/23/19- 09/21/19   Right breast 15 fx of 2.67 Gy to total 40.05 Gy. Right Breast boost 5 fx of 2 Gy each to total 10 Gy  . History of seasonal allergies   . Hypertension    . Pneumonia   . Wears glasses    Past Surgical History:  Procedure Laterality Date  . AXILLARY SENTINEL NODE BIOPSY Right 04/17/2019   Procedure: Right Axillary Sentinel Node Biopsy;  Surgeon: Rolm Bookbinder, MD;  Location: [place];  Service: General;  Laterality: Right;  . BREAST LUMPECTOMY    . BREAST LUMPECTOMY WITH RADIOACTIVE SEED AND SENTINEL LYMPH NODE BIOPSY Right 04/17/2019   Procedure: RIGHT BREAST LUMPECTOMY WITH RADIOACTIVE SEED;  Surgeon: Rolm Bookbinder, MD;  Location: [place];  Service: General;  Laterality: Right;  . COLONOSCOPY    . PORTACATH PLACEMENT Right 04/17/2019   Procedure: INSERTION PORT-A-CATH WITH ULTRASOUND;  Surgeon: Rolm Bookbinder, MD;  Location: [place];  Service: General;  Laterality: Right;  . TUBAL LIGATION    . WISDOM TOOTH EXTRACTION       ALLERGIES:  No Known Allergies   CURRENT MEDICATIONS:  Outpatient Encounter Medications as of 03/18/2020  Medication Sig  . anastrozole (ARIMIDEX) 1 MG tablet Take 1 tablet (1 mg total) by mouth daily.  . carboxymethylcellul-glycerin (LUBRICATING EYE DROPS) 0.5-0.9 % ophthalmic solution Place 1 drop into both eyes as needed (dry eyes).  . Cholecalciferol (DIALYVITE VITAMIN D 5000) 125 MCG (5000 UT) capsule Take 5,000 Units by mouth daily.  . diphenhydrAMINE (BENADRYL) 25 MG tablet Take 25 mg by mouth daily as needed for allergies.  . hydrochlorothiazide (HYDRODIURIL) 25 MG tablet Take 1 tablet (  25 mg total) by mouth daily.  Marland Kitchen ibuprofen (ADVIL) 600 MG tablet Take 1 tablet (600 mg total) by mouth every 6 (six) hours as needed.  . lidocaine-prilocaine (EMLA) cream Apply to affected area once  . metoprolol tartrate (LOPRESSOR) 50 MG tablet Take 1 tablet (50 mg total) by mouth 2 (two) times daily.  . Red Yeast Rice Extract (RED YEAST RICE PO) Take 2 capsules by mouth daily.  . simvastatin (ZOCOR) 10 MG tablet Take 1 tablet (10 mg total) by mouth every evening.  . [DISCONTINUED] sodium chloride flush (NS) 0.9 %  injection 10 mL    No facility-administered encounter medications on file as of 03/18/2020.     ONCOLOGIC FAMILY HISTORY:  Family History  Problem Relation Age of Onset  . Diabetes Father   . Hypertension Father   . Stomach cancer Father 75  . Diabetes Mother   . Hypertension Mother   . Breast cancer Sister 57       negative genetic testing  . Breast cancer Maternal Aunt 80  . Colon cancer Neg Hx   . Esophageal cancer Neg Hx   . Rectal cancer Neg Hx      GENETIC COUNSELING/TESTING: See above  SOCIAL HISTORY:  Social History   Socioeconomic History  . Marital status: Married    Spouse name: Not on file  . Number of children: Not on file  . Years of education: Not on file  . Highest education level: Not on file  Occupational History  . Not on file  Tobacco Use  . Smoking status: Never Smoker  . Smokeless tobacco: Never Used  Vaping Use  . Vaping Use: Never used  Substance and Sexual Activity  . Alcohol use: No  . Drug use: No  . Sexual activity: Yes    Birth control/protection: Post-menopausal, None  Other Topics Concern  . Not on file  Social History Narrative   ** Merged History Encounter **       Social Determinants of Health   Financial Resource Strain:   . Difficulty of Paying Living Expenses:   Food Insecurity:   . Worried About Charity fundraiser in the Last Year:   . Arboriculturist in the Last Year:   Transportation Needs: No Transportation Needs  . Lack of Transportation (Medical): No  . Lack of Transportation (Non-Medical): No  Physical Activity:   . Days of Exercise per Week:   . Minutes of Exercise per Session:   Stress:   . Feeling of Stress :   Social Connections:   . Frequency of Communication with Friends and Family:   . Frequency of Social Gatherings with Friends and Family:   . Attends Religious Services:   . Active Member of Clubs or Organizations:   . Attends Archivist Meetings:   Marland Kitchen Marital Status:   Intimate  Partner Violence: Not At Risk  . Fear of Current or Ex-Partner: No  . Emotionally Abused: No  . Physically Abused: No  . Sexually Abused: No     OBSERVATIONS/OBJECTIVE:  BP (!) 146/72   Pulse 60   Temp 97.6 F (36.4 C) (Tympanic)   Resp 20   Ht 5' 3.4" (1.61 m)   Wt 168 lb 9.6 oz (76.5 kg)   LMP 12/03/2011   SpO2 100%   BMI 29.49 kg/m . GENERAL: Patient is a well appearing female in no acute distress HEENT:  Sclerae anicteric.  Mask in place. Neck is supple.  NODES:  No cervical, supraclavicular, or axillary lymphadenopathy palpated.  BREAST EXAM:  Right breast s/p lumpectomy and radiation, no sign of local recurrence, left breast s/p lumpectomy and radiation, no sign of local recurrence.   LUNGS:  Clear to auscultation bilaterally.  No wheezes or rhonchi. HEART:  Regular rate and rhythm. No murmur appreciated. ABDOMEN:  Soft, nontender.  Positive, normoactive bowel sounds. No organomegaly palpated. MSK:  No focal spinal tenderness to palpation. Full range of motion bilaterally in the upper extremities. EXTREMITIES:  No peripheral edema.   SKIN:  Clear with no obvious rashes or skin changes. No nail dyscrasia. NEURO:  Nonfocal. Well oriented.  Appropriate affect.    LABORATORY DATA:  None for this visit.  DIAGNOSTIC IMAGING:  EXAM: DUAL X-RAY ABSORPTIOMETRY (DXA) FOR BONE MINERAL DENSITY  IMPRESSION: Referring Physician:  Nicholas Lose Your patient completed a BMD test using Lunar IDXA DXA system ( analysis version: 16 ) manufactured by [employer]. Technologist: KT  PATIENT: Name: Lucia, Harm Patient ID:  [id number]  Birth Date: 08/19/1956 Height:     65.4 in. Sex:         Female  Measured:   12/30/2019 Weight:     169.6 lbs. Indications: Arimidex, Breast Cancer History, Estrogen Deficient, Postmenopausal, Secondary Osteoporosis Fractures: Toe Treatments: Vitamin D (E933.5)  ASSESSMENT: The BMD measured at Femur Neck Right is 0.771 g/cm2 with a  T-score of -1.9.  This patient is considered OSTEOPENIC according to [facility]) criteria. The scan quality is good.  L-3 and L-4 were excluded due to degenerative changes.  Site Region Measured Date Measured Age YA T-score BMD Significant CHANGE  DualFemur Neck Right 12/30/2019    64.6         -1.9    0.771 g/cm2  AP Spine  L1-L2      12/30/2019    64.6         -1.6    0.970 g/cm2  DualFemur Total Mean 12/30/2019    64.6         -1.1    0.868 g/cm2  World Health Organization [facility]) criteria for post-menopausal, Caucasian Women: Normal       T-score at or above -1 SD Osteopenia   T-score between -1 and -2.5 SD Osteoporosis T-score at or below -2.5 SD  RECOMMENDATION: 1. All patients should optimize calcium and vitamin D intake. 2. Consider FDA approved medical therapies in postmenopausal women and men aged 52 years and older, based on the following: a. A hip or vertebral (clinical or morphometric) fracture b. T-score < or = -2.5 at the femoral neck or spine after appropriate evaluation to exclude secondary causes c. Low bone mass (T-score between -1.0 and -2.5 at the femoral neck or spine) and a 10 year probability of a hip fracture > or = 3% or a 10 year probability of a major osteoporosis-related fracture > or = 20% based on the [place]-adapted WHO algorithm d. Clinician judgment and/or patient preferences may indicate treatment for people with 10-year fracture probabilities above or below these levels  FOLLOW-UP: Patients with diagnosis of osteoporosis or at high risk for fracture should have regular bone mineral density tests. For patients eligible for Medicare, routine testing is allowed once every 2 years.  The testing frequency can be increased to one year for patients who have rapidly progressing disease, those who are receiving or discontinuing medical therapy to restore bone mass, or have additional risk factors.  I have reviewed  this report and agree with  the above findings.  Mark A. Thornton Papas, M.D. [facility]  FRAX* 10-year Probability of Fracture Based on femoral neck BMD: DualFemur (Right)  Major Osteoporotic Fracture: 4.6% Hip Fracture:                0.6% Population:                  [place] ([place]) Risk Factors:                Secondary Osteoporosis  *FRAX is a Materials engineer of the [employer] for Metabolic Bone Disease, a [place] (WHO) [facility].  ASSESSMENT: The probability of a major osteoporotic fracture is 4.6 % within the next ten years. The probability of a hip fracture is 0.6 % within the next ten years.  I have reviewed this report and agree with the above findings.  Mark A. Thornton Papas, M.D.  [facility]   Electronically Signed   By: Lavonia Dana M.D.   On: 12/30/2019 15:30    ASSESSMENT AND PLAN:  Ms.. Thobe is a pleasant 64 y.o. female with Stage IA left breast invasive ductal carcinoma, ER+/PR+/HER2+, diagnosed in 04/2019, treated with lumpectomy, adjuvant chemotherapy, maintenance Herceptin, adjuvant radiation therapy, and anti-estrogen therapy with Anastrozole beginning in 09/2019.  She presents to the Survivorship Clinic for our initial meeting and routine follow-up post-completion of treatment for breast cancer.    1. Stage IA right breast cancer:  Emily Ross is continuing to recover from definitive treatment for breast cancer. She will follow-up with her medical oncologist, Dr. Lindi Adie in 03/2020 with history and physical exam per surveillance protocol.  She will continue her anti-estrogen therapy with Anastrozole. Thus far, she is tolerating the Anastrozole well, with minimal side effects. She was instructed to make Dr. Lindi Adie or myself aware if she begins to experience any worsening side effects of the medication and I could see her back in clinic to help manage those side effects, as needed. Her  mammogram is due 03/2020; orders placed today.  She will have three more doses of Herceptin including today's dose.  I sent a  Message to Dr. [name] requesting they get her port removal scheduled for early august.  I also placed orders for her next echo due in July.    Today, a comprehensive survivorship care plan and treatment summary was reviewed with the patient today detailing her breast cancer diagnosis, treatment course, potential late/long-term effects of treatment, appropriate follow-up care with recommendations for the future, and patient education resources.  A copy of this summary, along with a letter will be sent to the patient's primary care provider via mail/fax/In Basket message after today's visit.    2. Positive distress screen: QOLCSV administered and sent to social work  3. Cognitive dysfunction: mild and improving.  Should it worsen she and I discussed Dr. Mickeal Skinner and his availability to see our patients.    4. Weight concerns: We reviewed healthy diet and exercise recommendations.  She is going to look into the healthy weight and wellness clinic where Dr. Leafy Ro and Dr. Juleen China practice.    5. Bone health: She has osteopenia noted on 11/2019 bone density testing.  We discussed calcium, vitamin D and weight bearing exercise.  Repeat will be due in 11/2021.  6. Cancer screening:  Due to Emily Ross's history and her age, she should receive screening for skin cancers, colon cancer, and gynecologic cancers.  The information and recommendations are listed on the patient's comprehensive  care plan/treatment summary and were reviewed in detail with the patient.    7. Health maintenance and wellness promotion: Ms. Billiter was encouraged to consume 5-7 servings of fruits and vegetables per day. We reviewed the "Nutrition Rainbow" handout, as well as the handout "Take Control of Your Health and Reduce Your Cancer Risk" from the [place].  She was also encouraged to engage in  moderate to vigorous exercise for 30 minutes per day most days of the week. We discussed the [employer] fitness program, which is designed for cancer survivors to help them become more physically fit after cancer treatments.  She was instructed to limit her alcohol consumption and continue to abstain from tobacco use.     8. Support services/counseling: It is not uncommon for this period of the patient's cancer care trajectory to be one of many emotions and stressors.  We discussed how this can be increasingly difficult during the times of quarantine and social distancing due to the COVID-19 pandemic.   She was given information regarding our available services and encouraged to contact me with any questions or for help enrolling in any of our support group/programs.    Follow up instructions:    -Return to cancer center 03/2020 for herceptin therapy and f/u with Dr. Lindi Adie  -Mammogram due in 03/2020 -Echo due 03/2020 -Port removal in 05/2020 -Follow up with surgery 08/2020 -She is welcome to return back to the Survivorship Clinic at any time; no additional follow-up needed at this time.  -Consider referral back to survivorship as a long-term survivor for continued surveillance  The patient was provided an opportunity to ask questions and all were answered. The patient agreed with the plan and demonstrated an understanding of the instructions.   Total encounter time: 45 minutes*  Wilber Bihari, NP 03/18/20 9:59 AM Medical Oncology and Hematology [facility] [place], [place] [address] Tel. [phone]    Fax. [phone]  *Total Encounter Time as defined by the Centers for Medicare and Medicaid Services includes, in addition to the face-to-face time of a patient visit (documented in the note above) non-face-to-face time: obtaining and reviewing outside history, ordering and reviewing medications, tests or procedures, care coordination (communications with  other health care professionals or caregivers) and documentation in the medical record.

## 2020-03-18 NOTE — Patient Instructions (Signed)
[facility] Discharge Instructions for Patients Receiving Chemotherapy  Today you received the following chemotherapy agents Trastuzumab (Kanjinti)  To help prevent nausea and vomiting after your treatment, we encourage you to take your nausea medication as directed.    If you develop nausea and vomiting that is not controlled by your nausea medication, call the clinic.   BELOW ARE SYMPTOMS THAT SHOULD BE REPORTED IMMEDIATELY:  *FEVER GREATER THAN 100.5 F  *CHILLS WITH OR WITHOUT FEVER  NAUSEA AND VOMITING THAT IS NOT CONTROLLED WITH YOUR NAUSEA MEDICATION  *UNUSUAL SHORTNESS OF BREATH  *UNUSUAL BRUISING OR BLEEDING  TENDERNESS IN MOUTH AND THROAT WITH OR WITHOUT PRESENCE OF ULCERS  *URINARY PROBLEMS  *BOWEL PROBLEMS  UNUSUAL RASH Items with * indicate a potential emergency and should be followed up as soon as possible.  Feel free to call the clinic should you have any questions or concerns. The clinic phone number is [phone].  Please show the CHEMO ALERT CARD at check-in to the Emergency Department and triage nurse.    

## 2020-03-21 ENCOUNTER — Telehealth: Payer: Self-pay | Admitting: Adult Health

## 2020-03-21 NOTE — Telephone Encounter (Signed)
No 6/18 los. No changes made to pt's schedule.

## 2020-03-24 ENCOUNTER — Encounter: Payer: Self-pay | Admitting: Licensed Clinical Social Worker

## 2020-03-24 NOTE — Progress Notes (Signed)
[facility] Quality of Life Screening Clinical Social Work  Clinical Social Work was referred by survivorship quality of life screening protocol.  The patient scored a 39.2 on the Quality of Life/ Cancer Survivor (QOL-CS) scale which indicates moderate quality of life.   Clinical Social Worker contacted patient by phone to assess for needs. Based on screener and patient report, adjusting to changes in appearance and self-concept and fear of future testing, recurrence, and 2nd cancer are concerns.  Ms. Hirschmann describes doing fairly well overall but going through some grief over the changes. CSW normalized and validated feelings and discussed ways to continue processing, including identifying what she learned that is and is not important to her. Only other area that patient is working on is weight so she can regain her energy. [facility] walking[facility] and drinking water. Discussed various resources that may be of assistance including Celebrate the Trail to Recovery (CTR), breast cancer support group, and FYNN. Ms. Ola will look into CTR and agreed to be added to the list for breast group and [facility].   Follow up plan: 1. Patient to continue engaging in activities that bring joy and continue healthy habits (exercise, drinking water) 2. Attend breast cancer support group & FYNN 3. Follow-up QOL screen to be sent in 1 months.     STOISITS, MICHELLE E, LCSW

## 2020-04-08 ENCOUNTER — Encounter: Payer: Self-pay | Admitting: *Deleted

## 2020-04-08 ENCOUNTER — Inpatient Hospital Stay: Payer: Medicare Other | Admitting: *Deleted

## 2020-04-08 ENCOUNTER — Inpatient Hospital Stay: Payer: Medicare Other | Attending: Hematology and Oncology

## 2020-04-08 ENCOUNTER — Other Ambulatory Visit: Payer: Self-pay

## 2020-04-08 VITALS — BP 143/73 | HR 70 | Temp 98.6°F | Resp 18 | Wt 169.8 lb

## 2020-04-08 DIAGNOSIS — C50411 Malignant neoplasm of upper-outer quadrant of right female breast: Secondary | ICD-10-CM | POA: Insufficient documentation

## 2020-04-08 DIAGNOSIS — C50211 Malignant neoplasm of upper-inner quadrant of right female breast: Secondary | ICD-10-CM

## 2020-04-08 DIAGNOSIS — Z79899 Other long term (current) drug therapy: Secondary | ICD-10-CM | POA: Insufficient documentation

## 2020-04-08 DIAGNOSIS — Z170 Estrogen receptor positive status [ER+]: Secondary | ICD-10-CM

## 2020-04-08 DIAGNOSIS — Z923 Personal history of irradiation: Secondary | ICD-10-CM | POA: Diagnosis not present

## 2020-04-08 DIAGNOSIS — Z5112 Encounter for antineoplastic immunotherapy: Secondary | ICD-10-CM | POA: Diagnosis present

## 2020-04-08 MED ORDER — TRASTUZUMAB-ANNS CHEMO 150 MG IV SOLR
450.0000 mg | Freq: Once | INTRAVENOUS | Status: AC
  Administered 2020-04-08: 450 mg via INTRAVENOUS
  Filled 2020-04-08: qty 21.43

## 2020-04-08 MED ORDER — ACETAMINOPHEN 325 MG PO TABS
650.0000 mg | ORAL_TABLET | Freq: Once | ORAL | Status: AC
  Administered 2020-04-08: 650 mg via ORAL

## 2020-04-08 MED ORDER — DIPHENHYDRAMINE HCL 25 MG PO CAPS
ORAL_CAPSULE | ORAL | Status: AC
  Filled 2020-04-08: qty 2

## 2020-04-08 MED ORDER — DIPHENHYDRAMINE HCL 25 MG PO CAPS
50.0000 mg | ORAL_CAPSULE | Freq: Once | ORAL | Status: AC
  Administered 2020-04-08: 50 mg via ORAL

## 2020-04-08 MED ORDER — ACETAMINOPHEN 325 MG PO TABS
ORAL_TABLET | ORAL | Status: AC
  Filled 2020-04-08: qty 2

## 2020-04-08 MED ORDER — SODIUM CHLORIDE 0.9% FLUSH
10.0000 mL | INTRAVENOUS | Status: DC | PRN
  Administered 2020-04-08: 10 mL
  Filled 2020-04-08: qty 10

## 2020-04-08 MED ORDER — HEPARIN SOD (PORK) LOCK FLUSH 100 UNIT/ML IV SOLN
500.0000 [IU] | Freq: Once | INTRAVENOUS | Status: AC | PRN
  Administered 2020-04-08: 500 [IU]
  Filled 2020-04-08: qty 5

## 2020-04-08 MED ORDER — SODIUM CHLORIDE 0.9 % IV SOLN
Freq: Once | INTRAVENOUS | Status: AC
  Filled 2020-04-08: qty 250

## 2020-04-08 NOTE — Patient Instructions (Signed)
[facility] Discharge Instructions for Patients Receiving Chemotherapy  Today you received the following chemotherapy agents Trastuzumab (Kanjinti)  To help prevent nausea and vomiting after your treatment, we encourage you to take your nausea medication as directed.    If you develop nausea and vomiting that is not controlled by your nausea medication, call the clinic.   BELOW ARE SYMPTOMS THAT SHOULD BE REPORTED IMMEDIATELY:  *FEVER GREATER THAN 100.5 F  *CHILLS WITH OR WITHOUT FEVER  NAUSEA AND VOMITING THAT IS NOT CONTROLLED WITH YOUR NAUSEA MEDICATION  *UNUSUAL SHORTNESS OF BREATH  *UNUSUAL BRUISING OR BLEEDING  TENDERNESS IN MOUTH AND THROAT WITH OR WITHOUT PRESENCE OF ULCERS  *URINARY PROBLEMS  *BOWEL PROBLEMS  UNUSUAL RASH Items with * indicate a potential emergency and should be followed up as soon as possible.  Feel free to call the clinic should you have any questions or concerns. The clinic phone number is [phone].  Please show the CHEMO ALERT CARD at check-in to the Emergency Department and triage nurse.    

## 2020-04-08 NOTE — Research (Signed)
04/08/2020 at 2:25pm - [facility] [id number]/[facility]BEAT - month 12 study assessments- The pt was into the [facility] for her infusion appt.  The pt met with the research nurse after her infusion appt to complete her month 12 assessments this morning.  The research nurse informed the pt that if she provided an email then she could complete her month 12 questionnaires (self-administered, [place], and the COVID questionnaire) at home to save time in the clinic. The pt said that she definitely wanted to complete her questionnaires by email.  The pt wrote down her email address for the nurse.  The research nurse entered the email address into [employer].  The nurse later confirmed with the study that the email was sent to the pt.  The pt was asked to complete the questionnaires by her 04/29/20. The pt reviewed her medication list with nurse.  The pt then completed her neurocognitive testing with Remer Macho, research specialist.  The pt then had her waist measurement obtained by the research nurse.  The pt's waist measurement was 37 inches.  The pt then completed her physical testing (6 minute walk, disabilities measures, and SPPB assessements) with the research nurse and Carol Ada, clinical research coordinator 1.  The pt denied any hospitalizations and cardiac events since her last visit. The pt was thanked for her continued support of this study.  The pt was informed that the rest of her month 12 assessments (labs, vitals, gift card) will be completed at her next study visit on 04/29/20.  The pt verbalized understanding.  The pt is aware that there is not a cardiac MRI done at the month 12 time point.   Brion Aliment RN, BSN, CCRP Clinical Research Nurse 04/08/2020 3:09 PM

## 2020-04-15 ENCOUNTER — Ambulatory Visit (HOSPITAL_COMMUNITY)
Admission: RE | Admit: 2020-04-15 | Discharge: 2020-04-15 | Disposition: A | Discharge status: Home / Self Care | Payer: Medicare Other | Source: Ambulatory Visit | Attending: Adult Health | Admitting: Adult Health

## 2020-04-15 ENCOUNTER — Other Ambulatory Visit: Payer: Self-pay

## 2020-04-15 DIAGNOSIS — C50411 Malignant neoplasm of upper-outer quadrant of right female breast: Secondary | ICD-10-CM

## 2020-04-15 DIAGNOSIS — Z170 Estrogen receptor positive status [ER+]: Secondary | ICD-10-CM | POA: Insufficient documentation

## 2020-04-15 DIAGNOSIS — Z01818 Encounter for other preprocedural examination: Secondary | ICD-10-CM | POA: Insufficient documentation

## 2020-04-15 DIAGNOSIS — I119 Hypertensive heart disease without heart failure: Secondary | ICD-10-CM | POA: Diagnosis not present

## 2020-04-15 LAB — ECHOCARDIOGRAM COMPLETE
Area-P 1/2: 2.13 cm2
S' Lateral: 3.1 cm

## 2020-04-15 NOTE — Progress Notes (Signed)
  Echocardiogram 2D Echocardiogram has been performed.  Jennette Dubin 04/15/2020, 10:48 AM

## 2020-04-19 LAB — HM MAMMOGRAPHY

## 2020-04-20 ENCOUNTER — Encounter: Payer: Self-pay | Admitting: Nurse Practitioner

## 2020-04-20 ENCOUNTER — Other Ambulatory Visit: Payer: Self-pay

## 2020-04-21 ENCOUNTER — Encounter: Payer: Medicare Other | Admitting: Nurse Practitioner

## 2020-04-27 ENCOUNTER — Other Ambulatory Visit: Payer: Self-pay | Admitting: Nurse Practitioner

## 2020-04-27 DIAGNOSIS — E7800 Pure hypercholesterolemia, unspecified: Secondary | ICD-10-CM

## 2020-04-27 DIAGNOSIS — I10 Essential (primary) hypertension: Secondary | ICD-10-CM

## 2020-04-28 ENCOUNTER — Other Ambulatory Visit: Payer: Self-pay

## 2020-04-28 DIAGNOSIS — C50411 Malignant neoplasm of upper-outer quadrant of right female breast: Secondary | ICD-10-CM

## 2020-04-28 DIAGNOSIS — Z170 Estrogen receptor positive status [ER+]: Secondary | ICD-10-CM

## 2020-04-28 NOTE — Progress Notes (Signed)
Patient Care Team: Minette Brine, FNP as PCP - General (General Practice) Nicholas Lose, MD as Consulting Physician (Hematology and Oncology) Rolm Bookbinder, MD as Consulting Physician (General Surgery) Eppie Gibson, MD as Attending Physician (Radiation Oncology)  DIAGNOSIS:    ICD-10-CM   1. Malignant neoplasm of upper-outer quadrant of right breast in female, estrogen receptor positive ([place])  C50.411    Z17.0     SUMMARY OF ONCOLOGIC HISTORY: Oncology History  Malignant neoplasm of upper-outer quadrant of right breast in female, estrogen receptor positive ([place])  04/02/2019 Initial Diagnosis   Screening mammogram detected a mass in the right axilla. [place] confirmed a 0.9cm right breast mass. Biopsy showed IDC, grade 2, HER-2+ (3+), ER+ 100%, PR+ 5%, Ki67 15%.    04/08/2019 Cancer Staging   Staging form: Breast, AJCC 8th Edition - Clinical stage from 04/08/2019: Stage IA (cT1b, cN0, cM0, G2, ER+, PR+, HER2+) - Signed by Nicholas Lose, MD on 04/08/2019   04/14/2019 Genetic Testing   Patient had genetic testing done for her personal and family history of breast cancer.  Negative genetic testing on the [facility] Breast Cancer STAT panel. The STAT Breast cancer panel offered by [facility] includes sequencing and rearrangement analysis for the following 9 genes:  ATM, BRCA1, BRCA2, CDH1, CHEK2, PALB2, PTEN, STK11 and TP53.  The report date is 04/14/2019.   04/17/2019 Surgery   Right lumpectomy [name]): IDC, 2.0cm, grade 2, clear margins, 5 axillary lymph nodes negative for carcinoma   08/24/2019 - 09/21/2019 Radiation Therapy   Adjuvant radiation   10/01/2019 -  Anti-estrogen oral therapy   Anastrozole daily   Carcinoma of upper-inner quadrant of right breast in female, estrogen receptor positive ([place])  04/08/2019 Cancer Staging   Staging form: Breast, AJCC 8th Edition - Clinical stage from 04/08/2019: Stage IA (cT1b, cN0, cM0, G2, ER+, PR+, HER2+) - Signed by Eppie Gibson, MD on 04/08/2019     05/15/2019 -  Chemotherapy   The patient had PACLitaxel (TAXOL) 144 mg in sodium chloride 0.9 % 250 mL chemo infusion (</= 16m/m2), 80 mg/m2 = 144 mg, Intravenous,  Once, 3 of 3 cycles Administration: 144 mg (05/15/2019), 144 mg (05/22/2019), 144 mg (06/12/2019), 144 mg (05/29/2019), 144 mg (06/05/2019), 144 mg (06/19/2019), 144 mg (06/26/2019), 144 mg (07/03/2019), 144 mg (07/10/2019), 144 mg (07/17/2019), 144 mg (07/24/2019), 144 mg (07/31/2019) trastuzumab-anns (KANJINTI) 300 mg in sodium chloride 0.9 % 250 mL chemo infusion, 294 mg (100 % of original dose 4 mg/kg), Intravenous,  Once, 15 of 16 cycles Dose modification: 4 mg/kg (original dose 4 mg/kg, Cycle 1, Reason: Other (see comments), Comment: load), 6 mg/kg (original dose 2 mg/kg, Cycle 3, Reason: Other (see comments), Comment: q3wk dosing), 2 mg/kg (original dose 2 mg/kg, Cycle 3, Reason: Other (see comments)), 450 mg (original dose 2 mg/kg, Cycle 3, Reason: Other (see comments), Comment: Maintenance dosing), 450 mg (original dose 6 mg/kg, Cycle 4, Reason: Other (see comments), Comment: rounded; vial size), 6 mg/kg (original dose 2 mg/kg, Cycle 12, Reason: Other (see comments), Comment: pt on q3weeks), 6 mg/kg (original dose 2 mg/kg, Cycle 15, Reason: Other (see comments), Comment: dosing is q3weeks) Administration: 300 mg (05/15/2019), 150 mg (05/22/2019), 150 mg (05/29/2019), 150 mg (06/05/2019), 150 mg (06/12/2019), 150 mg (06/19/2019), 150 mg (06/26/2019), 150 mg (07/03/2019), 150 mg (07/10/2019), 150 mg (07/17/2019), 150 mg (07/24/2019), 450 mg (07/31/2019), 450 mg (08/21/2019), 450 mg (09/11/2019), 450 mg (10/01/2019), 450 mg (11/13/2019), 450 mg (12/04/2019), 450 mg (02/05/2020), 450 mg (02/26/2020), 450 mg (12/25/2019), 450 mg (10/23/2019),  450 mg (01/15/2020), 450 mg (03/18/2020), 450 mg (04/08/2020)  for chemotherapy treatment.      CHIEF COMPLIANT: Follow-upof right breast cancer on Herceptin and anastrozole  INTERVAL HISTORY: Emily Ross is a 64 y.o. with  above-mentioned history of breast cancerwho underwent alumpectomy,adjuvant chemotherapy, andradiation.She is a participant in the [employer] clinical trial.She is currently on treatment with Herceptin and anastrozole.Echo on 04/15/20 showed an ejection fraction of 60-65%. She presents to the clinic todayfor treatment.   ALLERGIES:  has No Known Allergies.  MEDICATIONS:  Current Outpatient Medications  Medication Sig Dispense Refill  . anastrozole (ARIMIDEX) 1 MG tablet Take 1 tablet (1 mg total) by mouth daily. 90 tablet 3  . carboxymethylcellul-glycerin (LUBRICATING EYE DROPS) 0.5-0.9 % ophthalmic solution Place 1 drop into both eyes as needed (dry eyes). 30 mL 1  . Cholecalciferol (DIALYVITE VITAMIN D 5000) 125 MCG (5000 UT) capsule Take 5,000 Units by mouth daily.    . diphenhydrAMINE (BENADRYL) 25 MG tablet Take 25 mg by mouth daily as needed for allergies.    . hydrochlorothiazide (HYDRODIURIL) 25 MG tablet TAKE ONE TABLET BY MOUTH ONE TIME DAILY 90 tablet 0  . ibuprofen (ADVIL) 600 MG tablet Take 1 tablet (600 mg total) by mouth every 6 (six) hours as needed. 30 tablet 2  . lidocaine-prilocaine (EMLA) cream Apply to affected area once 30 g 3  . metoprolol tartrate (LOPRESSOR) 50 MG tablet TAKE ONE TABLET BY MOUTH TWICE DAILY 180 tablet 0  . Red Yeast Rice Extract (RED YEAST RICE PO) Take 2 capsules by mouth daily.    . simvastatin (ZOCOR) 10 MG tablet TAKE ONE TABLET BY MOUTH DAILY IN THE EVENING 90 tablet 0   No current facility-administered medications for this visit.    PHYSICAL EXAMINATION: ECOG PERFORMANCE STATUS: 1 - Symptomatic but completely ambulatory  Vitals:   04/29/20 1058  BP: (!) 138/91  Pulse: 57  Temp: 97.8 F (36.6 C)  SpO2: 100%   Filed Weights   04/29/20 1058  Weight: 166 lb 12.8 oz (75.7 kg)    LABORATORY DATA:  I have reviewed the data as listed CMP Latest Ref Rng & Units 03/18/2020 02/05/2020 12/25/2019  Glucose 70 - 99 mg/dL 91 101(H) 106(H)  BUN 8  - 23 mg/dL _0 Creatinine 0.44 - 1.00 mg/dL 0.82 0.80 0.86  Sodium 135 - 145 mmol/L 138 140 138  Potassium 3.5 - 5.1 mmol/L 3.4(L) 3.7 3.4(L)  Chloride 98 - 111 mmol/L 103 102 101  CO2 22 - 32 mmol/L _1 Calcium 8.9 - 10.3 mg/dL 9.8 9.8 9.9  Total Protein 6.5 - 8.1 g/dL 7.2 7.4 7.5  Total Bilirubin 0.3 - 1.2 mg/dL 0.3 0.5 0.3  Alkaline Phos 38 - 126 U/L 64 64 61  AST 15 - 41 U/L _2 ALT 0 - 44 U/L _3 Lab Results  Component Value Date   WBC 2.9 (L) 04/29/2020   HGB 11.6 (L) 04/29/2020   HCT 34.5 (L) 04/29/2020   MCV 92.7 04/29/2020   PLT 232 04/29/2020   NEUTROABS 1.6 (L) 04/29/2020    ASSESSMENT & PLAN:  Malignant neoplasm of upper-outer quadrant of right breast in female, estrogen receptor positive ([place]) 04/02/2019:Screening mammogram detected a mass in the right axilla. [place] confirmed a 0.9cm right breast mass. Biopsy showed IDC, grade 2, HER-2+ (3+), ER+ 100%, PR+ 5%, Ki67 15%.  Treatment plan: 1.Breast conserving surgery with sentinel lymph node biopsy 2.Adjuvant chemotherapy  with Taxol Herceptin weekly x12 followed by Herceptin maintenance for 1 year started8/14/2020 3.Adjuvant radiation therapywill be starting 08/22/2019-09/21/2019 4.Followed by adjuvant antiestrogen therapy with anastrozole 1 mg daily x5 to 7 yearsstarted2/06/2019 ------------------------------------------------------------------------------------------------------------------------------------------- Current Treatment:Herceptin maintenance therapy(to be completed July 2021) with anastrozole No adverse effects to Herceptin Today she completes her Herceptin treatment. She has an appointment to get the port removed next Tuesday. Anastrozole:started2/06/2020  Breast cancer surveillance: Mammogram July 2021: Benign  Anastrozole toxicities: Denies any hot flashes or myalgias. Return to clinic in 6 months for follow-up    No orders of the defined types were  placed in this encounter.  The patient has a good understanding of the overall plan. she agrees with it. she will call with any problems that may develop before the next visit here.  Total time spent: 30 mins including face to face time and time spent for planning, charting and coordination of care  Nicholas Lose, MD 04/29/2020  I, Cloyde Reams Dorshimer, am acting as scribe for Dr. Nicholas Lose.  I have reviewed the above documentation for accuracy and completeness, and I agree with the above.

## 2020-04-29 ENCOUNTER — Inpatient Hospital Stay: Payer: Medicare Other

## 2020-04-29 ENCOUNTER — Inpatient Hospital Stay (HOSPITAL_BASED_OUTPATIENT_CLINIC_OR_DEPARTMENT_OTHER): Payer: Medicare Other | Admitting: Hematology and Oncology

## 2020-04-29 ENCOUNTER — Encounter: Payer: Self-pay | Admitting: *Deleted

## 2020-04-29 ENCOUNTER — Other Ambulatory Visit: Payer: Self-pay

## 2020-04-29 DIAGNOSIS — Z170 Estrogen receptor positive status [ER+]: Secondary | ICD-10-CM

## 2020-04-29 DIAGNOSIS — Z95828 Presence of other vascular implants and grafts: Secondary | ICD-10-CM

## 2020-04-29 DIAGNOSIS — C50211 Malignant neoplasm of upper-inner quadrant of right female breast: Secondary | ICD-10-CM

## 2020-04-29 DIAGNOSIS — C50411 Malignant neoplasm of upper-outer quadrant of right female breast: Secondary | ICD-10-CM

## 2020-04-29 DIAGNOSIS — Z5112 Encounter for antineoplastic immunotherapy: Secondary | ICD-10-CM | POA: Diagnosis not present

## 2020-04-29 LAB — CMP (CANCER CENTER ONLY)
ALT: 15 U/L (ref 0–44)
AST: 18 U/L (ref 15–41)
Albumin: 3.9 g/dL (ref 3.5–5.0)
Alkaline Phosphatase: 61 U/L (ref 38–126)
Anion gap: 8 (ref 5–15)
BUN: 18 mg/dL (ref 8–23)
CO2: 28 mmol/L (ref 22–32)
Calcium: 10.6 mg/dL — ABNORMAL HIGH (ref 8.9–10.3)
Chloride: 102 mmol/L (ref 98–111)
Creatinine: 0.81 mg/dL (ref 0.44–1.00)
GFR, Est AFR Am: >60 mL/min (ref >60)
GFR, Estimated: >60 mL/min (ref >60)
Glucose, Bld: 98 mg/dL (ref 70–99)
Potassium: 3.4 mmol/L — ABNORMAL LOW (ref 3.5–5.1)
Sodium: 138 mmol/L (ref 135–145)
Total Bilirubin: 0.5 mg/dL (ref 0.3–1.2)
Total Protein: 7.4 g/dL (ref 6.5–8.1)

## 2020-04-29 LAB — CBC WITH DIFFERENTIAL (CANCER CENTER ONLY)
Abs Immature Granulocytes: 0 10*3/uL (ref 0.00–0.07)
Basophils Absolute: 0 10*3/uL (ref 0.0–0.1)
Basophils Relative: 1 %
Eosinophils Absolute: 0.1 10*3/uL (ref 0.0–0.5)
Eosinophils Relative: 2 %
HCT: 34.5 % — ABNORMAL LOW (ref 36.0–46.0)
Hemoglobin: 11.6 g/dL — ABNORMAL LOW (ref 12.0–15.0)
Immature Granulocytes: 0 %
Lymphocytes Relative: 35 %
Lymphs Abs: 1 10*3/uL (ref 0.7–4.0)
MCH: 31.2 pg (ref 26.0–34.0)
MCHC: 33.6 g/dL (ref 30.0–36.0)
MCV: 92.7 fL (ref 80.0–100.0)
Monocytes Absolute: 0.2 10*3/uL (ref 0.1–1.0)
Monocytes Relative: 8 %
Neutro Abs: 1.6 10*3/uL — ABNORMAL LOW (ref 1.7–7.7)
Neutrophils Relative %: 54 %
Platelet Count: 232 10*3/uL (ref 150–400)
RBC: 3.72 MIL/uL — ABNORMAL LOW (ref 3.87–5.11)
RDW: 12.9 % (ref 11.5–15.5)
WBC Count: 2.9 10*3/uL — ABNORMAL LOW (ref 4.0–10.5)
nRBC: 0 % (ref 0.0–0.2)

## 2020-04-29 LAB — RESEARCH LABS

## 2020-04-29 MED ORDER — SODIUM CHLORIDE 0.9% FLUSH
10.0000 mL | INTRAVENOUS | Status: DC | PRN
  Administered 2020-04-29: 10 mL
  Filled 2020-04-29: qty 10

## 2020-04-29 MED ORDER — ACETAMINOPHEN 325 MG PO TABS
650.0000 mg | ORAL_TABLET | Freq: Once | ORAL | Status: AC
  Administered 2020-04-29: 650 mg via ORAL

## 2020-04-29 MED ORDER — DIPHENHYDRAMINE HCL 25 MG PO CAPS
ORAL_CAPSULE | ORAL | Status: AC
  Filled 2020-04-29: qty 2

## 2020-04-29 MED ORDER — DIPHENHYDRAMINE HCL 25 MG PO CAPS
50.0000 mg | ORAL_CAPSULE | Freq: Once | ORAL | Status: AC
  Administered 2020-04-29: 50 mg via ORAL

## 2020-04-29 MED ORDER — TRASTUZUMAB-ANNS CHEMO 150 MG IV SOLR
450.0000 mg | Freq: Once | INTRAVENOUS | Status: AC
  Administered 2020-04-29: 450 mg via INTRAVENOUS
  Filled 2020-04-29: qty 21.43

## 2020-04-29 MED ORDER — ACETAMINOPHEN 325 MG PO TABS
ORAL_TABLET | ORAL | Status: AC
  Filled 2020-04-29: qty 2

## 2020-04-29 MED ORDER — HEPARIN SOD (PORK) LOCK FLUSH 100 UNIT/ML IV SOLN
500.0000 [IU] | Freq: Once | INTRAVENOUS | Status: AC | PRN
  Administered 2020-04-29: 500 [IU]
  Filled 2020-04-29: qty 5

## 2020-04-29 MED ORDER — SODIUM CHLORIDE 0.9 % IV SOLN
Freq: Once | INTRAVENOUS | Status: AC
  Filled 2020-04-29: qty 250

## 2020-04-29 NOTE — Assessment & Plan Note (Signed)
04/02/2019:Screening mammogram detected a mass in the right axilla. US confirmed a 0.9cm right breast mass. Biopsy showed IDC, grade 2, HER-2+ (3+), ER+ 100%, PR+ 5%, Ki67 15%.  Treatment plan: 1.Breast conserving surgery with sentinel lymph node biopsy 2.Adjuvant chemotherapy with Taxol Herceptin weekly x12 followed by Herceptin maintenance for 1 year started8/14/2020 3.Adjuvant radiation therapywill be starting 08/22/2019-09/21/2019 4.Followed by adjuvant antiestrogen therapy with anastrozole 1 mg daily x5 to 7 yearsstarted2/06/2019 ------------------------------------------------------------------------------------------------------------------------------------------- Current Treatment:Herceptin maintenance therapy (to be completed July 2021) with anastrozole No adverse effects to Herceptin Anastrozole: started 11/10/2019  Anastrozole toxicities: Denies any hot flashes or myalgias. Return to clinic every 3 weeks for Herceptin every 6 weeks of follow-up with me. 

## 2020-04-29 NOTE — Patient Instructions (Signed)
[facility] Discharge Instructions for Patients Receiving Chemotherapy  Today you received the following chemotherapy agents Trastuzumab (Kanjinti)  To help prevent nausea and vomiting after your treatment, we encourage you to take your nausea medication as directed.    If you develop nausea and vomiting that is not controlled by your nausea medication, call the clinic.   BELOW ARE SYMPTOMS THAT SHOULD BE REPORTED IMMEDIATELY:  *FEVER GREATER THAN 100.5 F  *CHILLS WITH OR WITHOUT FEVER  NAUSEA AND VOMITING THAT IS NOT CONTROLLED WITH YOUR NAUSEA MEDICATION  *UNUSUAL SHORTNESS OF BREATH  *UNUSUAL BRUISING OR BLEEDING  TENDERNESS IN MOUTH AND THROAT WITH OR WITHOUT PRESENCE OF ULCERS  *URINARY PROBLEMS  *BOWEL PROBLEMS  UNUSUAL RASH Items with * indicate a potential emergency and should be followed up as soon as possible.  Feel free to call the clinic should you have any questions or concerns. The clinic phone number is [phone].  Please show the CHEMO ALERT CARD at check-in to the Emergency Department and triage nurse.    

## 2020-05-02 ENCOUNTER — Encounter: Payer: Self-pay | Admitting: Radiology

## 2020-05-02 DIAGNOSIS — C50211 Malignant neoplasm of upper-inner quadrant of right female breast: Secondary | ICD-10-CM

## 2020-05-02 DIAGNOSIS — Z170 Estrogen receptor positive status [ER+]: Secondary | ICD-10-CM

## 2020-05-02 NOTE — Research (Signed)
UPBEAT [id number] - UNDERSTANDING AND PREDICTING BREAST CANCER EVENTS AFTER TREATMENT  LATE NOTE FROM 04/29/20  12 MONTH VISIT: Met with patient along with Remer Macho, Clinical Research Specialist. We confirmed patient got her lab work completed and had 2 sets of vitals measured 1 minute apart. After finishing all tasks for her 12 month visit, patient was given the $25 gift card. Aleida was thanked for her time.  Caspian RT(R)(T) Scientist, physiological

## 2020-05-03 ENCOUNTER — Telehealth: Payer: Self-pay | Admitting: Hematology and Oncology

## 2020-05-03 NOTE — Telephone Encounter (Signed)
Scheduled per 7/30 los. Called and spoke with pt, confirmed 1/28 appt

## 2020-05-10 ENCOUNTER — Encounter: Payer: Self-pay | Admitting: Nurse Practitioner

## 2020-05-10 ENCOUNTER — Ambulatory Visit (INDEPENDENT_AMBULATORY_CARE_PROVIDER_SITE_OTHER): Payer: Medicare Other | Admitting: Nurse Practitioner

## 2020-05-10 ENCOUNTER — Other Ambulatory Visit: Payer: Self-pay

## 2020-05-10 VITALS — BP 122/78 | HR 83 | Temp 98.5°F | Ht 66.5 in | Wt 167.4 lb

## 2020-05-10 DIAGNOSIS — Z23 Encounter for immunization: Secondary | ICD-10-CM

## 2020-05-10 DIAGNOSIS — Z0000 Encounter for general adult medical examination without abnormal findings: Secondary | ICD-10-CM

## 2020-05-10 DIAGNOSIS — C50211 Malignant neoplasm of upper-inner quadrant of right female breast: Secondary | ICD-10-CM | POA: Diagnosis not present

## 2020-05-10 DIAGNOSIS — Z114 Encounter for screening for human immunodeficiency virus [HIV]: Secondary | ICD-10-CM

## 2020-05-10 DIAGNOSIS — Z170 Estrogen receptor positive status [ER+]: Secondary | ICD-10-CM

## 2020-05-10 DIAGNOSIS — E559 Vitamin D deficiency, unspecified: Secondary | ICD-10-CM

## 2020-05-10 DIAGNOSIS — E7800 Pure hypercholesterolemia, unspecified: Secondary | ICD-10-CM | POA: Diagnosis not present

## 2020-05-10 DIAGNOSIS — R7303 Prediabetes: Secondary | ICD-10-CM | POA: Diagnosis not present

## 2020-05-10 DIAGNOSIS — I10 Essential (primary) hypertension: Secondary | ICD-10-CM

## 2020-05-10 LAB — POCT URINALYSIS DIPSTICK
Bilirubin, UA: NEGATIVE
Glucose, UA: NEGATIVE
Ketones, UA: NEGATIVE
Leukocytes, UA: NEGATIVE
Nitrite, UA: NEGATIVE
Protein, UA: NEGATIVE
Spec Grav, UA: 1.01 (ref 1.010–1.025)
Urobilinogen, UA: 0.2 E.U./dL
pH, UA: 7 (ref 5.0–8.0)

## 2020-05-10 LAB — POCT UA - MICROALBUMIN
Albumin/Creatinine Ratio, Urine, POC: 30
Creatinine, POC: 50 mg/dL
Microalbumin Ur, POC: 10 mg/L

## 2020-05-10 MED ORDER — PNEUMOCOCCAL 13-VAL CONJ VACC IM SUSP: 0.5000 mL | INTRAMUSCULAR | 0 refills | Status: AC

## 2020-05-10 NOTE — Progress Notes (Signed)
This visit occurred during the SARS-CoV-2 public health emergency.  Safety protocols were in place, including screening questions prior to the visit, additional usage of staff PPE, and extensive cleaning of exam room while observing appropriate contact time as indicated for disinfecting solutions.  Subjective:     Patient ID: Emily Ross , female    DOB: May 19, 1956 , 64 y.o.   MRN: [id number]   Chief Complaint  Patient presents with  . Medicare Wellness    HPI  Here for Medicare AWV.    She had her port removed last week. Her last chemo treatment was last month.      Past Medical History:  Diagnosis Date  . Breast cancer ([place])   . Cancer [facility])    2009 left breast 2020 right breast  . Family history of breast cancer   . Family history of stomach cancer   . High cholesterol   . History of radiation therapy 08/23/19- 09/21/19   Right breast 15 fx of 2.67 Gy to total 40.05 Gy. Right Breast boost 5 fx of 2 Gy each to total 10 Gy  . History of seasonal allergies   . Hypertension   . Pneumonia   . Wears glasses      Family History  Problem Relation Age of Onset  . Diabetes Father   . Hypertension Father   . Stomach cancer Father 18  . Diabetes Mother   . Hypertension Mother   . Breast cancer Sister 51       negative genetic testing  . Breast cancer Maternal Aunt 80  . Colon cancer Neg Hx   . Esophageal cancer Neg Hx   . Rectal cancer Neg Hx      Current Outpatient Medications:  .  anastrozole (ARIMIDEX) 1 MG tablet, Take 1 tablet (1 mg total) by mouth daily., Disp: 90 tablet, Rfl: 3 .  carboxymethylcellul-glycerin (LUBRICATING EYE DROPS) 0.5-0.9 % ophthalmic solution, Place 1 drop into both eyes as needed (dry eyes)., Disp: 30 mL, Rfl: 1 .  Cholecalciferol (DIALYVITE VITAMIN D 5000) 125 MCG (5000 UT) capsule, Take 5,000 Units by mouth daily., Disp: , Rfl:  .  diphenhydrAMINE (BENADRYL) 25 MG tablet, Take 25 mg by mouth daily as needed for allergies., Disp: , Rfl:   .  hydrochlorothiazide (HYDRODIURIL) 25 MG tablet, TAKE ONE TABLET BY MOUTH ONE TIME DAILY, Disp: 90 tablet, Rfl: 0 .  ibuprofen (ADVIL) 600 MG tablet, Take 1 tablet (600 mg total) by mouth every 6 (six) hours as needed., Disp: 30 tablet, Rfl: 2 .  metoprolol tartrate (LOPRESSOR) 50 MG tablet, TAKE ONE TABLET BY MOUTH TWICE DAILY, Disp: 180 tablet, Rfl: 0 .  Red Yeast Rice Extract (RED YEAST RICE PO), Take 2 capsules by mouth daily., Disp: , Rfl:  .  simvastatin (ZOCOR) 10 MG tablet, TAKE ONE TABLET BY MOUTH DAILY IN THE EVENING, Disp: 90 tablet, Rfl: 0 .  lidocaine-prilocaine (EMLA) cream, Apply to affected area once (Patient not taking: Reported on 05/10/2020), Disp: 30 g, Rfl: 3   No Known Allergies   Review of Systems  Constitutional: Negative.   HENT: Negative.   Eyes: Negative.   Respiratory: Negative.   Cardiovascular: Negative.   Gastrointestinal: Negative.   Endocrine: Negative.   Genitourinary: Negative.   Musculoskeletal: Negative.   Skin: Negative.   Allergic/Immunologic: Negative.   Neurological: Negative.   Hematological: Negative.   Psychiatric/Behavioral: Negative.      Today's Vitals   05/10/20 1422  BP: 122/78  Pulse: 83  Temp: 98.5 F (36.9 C)  TempSrc: Oral  SpO2: 98%  Weight: 167 lb 6.4 oz (75.9 kg)  Height: 5' 6.5" (1.689 m)  PainSc: 0-No pain   Body mass index is 26.61 kg/m.   Objective:  Physical Exam Constitutional:      General: She is not in acute distress.    Appearance: Normal appearance. She is well-developed. She is obese.  HENT:     Head: Normocephalic and atraumatic.     Right Ear: Hearing, tympanic membrane, ear canal and external ear normal. There is no impacted cerumen.     Left Ear: Hearing, tympanic membrane, ear canal and external ear normal. There is no impacted cerumen.     Nose: Nose normal.     Mouth/Throat:     Mouth: Mucous membranes are dry.  Eyes:     General: Lids are normal.     Extraocular Movements: Extraocular  movements intact.     Conjunctiva/sclera: Conjunctivae normal.     Pupils: Pupils are equal, round, and reactive to light.     Funduscopic exam:    Right eye: No papilledema.        Left eye: No papilledema.  Neck:     Thyroid: No thyroid mass.     Vascular: No carotid bruit.  Cardiovascular:     Rate and Rhythm: Normal rate and regular rhythm.     Pulses: Normal pulses.     Heart sounds: Normal heart sounds. No murmur heard.   Pulmonary:     Effort: Pulmonary effort is normal.     Breath sounds: Normal breath sounds.  Abdominal:     General: Abdomen is flat. Bowel sounds are normal. There is no distension.     Palpations: Abdomen is soft.     Tenderness: There is no abdominal tenderness.  Genitourinary:    Rectum: Guaiac result negative.  Musculoskeletal:        General: No swelling. Normal range of motion.     Cervical back: Full passive range of motion without pain, normal range of motion and neck supple.     Right lower leg: No edema.     Left lower leg: No edema.  Skin:    General: Skin is warm and dry.     Capillary Refill: Capillary refill takes less than 2 seconds.  Neurological:     General: No focal deficit present.     Mental Status: She is alert and oriented to person, place, and time.     Cranial Nerves: No cranial nerve deficit.     Sensory: No sensory deficit.  Psychiatric:        Mood and Affect: Mood normal.        Behavior: Behavior normal.        Thought Content: Thought content normal.        Judgment: Judgment normal.         Assessment And Plan:     1. Encounter for Medicare annual wellness exam  Pt's annual wellness exam was performed and geriatric assessment reviewed.   Pt has no new identiafble wellness concerns at this time.   WIll obtain routine labs.   Will obtain UA and micro.   Behavior modifications discussed and diet history reviewed. Pt will continue to exercise regularly and modify diet, with low GI, plant based foods and  decrease food intake of processed foods.   Recommend intake of daily multivitamin, Vitamin D, and calcium.  Recommond mammogram and colonoscopy (up to date) for preventive  screenings, as well as recommend immunizations that include influenza (up to date) and TDAP  2. Encounter for HIV (human immunodeficiency virus) test - HIV antibody (with reflex)  3. Essential hypertension . B/P is well controlled.  . CMP ordered to check renal function.  . The importance of regular exercise and dietary modification was stressed to the patient.  . EKG done with NSR with HR 67 - CMP14+EGFR - EKG 12-Lead  4. Prediabetes  Chronic, stable  no current medications  Encouraged to limit intake of sugary foods and drinks  Encouraged to increase physical activity to 150 minutes per week as tolerated - Hemoglobin A1c  5. Elevated LDL cholesterol level  Chronic, controlled  Continue with current medications, tolerating medications well - CBC - Lipid panel  6. Carcinoma of upper-inner quadrant of right breast in female, estrogen receptor positive ([place])  Currently being followed by Oncology with Dr. Lindi Adie  7. Vitamin D deficiency  Will check vitamin D level and supplement as needed.     Also encouraged to spend 15 minutes in the sun daily.  - VITAMIN D 25 Hydroxy (Vit-D Deficiency, Fractures)  8. Encounter for immunization  Sent to pharmacy - pneumococcal 13-valent conjugate vaccine (PREVNAR 13) SUSP injection; Inject 0.5 mLs into the muscle tomorrow at 10 am for 1 dose.  Dispense: 0.5 mL; Refill: 0     Patient was given opportunity to ask questions. Patient verbalized understanding of the plan and was able to repeat key elements of the plan. All questions were answered to their satisfaction.   Teola Bradley, FNP, have reviewed all documentation for this visit. The documentation on 06/26/20 for the exam, diagnosis, procedures, and orders are all accurate and complete.  THE PATIENT IS  ENCOURAGED TO PRACTICE SOCIAL DISTANCING DUE TO THE COVID-19 PANDEMIC.     Subjective:    Emily Ross is a 64 y.o. female who presents for a Welcome to Medicare exam.   Review of Systems  Cardiac Risk Factors include: dyslipidemia      Objective:    Today's Vitals   05/10/20 1422  BP: 122/78  Pulse: 83  Temp: 98.5 F (36.9 C)  TempSrc: Oral  SpO2: 98%  Weight: 167 lb 6.4 oz (75.9 kg)  Height: 5' 6.5" (1.689 m)  PainSc: 0-No pain  Body mass index is 26.61 kg/m.  Medications Outpatient Encounter Medications as of 05/10/2020  Medication Sig  . anastrozole (ARIMIDEX) 1 MG tablet Take 1 tablet (1 mg total) by mouth daily.  . carboxymethylcellul-glycerin (LUBRICATING EYE DROPS) 0.5-0.9 % ophthalmic solution Place 1 drop into both eyes as needed (dry eyes).  . Cholecalciferol (DIALYVITE VITAMIN D 5000) 125 MCG (5000 UT) capsule Take 5,000 Units by mouth daily.  . diphenhydrAMINE (BENADRYL) 25 MG tablet Take 25 mg by mouth daily as needed for allergies.  . hydrochlorothiazide (HYDRODIURIL) 25 MG tablet TAKE ONE TABLET BY MOUTH ONE TIME DAILY  . ibuprofen (ADVIL) 600 MG tablet Take 1 tablet (600 mg total) by mouth every 6 (six) hours as needed.  . metoprolol tartrate (LOPRESSOR) 50 MG tablet TAKE ONE TABLET BY MOUTH TWICE DAILY  . Red Yeast Rice Extract (RED YEAST RICE PO) Take 2 capsules by mouth daily.  . simvastatin (ZOCOR) 10 MG tablet TAKE ONE TABLET BY MOUTH DAILY IN THE EVENING  . lidocaine-prilocaine (EMLA) cream Apply to affected area once (Patient not taking: Reported on 05/10/2020)  . [EXPIRED] pneumococcal 13-valent conjugate vaccine (PREVNAR 13) SUSP injection Inject 0.5 mLs into the muscle  tomorrow at 10 am for 1 dose.   No facility-administered encounter medications on file as of 05/10/2020.     History: Past Medical History:  Diagnosis Date  . Breast cancer ([place])   . Cancer [facility])    2009 left breast 2020 right breast  . Family history of breast cancer   .  Family history of stomach cancer   . High cholesterol   . History of radiation therapy 08/23/19- 09/21/19   Right breast 15 fx of 2.67 Gy to total 40.05 Gy. Right Breast boost 5 fx of 2 Gy each to total 10 Gy  . History of seasonal allergies   . Hypertension   . Pneumonia   . Wears glasses    Past Surgical History:  Procedure Laterality Date  . AXILLARY SENTINEL NODE BIOPSY Right 04/17/2019   Procedure: Right Axillary Sentinel Node Biopsy;  Surgeon: Rolm Bookbinder, MD;  Location: [facility];  Service: General;  Laterality: Right;  . BREAST LUMPECTOMY    . BREAST LUMPECTOMY WITH RADIOACTIVE SEED AND SENTINEL LYMPH NODE BIOPSY Right 04/17/2019   Procedure: RIGHT BREAST LUMPECTOMY WITH RADIOACTIVE SEED;  Surgeon: Rolm Bookbinder, MD;  Location: [facility];  Service: General;  Laterality: Right;  . COLONOSCOPY    . PORTACATH PLACEMENT Right 04/17/2019   Procedure: INSERTION PORT-A-CATH WITH ULTRASOUND;  Surgeon: Rolm Bookbinder, MD;  Location: [facility];  Service: General;  Laterality: Right;  . TUBAL LIGATION    . WISDOM TOOTH EXTRACTION      Family History  Problem Relation Age of Onset  . Diabetes Father   . Hypertension Father   . Stomach cancer Father 61  . Diabetes Mother   . Hypertension Mother   . Breast cancer Sister 3       negative genetic testing  . Breast cancer Maternal Aunt 80  . Colon cancer Neg Hx   . Esophageal cancer Neg Hx   . Rectal cancer Neg Hx    Social History   Occupational History  . Not on file  Tobacco Use  . Smoking status: Never Smoker  . Smokeless tobacco: Never Used  Vaping Use  . Vaping Use: Never used  Substance and Sexual Activity  . Alcohol use: No  . Drug use: No  . Sexual activity: Yes    Birth control/protection: Post-menopausal, None    Tobacco Counseling Counseling given: Not Answered   Immunizations and Health Maintenance Immunization History  Administered Date(s) Administered  . Influenza,inj,Quad PF,6+ Mos 06/12/2019  .  PFIZER SARS-COV-2 Vaccination 12/05/2019, 12/26/2019  . Tdap 05/22/2011, 04/09/2018   Health Maintenance Due  Topic Date Due  . PNA vac Low Risk Adult (1 of 2 - PCV13) Never done  . INFLUENZA VACCINE  05/01/2020    Activities of Daily Living In your present state of health, do you have any difficulty performing the following activities: 05/10/2020 05/10/2020  Hearing? N N  Vision? N N  Difficulty concentrating or making decisions? N N  Walking or climbing stairs? N N  Dressing or bathing? N N  Doing errands, shopping? N N  Preparing Food and eating ? N -  Using the Toilet? N -  In the past six months, have you accidently leaked urine? N -  Do you have problems with loss of bowel control? N -  Managing your Medications? N -  Managing your Finances? N -  Housekeeping or managing your Housekeeping? N -  Some recent data might be hidden    Physical Exam  (optional), or  other factors deemed appropriate based on the beneficiary's medical and social history and current clinical standards.  Advanced Directives: Does Patient Have a Medical Advance Directive?: No Would patient like information on creating a medical advance directive?: Yes (MAU/Ambulatory/Procedural Areas - Information given)    Assessment:    This is a routine wellness examination for this patient .   Vision/Hearing screen  Hearing Screening   125Hz  250Hz  500Hz  1000Hz  2000Hz  3000Hz  4000Hz  6000Hz  8000Hz   Right ear:   Pass Pass Pass Pass Pass    Left ear:   Pass Pass Pass Pass Pass      Visual Acuity Screening   Right eye Left eye Both eyes  Without correction: 20/30 20/30 20/20   With correction:       Dietary issues and exercise activities discussed:  Current Exercise Habits: Structured exercise class, Type of exercise: walking (eliptical), Time (Minutes): 30, Frequency (Times/Week): 4, Weekly Exercise (Minutes/Week): 120, Intensity: Moderate, Exercise limited by: None identified  Goals    . Exercise 3x per  week (30 min per time)     I would like to continue to exercise 4 days a week and be mindful of what I am consuming.      Depression Screen PHQ 2/9 Scores 05/10/2020 10/22/2019 04/21/2019 11/12/2018  PHQ - 2 Score 0 0 1 0     Fall Risk Fall Risk  05/10/2020  Falls in the past year? 0  Number falls in past yr: 0    Cognitive Function:     6CIT Screen 05/10/2020  What Year? 0 points  What month? 0 points  What time? 0 points  Count back from 20 0 points  Months in reverse 0 points  Repeat phrase 0 points  Total Score 0    Patient Care Team: Minette Brine, FNP as PCP - General (General Practice) Nicholas Lose, MD as Consulting Physician (Hematology and Oncology) Rolm Bookbinder, MD as Consulting Physician (General Surgery) Eppie Gibson, MD as Attending Physician (Radiation Oncology)     Plan:  See above.   I have personally reviewed and noted the following in the patient's chart:   . Medical and social history . Use of alcohol, tobacco or illicit drugs  . Current medications and supplements . Functional ability and status . Nutritional status . Physical activity . Advanced directives . List of other physicians . Hospitalizations, surgeries, and ER visits in previous 12 months . Vitals . Screenings to include cognitive, depression, and falls . Referrals and appointments  In addition, I have reviewed and discussed with patient certain preventive protocols, quality metrics, and best practice recommendations. A written personalized care plan for preventive services as well as general preventive health recommendations were provided to patient.     Minette Brine, FNP 06/26/2020

## 2020-05-10 NOTE — Patient Instructions (Addendum)
Emily Ross , Thank you for taking time to come for your Medicare Wellness Visit. I appreciate your ongoing commitment to your health goals. Please review the following plan we discussed and let me know if I can assist you in the future.   These are the goals we discussed: Goals    . Exercise 3x per week (30 min per time)     I would like to continue to exercise 4 days a week and be mindful of what I am consuming.       This is a list of the screening recommended for you and due dates:  Health Maintenance  Topic Date Due  . HIV Screening  Never done  . Pneumonia vaccines (1 of 2 - PCV13) 04/19/2020  . Flu Shot  05/01/2020  . Mammogram  04/19/2021  . Pap Smear  08/02/2022  . Tetanus Vaccine  04/09/2028  . Colon Cancer Screening  12/02/2028  . DEXA scan (bone density measurement)  Completed  . COVID-19 Vaccine  Completed  .  Hepatitis C: One time screening is recommended by Center for Disease Control  (CDC) for  adults born from [birth year] through 1966.   Completed    Advance Directive  Advance directives are legal documents that let you make choices ahead of time about your health care and medical treatment in case you become unable to communicate for yourself. Advance directives are a way for you to make known your wishes to family, friends, and health care providers. This can let others know about your end-of-life care if you become unable to communicate. Discussing and writing advance directives should happen over time rather than all at once. Advance directives can be changed depending on your situation and what you want, even after you have signed the advance directives. There are different types of advance directives, such as:  Medical power of attorney.  Living will.  Do not resuscitate (DNR) or do not attempt resuscitation (DNAR) order. Health care proxy and medical power of attorney A health care proxy is also called a health care agent. This is a person who is appointed to  make medical decisions for you in cases where you are unable to make the decisions yourself. Generally, people choose someone they know well and trust to represent their preferences. Make sure to ask this person for an agreement to act as your proxy. A proxy may have to exercise judgment in the event of a medical decision for which your wishes are not known. A medical power of attorney is a legal document that names your health care proxy. Depending on the laws in your state, after the document is written, it may also need to be:  Signed.  Notarized.  Dated.  Copied.  Witnessed.  Incorporated into your medical record. You may also want to appoint someone to manage your money in a situation in which you are unable to do so. This is called a durable power of attorney for finances. It is a separate legal document from the durable power of attorney for health care. You may choose the same person or someone different from your health care proxy to act as your agent in money matters. If you do not appoint a proxy, or if there is a concern that the proxy is not acting in your best interests, a court may appoint a guardian to act on your behalf. Living will A living will is a set of instructions that state your wishes about medical care when you cannot  express them yourself. Health care providers should keep a copy of your living will in your medical record. You may want to give a copy to family members or friends. To alert caregivers in case of an emergency, you can place a card in your wallet to let them know that you have a living will and where they can find it. A living will is used if you become:  Terminally ill.  Disabled.  Unable to communicate or make decisions. Items to consider in your living will include:  To use or not to use life-support equipment, such as dialysis machines and breathing machines (ventilators).  A DNR or DNAR order. This tells health care providers not to use  cardiopulmonary resuscitation (CPR) if breathing or heartbeat stops.  To use or not to use tube feeding.  To be given or not to be given food and fluids.  Comfort (palliative) care when the goal becomes comfort rather than a cure.  Donation of organs and tissues. A living will does not give instructions for distributing your money and property if you should pass away. DNR or DNAR A DNR or DNAR order is a request not to have CPR in the event that your heart stops beating or you stop breathing. If a DNR or DNAR order has not been made and shared, a health care provider will try to help any patient whose heart has stopped or who has stopped breathing. If you plan to have surgery, talk with your health care provider about how your DNR or DNAR order will be followed if problems occur. What if I do not have an advance directive? If you do not have an advance directive, some states assign family decision makers to act on your behalf based on how closely you are related to them. Each state has its own laws about advance directives. You may want to check with your health care provider, attorney, or state representative about the laws in your state. Summary  Advance directives are the legal documents that allow you to make choices ahead of time about your health care and medical treatment in case you become unable to tell others about your care.  The process of discussing and writing advance directives should happen over time. You can change the advance directives, even after you have signed them.  Advance directives include DNR or DNAR orders, living wills, and designating an agent as your medical power of attorney. This information is not intended to replace advice given to you by your health care provider. Make sure you discuss any questions you have with your health care provider. Document Revised: 04/16/2019 Document Reviewed: 04/16/2019 Elsevier Patient Education  [place].  [email or web address]

## 2020-05-11 LAB — HEMOGLOBIN A1C
Est. average glucose Bld gHb Est-mCnc: 120 mg/dL
Hgb A1c MFr Bld: 5.8 % — ABNORMAL HIGH (ref 4.8–5.6)

## 2020-05-11 LAB — CBC
Hematocrit: 36.4 % (ref 34.0–46.6)
Hemoglobin: 12 g/dL (ref 11.1–15.9)
MCH: 30.5 pg (ref 26.6–33.0)
MCHC: 33 g/dL (ref 31.5–35.7)
MCV: 92 fL (ref 79–97)
Platelets: 270 10*3/uL (ref 150–450)
RBC: 3.94 x10E6/uL (ref 3.77–5.28)
RDW: 12.1 % (ref 11.7–15.4)
WBC: 4.8 10*3/uL (ref 3.4–10.8)

## 2020-05-11 LAB — CMP14+EGFR
ALT: 15 IU/L (ref 0–32)
AST: 20 IU/L (ref 0–40)
Albumin/Globulin Ratio: 1.4 (ref 1.2–2.2)
Albumin: 4.4 g/dL (ref 3.8–4.8)
Alkaline Phosphatase: 79 IU/L (ref 48–121)
BUN/Creatinine Ratio: 17 (ref 12–28)
BUN: 16 mg/dL (ref 8–27)
Bilirubin Total: 0.4 mg/dL (ref 0.0–1.2)
CO2: 28 mmol/L (ref 20–29)
Calcium: 10.7 mg/dL — ABNORMAL HIGH (ref 8.7–10.3)
Chloride: 100 mmol/L (ref 96–106)
Creatinine, Ser: 0.92 mg/dL (ref 0.57–1.00)
GFR calc Af Amer: 76 mL/min/{1.73_m2} (ref >59)
GFR calc non Af Amer: 66 mL/min/{1.73_m2} (ref >59)
Globulin, Total: 3.1 g/dL (ref 1.5–4.5)
Glucose: 92 mg/dL (ref 65–99)
Potassium: 5.1 mmol/L (ref 3.5–5.2)
Sodium: 141 mmol/L (ref 134–144)
Total Protein: 7.5 g/dL (ref 6.0–8.5)

## 2020-05-11 LAB — LIPID PANEL
Chol/HDL Ratio: 2.8 ratio (ref 0.0–4.4)
Cholesterol, Total: 180 mg/dL (ref 100–199)
HDL: 64 mg/dL (ref >39)
LDL Chol Calc (NIH): 104 mg/dL — ABNORMAL HIGH (ref 0–99)
Triglycerides: 66 mg/dL (ref 0–149)
VLDL Cholesterol Cal: 12 mg/dL (ref 5–40)

## 2020-05-11 LAB — HIV ANTIBODY (ROUTINE TESTING W REFLEX): HIV Screen 4th Generation wRfx: NONREACTIVE

## 2020-05-11 LAB — VITAMIN D 25 HYDROXY (VIT D DEFICIENCY, FRACTURES): Vit D, 25-Hydroxy: 70.6 ng/mL (ref 30.0–100.0)

## 2020-05-12 NOTE — Progress Notes (Signed)
Not seen

## 2020-06-21 ENCOUNTER — Encounter: Payer: Self-pay | Admitting: Licensed Clinical Social Worker

## 2020-06-21 NOTE — Progress Notes (Signed)
[facility] Quality of Life Screening Clinical Social Work  Clinical Social Work received follow-up Quality of Life Screener (QOL-CS) back from patient.   Patient scored a 35.01 which indicates moderate quality of life. This is 4.19 lower than initial score indicating an decreased quality of life.  Patient can continue to access support services, including support groups and resource referrals, as needed. Patient is confirmed to attend [facility] (Finding Your New Normal) survivorship class this fall.     Cieanna Stormes E, LCSW

## 2020-07-23 ENCOUNTER — Other Ambulatory Visit: Payer: Self-pay | Admitting: Nurse Practitioner

## 2020-07-23 DIAGNOSIS — E7800 Pure hypercholesterolemia, unspecified: Secondary | ICD-10-CM

## 2020-07-27 ENCOUNTER — Other Ambulatory Visit: Payer: Self-pay | Admitting: Nurse Practitioner

## 2020-07-27 DIAGNOSIS — I10 Essential (primary) hypertension: Secondary | ICD-10-CM

## 2020-07-27 DIAGNOSIS — E7800 Pure hypercholesterolemia, unspecified: Secondary | ICD-10-CM

## 2020-09-20 ENCOUNTER — Telehealth: Payer: Self-pay | Admitting: Hematology and Oncology

## 2020-09-20 NOTE — Telephone Encounter (Signed)
Rescheduled appt due to provider PAL. Patient is aware of changes. 

## 2020-10-24 NOTE — Progress Notes (Signed)
Patient Care Team: Minette Brine, FNP as PCP - General (General Practice) Nicholas Lose, MD as Consulting Physician (Hematology and Oncology) Rolm Bookbinder, MD as Consulting Physician (General Surgery) Eppie Gibson, MD as Attending Physician (Radiation Oncology)  DIAGNOSIS:    ICD-10-CM   1. Malignant neoplasm of upper-outer quadrant of right breast in female, estrogen receptor positive ([place])  C50.411    Z17.0     SUMMARY OF ONCOLOGIC HISTORY: Oncology History  Malignant neoplasm of upper-outer quadrant of right breast in female, estrogen receptor positive ([place])  04/02/2019 Initial Diagnosis   Screening mammogram detected a mass in the right axilla. [place] confirmed a 0.9cm right breast mass. Biopsy showed IDC, grade 2, HER-2+ (3+), ER+ 100%, PR+ 5%, Ki67 15%.    04/08/2019 Cancer Staging   Staging form: Breast, AJCC 8th Edition - Clinical stage from 04/08/2019: Stage IA (cT1b, cN0, cM0, G2, ER+, PR+, HER2+) - Signed by Nicholas Lose, MD on 04/08/2019   04/14/2019 Genetic Testing   Patient had genetic testing done for her personal and family history of breast cancer.  Negative genetic testing on the [facility] Breast Cancer STAT panel. The STAT Breast cancer panel offered by [facility] includes sequencing and rearrangement analysis for the following 9 genes:  ATM, BRCA1, BRCA2, CDH1, CHEK2, PALB2, PTEN, STK11 and TP53.  The report date is 04/14/2019.   04/17/2019 Surgery   Right lumpectomy [name]): IDC, 2.0cm, grade 2, clear margins, 5 axillary lymph nodes negative for carcinoma   08/24/2019 - 09/21/2019 Radiation Therapy   Adjuvant radiation   10/01/2019 -  Anti-estrogen oral therapy   Anastrozole daily   Carcinoma of upper-inner quadrant of right breast in female, estrogen receptor positive ([place])  04/08/2019 Cancer Staging   Staging form: Breast, AJCC 8th Edition - Clinical stage from 04/08/2019: Stage IA (cT1b, cN0, cM0, G2, ER+, PR+, HER2+) - Signed by Eppie Gibson, MD on 04/08/2019    05/15/2019 -  Chemotherapy   The patient had PACLitaxel (TAXOL) 144 mg in sodium chloride 0.9 % 250 mL chemo infusion (</= 63m/m2), 80 mg/m2 = 144 mg, Intravenous,  Once, 3 of 3 cycles Administration: 144 mg (05/15/2019), 144 mg (05/22/2019), 144 mg (06/12/2019), 144 mg (05/29/2019), 144 mg (06/05/2019), 144 mg (06/19/2019), 144 mg (06/26/2019), 144 mg (07/03/2019), 144 mg (07/10/2019), 144 mg (07/17/2019), 144 mg (07/24/2019), 144 mg (07/31/2019) trastuzumab-anns (KANJINTI) 300 mg in sodium chloride 0.9 % 250 mL chemo infusion, 294 mg (100 % of original dose 4 mg/kg), Intravenous,  Once, 16 of 16 cycles Dose modification: 4 mg/kg (original dose 4 mg/kg, Cycle 1, Reason: Other (see comments), Comment: load), 6 mg/kg (original dose 2 mg/kg, Cycle 3, Reason: Other (see comments), Comment: q3wk dosing), 2 mg/kg (original dose 2 mg/kg, Cycle 3, Reason: Other (see comments)), 450 mg (original dose 2 mg/kg, Cycle 3, Reason: Other (see comments), Comment: Maintenance dosing), 450 mg (original dose 6 mg/kg, Cycle 4, Reason: Other (see comments), Comment: rounded; vial size), 6 mg/kg (original dose 2 mg/kg, Cycle 12, Reason: Other (see comments), Comment: pt on q3weeks), 6 mg/kg (original dose 2 mg/kg, Cycle 15, Reason: Other (see comments), Comment: dosing is q3weeks) Administration: 300 mg (05/15/2019), 150 mg (05/22/2019), 150 mg (05/29/2019), 150 mg (06/05/2019), 150 mg (06/12/2019), 150 mg (06/19/2019), 150 mg (06/26/2019), 150 mg (07/03/2019), 150 mg (07/10/2019), 150 mg (07/17/2019), 150 mg (07/24/2019), 450 mg (07/31/2019), 450 mg (08/21/2019), 450 mg (09/11/2019), 450 mg (10/01/2019), 450 mg (11/13/2019), 450 mg (12/04/2019), 450 mg (02/05/2020), 450 mg (02/26/2020), 450 mg (04/29/2020), 450 mg (12/25/2019), 450  mg (10/23/2019), 450 mg (01/15/2020), 450 mg (03/18/2020), 450 mg (04/08/2020)  for chemotherapy treatment.      CHIEF COMPLIANT: Follow-upof right breast cancer on anastrozole  INTERVAL HISTORY: Emily Ross is a 65 y.o.  with above-mentioned history of breast cancerwho underwent alumpectomy,adjuvant chemotherapy, radiation, Herceptin maintenance, and is currently on antiestrogen therapy with anastrozole.She is a participant in the [employer] clinical trial.She presents to the clinic todayfor follow-up.   ALLERGIES:  has No Known Allergies.  MEDICATIONS:  Current Outpatient Medications  Medication Sig Dispense Refill  . anastrozole (ARIMIDEX) 1 MG tablet Take 1 tablet (1 mg total) by mouth daily. 90 tablet 3  . carboxymethylcellul-glycerin (LUBRICATING EYE DROPS) 0.5-0.9 % ophthalmic solution Place 1 drop into both eyes as needed (dry eyes). 30 mL 1  . Cholecalciferol (DIALYVITE VITAMIN D 5000) 125 MCG (5000 UT) capsule Take 5,000 Units by mouth daily.    . diphenhydrAMINE (BENADRYL) 25 MG tablet Take 25 mg by mouth daily as needed for allergies.    . hydrochlorothiazide (HYDRODIURIL) 25 MG tablet TAKE ONE TABLET BY MOUTH ONE TIME DAILY 90 tablet 0  . ibuprofen (ADVIL) 600 MG tablet Take 1 tablet (600 mg total) by mouth every 6 (six) hours as needed. 30 tablet 2  . lidocaine-prilocaine (EMLA) cream Apply to affected area once (Patient not taking: Reported on 05/10/2020) 30 g 3  . metoprolol tartrate (LOPRESSOR) 50 MG tablet TAKE ONE TABLET BY MOUTH TWICE DAILY 180 tablet 0  . Red Yeast Rice Extract (RED YEAST RICE PO) Take 2 capsules by mouth daily.    . simvastatin (ZOCOR) 10 MG tablet TAKE ONE TABLET BY MOUTH DAILY IN THE EVENING 90 tablet 0   No current facility-administered medications for this visit.    PHYSICAL EXAMINATION: ECOG PERFORMANCE STATUS: 1 - Symptomatic but completely ambulatory  There were no vitals filed for this visit. There were no vitals filed for this visit.  BREAST: No palpable masses or nodules in either right or left breasts. No palpable axillary supraclavicular or infraclavicular adenopathy no breast tenderness or nipple discharge. (exam performed in the presence of a  chaperone)  LABORATORY DATA:  I have reviewed the data as listed CMP Latest Ref Rng & Units 05/10/2020 04/29/2020 03/18/2020  Glucose 65 - 99 mg/dL 92 98 91  BUN 8 - 27 mg/dL 16 18 23   Creatinine 0.57 - 1.00 mg/dL 0.92 0.81 0.82  Sodium 134 - 144 mmol/L 141 138 138  Potassium 3.5 - 5.2 mmol/L 5.1 3.4(L) 3.4(L)  Chloride 96 - 106 mmol/L 100 102 103  CO2 20 - 29 mmol/L 28 28 28   Calcium 8.7 - 10.3 mg/dL 10.7(H) 10.6(H) 9.8  Total Protein 6.0 - 8.5 g/dL 7.5 7.4 7.2  Total Bilirubin 0.0 - 1.2 mg/dL 0.4 0.5 0.3  Alkaline Phos 48 - 121 IU/L 79 61 64  AST 0 - 40 IU/L 20 18 19   ALT 0 - 32 IU/L 15 15 14     Lab Results  Component Value Date   WBC 4.8 05/10/2020   HGB 12.0 05/10/2020   HCT 36.4 05/10/2020   MCV 92 05/10/2020   PLT 270 05/10/2020   NEUTROABS 1.6 (L) 04/29/2020    ASSESSMENT & PLAN:  Malignant neoplasm of upper-outer quadrant of right breast in female, estrogen receptor positive ([place]) 04/02/2019:Screening mammogram detected a mass in the right axilla. [place] confirmed a 0.9cm right breast mass. Biopsy showed IDC, grade 2, HER-2+ (3+), ER+ 100%, PR+ 5%, Ki67 15%.  Treatment plan: 1.Breast conserving surgery  with sentinel lymph node biopsy 2.Adjuvant chemotherapy with Taxol Herceptin weekly x12 followed by Herceptin maintenance for 1 year started8/14/2020 completed July 2021 3.Adjuvant radiation therapywill be starting 08/22/2019-09/21/2019 4.Followed by adjuvant antiestrogen therapy with anastrozole 1 mg daily x5 to 7 yearsstarted2/06/2020 ------------------------------------------------------------------------------------------------------------------------------------------- Current Treatment:Anastrozole started 11/10/2019  Breast cancer surveillance: 1. Mammogram July 2021: Benign 2. Breast exam 10/25/2020: Benign  Anastrozole toxicities: Denies any hot flashes or myalgias. Return to clinic in 1 year for follow-up  No orders of the defined types were placed  in this encounter.  The patient has a good understanding of the overall plan. she agrees with it. she will call with any problems that may develop before the next visit here.  Total time spent: 20 mins including face to face time and time spent for planning, charting and coordination of care  Nicholas Lose, MD 10/25/2020  I, Cloyde Reams Dorshimer, am acting as scribe for Dr. Nicholas Lose.  I have reviewed the above documentation for accuracy and completeness, and I agree with the above.

## 2020-10-25 ENCOUNTER — Inpatient Hospital Stay: Payer: Medicare Other | Attending: Hematology and Oncology | Admitting: Hematology and Oncology

## 2020-10-25 ENCOUNTER — Other Ambulatory Visit: Payer: Self-pay

## 2020-10-25 DIAGNOSIS — Z9221 Personal history of antineoplastic chemotherapy: Secondary | ICD-10-CM | POA: Diagnosis not present

## 2020-10-25 DIAGNOSIS — Z170 Estrogen receptor positive status [ER+]: Secondary | ICD-10-CM | POA: Diagnosis not present

## 2020-10-25 DIAGNOSIS — Z79899 Other long term (current) drug therapy: Secondary | ICD-10-CM | POA: Insufficient documentation

## 2020-10-25 DIAGNOSIS — C50411 Malignant neoplasm of upper-outer quadrant of right female breast: Secondary | ICD-10-CM | POA: Insufficient documentation

## 2020-10-25 DIAGNOSIS — Z79811 Long term (current) use of aromatase inhibitors: Secondary | ICD-10-CM | POA: Insufficient documentation

## 2020-10-25 DIAGNOSIS — Z923 Personal history of irradiation: Secondary | ICD-10-CM | POA: Diagnosis not present

## 2020-10-25 MED ORDER — ANASTROZOLE 1 MG PO TABS: 1.0000 mg | ORAL_TABLET | Freq: Every day | ORAL | 3 refills | Status: DC

## 2020-10-25 NOTE — Assessment & Plan Note (Signed)
04/02/2019:Screening mammogram detected a mass in the right axilla. [place] confirmed a 0.9cm right breast mass. Biopsy showed IDC, grade 2, HER-2+ (3+), ER+ 100%, PR+ 5%, Ki67 15%.  Treatment plan: 1.Breast conserving surgery with sentinel lymph node biopsy 2.Adjuvant chemotherapy with Taxol Herceptin weekly x12 followed by Herceptin maintenance for 1 year started8/14/2020 completed July 2021 3.Adjuvant radiation therapywill be starting 08/22/2019-09/21/2019 4.Followed by adjuvant antiestrogen therapy with anastrozole 1 mg daily x5 to 7 yearsstarted2/06/2020 ------------------------------------------------------------------------------------------------------------------------------------------- Current Treatment:Anastrozole started 11/10/2019  Breast cancer surveillance: 1. Mammogram July 2021: Benign 2. Breast exam 10/25/2020: Benign  Anastrozole toxicities: Denies any hot flashes or myalgias. Return to clinic in 1 year for follow-up

## 2020-10-26 ENCOUNTER — Telehealth: Payer: Self-pay | Admitting: Hematology and Oncology

## 2020-10-26 NOTE — Telephone Encounter (Signed)
Scheduled appt per 1/25 los. Pt confirmed appt date and time.

## 2020-10-28 ENCOUNTER — Ambulatory Visit: Payer: Medicare Other | Admitting: Hematology and Oncology

## 2020-10-28 ENCOUNTER — Other Ambulatory Visit: Payer: Self-pay | Admitting: Nurse Practitioner

## 2020-10-28 DIAGNOSIS — I10 Essential (primary) hypertension: Secondary | ICD-10-CM

## 2020-11-15 ENCOUNTER — Encounter: Payer: Self-pay | Admitting: Nurse Practitioner

## 2020-11-15 ENCOUNTER — Other Ambulatory Visit: Payer: Self-pay

## 2020-11-15 ENCOUNTER — Ambulatory Visit (INDEPENDENT_AMBULATORY_CARE_PROVIDER_SITE_OTHER): Payer: Medicare Other | Admitting: Nurse Practitioner

## 2020-11-15 VITALS — BP 122/76 | HR 72 | Temp 98.6°F | Ht 64.2 in | Wt 160.0 lb

## 2020-11-15 DIAGNOSIS — E7800 Pure hypercholesterolemia, unspecified: Secondary | ICD-10-CM | POA: Diagnosis not present

## 2020-11-15 DIAGNOSIS — I10 Essential (primary) hypertension: Secondary | ICD-10-CM

## 2020-11-15 DIAGNOSIS — Z23 Encounter for immunization: Secondary | ICD-10-CM

## 2020-11-15 DIAGNOSIS — R7303 Prediabetes: Secondary | ICD-10-CM | POA: Diagnosis not present

## 2020-11-15 MED ORDER — SIMVASTATIN 10 MG PO TABS: 10.0000 mg | ORAL_TABLET | Freq: Every evening | ORAL | 1 refills | Status: DC

## 2020-11-15 MED ORDER — HYDROCHLOROTHIAZIDE 25 MG PO TABS
25.0000 mg | ORAL_TABLET | Freq: Every day | ORAL | 1 refills | Status: DC
Start: 2020-11-15 — End: 2021-05-11

## 2020-11-15 NOTE — Patient Instructions (Addendum)
Hypertension, Adult Hypertension is another name for high blood pressure. High blood pressure forces your heart to work harder to pump blood. This can cause problems over time. There are two numbers in a blood pressure reading. There is a top number (systolic) over a bottom number (diastolic). It is best to have a blood pressure that is below 120/80. Healthy choices can help lower your blood pressure, or you may need medicine to help lower it. What are the causes? The cause of this condition is not known. Some conditions may be related to high blood pressure. What increases the risk?  Smoking.  Having type 2 diabetes mellitus, high cholesterol, or both.  Not getting enough exercise or physical activity.  Being overweight.  Having too much fat, sugar, calories, or salt (sodium) in your diet.  Drinking too much alcohol.  Having long-term (chronic) kidney disease.  Having a family history of high blood pressure.  Age. Risk increases with age.  Race. You may be at higher risk if you are African American.  Gender. Men are at higher risk than women before age 45. After age 65, women are at higher risk than men.  Having obstructive sleep apnea.  Stress. What are the signs or symptoms?  High blood pressure may not cause symptoms. Very high blood pressure (hypertensive crisis) may cause: ? Headache. ? Feelings of worry or nervousness (anxiety). ? Shortness of breath. ? Nosebleed. ? A feeling of being sick to your stomach (nausea). ? Throwing up (vomiting). ? Changes in how you see. ? Very bad chest pain. ? Seizures. How is this treated?  This condition is treated by making healthy lifestyle changes, such as: ? Eating healthy foods. ? Exercising more. ? Drinking less alcohol.  Your health care provider may prescribe medicine if lifestyle changes are not enough to get your blood pressure under control, and if: ? Your top number is above 130. ? Your bottom number is above  80.  Your personal target blood pressure may vary. Follow these instructions at home: Eating and drinking  If told, follow the DASH eating plan. To follow this plan: ? Fill one half of your plate at each meal with fruits and vegetables. ? Fill one fourth of your plate at each meal with whole grains. Whole grains include whole-wheat pasta, brown rice, and whole-grain bread. ? Eat or drink low-fat dairy products, such as skim milk or low-fat yogurt. ? Fill one fourth of your plate at each meal with low-fat (lean) proteins. Low-fat proteins include fish, chicken without skin, eggs, beans, and tofu. ? Avoid fatty meat, cured and processed meat, or chicken with skin. ? Avoid pre-made or processed food.  Eat less than 1,500 mg of salt each day.  Do not drink alcohol if: ? Your doctor tells you not to drink. ? You are pregnant, may be pregnant, or are planning to become pregnant.  If you drink alcohol: ? Limit how much you use to:  0-1 drink a day for women.  0-2 drinks a day for men. ? Be aware of how much alcohol is in your drink. In the [place] one drink equals one 12 oz bottle of beer (355 mL), one 5 oz glass of wine (148 mL), or one 1 oz glass of hard liquor (44 mL).   Lifestyle  Work with your doctor to stay at a healthy weight or to lose weight. Ask your doctor what the best weight is for you.  Get at least 30 minutes of exercise most   days of the week. This may include walking, swimming, or biking.  Get at least 30 minutes of exercise that strengthens your muscles (resistance exercise) at least 3 days a week. This may include lifting weights or doing Pilates.  Do not use any products that contain nicotine or tobacco, such as cigarettes, e-cigarettes, and chewing tobacco. If you need help quitting, ask your doctor.  Check your blood pressure at home as told by your doctor.  Keep all follow-up visits as told by your doctor. This is important.   Medicines  Take over-the-counter  and prescription medicines only as told by your doctor. Follow directions carefully.  Do not skip doses of blood pressure medicine. The medicine does not work as well if you skip doses. Skipping doses also puts you at risk for problems.  Ask your doctor about side effects or reactions to medicines that you should watch for. Contact a doctor if you:  Think you are having a reaction to the medicine you are taking.  Have headaches that keep coming back (recurring).  Feel dizzy.  Have swelling in your ankles.  Have trouble with your vision. Get help right away if you:  Get a very bad headache.  Start to feel mixed up (confused).  Feel weak or numb.  Feel faint.  Have very bad pain in your: ? Chest. ? Belly (abdomen).  Throw up more than once.  Have trouble breathing. Summary  Hypertension is another name for high blood pressure.  High blood pressure forces your heart to work harder to pump blood.  For most people, a normal blood pressure is less than 120/80.  Making healthy choices can help lower blood pressure. If your blood pressure does not get lower with healthy choices, you may need to take medicine. This information is not intended to replace advice given to you by your health care provider. Make sure you discuss any questions you have with your health care provider. Document Revised: 05/28/2018 Document Reviewed: 05/28/2018 Elsevier Patient Education  [place].   Pneumococcal Conjugate Vaccine (PCV13): What You Need to Know 1. Why get vaccinated? Pneumococcal conjugate vaccine (PCV13) can prevent pneumococcal disease. Pneumococcal disease refers to any illness caused by pneumococcal bacteria. These bacteria can cause many types of illnesses, including pneumonia, which is an infection of the lungs. Pneumococcal bacteria are one of the most common causes of pneumonia. Besides pneumonia, pneumococcal bacteria can also cause:  Ear infections  Sinus  infections  Meningitis (infection of the tissue covering the brain and spinal cord)  Bacteremia (infection of the blood) Anyone can get pneumococcal disease, but children under 65 years old, people with certain medical conditions, adults 27 years or older, and cigarette smokers are at the highest risk. Most pneumococcal infections are mild. However, some can result in long-term problems, such as brain damage or hearing loss. Meningitis, bacteremia, and pneumonia caused by pneumococcal disease can be fatal. 2. PCV13 PCV13 protects against 13 types of bacteria that cause pneumococcal disease. Infants and young children usually need 4 doses of pneumococcal conjugate vaccine, at ages 2, 72, 52, and 12-15 months. Older children (through age 70 months) may be vaccinated if they did not receive the recommended doses. A dose of PCV13 is also recommended for adults and children 6 years or older with certain medical conditions if they did not already receive PCV13. This vaccine may be given to healthy adults 61 years or older who did not already receive PCV13, based on discussions between the patient and  health care provider. 3. Talk with your health care provider Tell your vaccination provider if the person getting the vaccine:  Has had an allergic reaction after a previous dose of PCV13, to an earlier pneumococcal conjugate vaccine known as PCV7, or to any vaccine containing diphtheria toxoid (for example, DTaP), or has any severe, life-threatening allergies In some cases, your health care provider may decide to postpone PCV13 vaccination until a future visit. People with minor illnesses, such as a cold, may be vaccinated. People who are moderately or severely ill should usually wait until they recover before getting PCV13. Your health care provider can give you more information. 4. Risks of a vaccine reaction  Redness, swelling, pain, or tenderness where the shot is given, and fever, loss of appetite,  fussiness (irritability), feeling tired, headache, and chills can happen after PCV13 vaccination. Young children may be at increased risk for seizures caused by fever after PCV13 if it is administered at the same time as inactivated influenza vaccine. Ask your health care provider for more information. People sometimes faint after medical procedures, including vaccination. Tell your provider if you feel dizzy or have vision changes or ringing in the ears. As with any medicine, there is a very remote chance of a vaccine causing a severe allergic reaction, other serious injury, or death. 5. What if there is a serious problem? An allergic reaction could occur after the vaccinated person leaves the clinic. If you see signs of a severe allergic reaction (hives, swelling of the face and throat, difficulty breathing, a fast heartbeat, dizziness, or weakness), call 9-1-1 and get the person to the nearest hospital. For other signs that concern you, call your health care provider. Adverse reactions should be reported to the Vaccine Adverse Event Reporting System (VAERS). Your health care provider will usually file this report, or you can do it yourself. Visit the VAERS website at www.vaers.SamedayNews.es or call 510-656-6553. VAERS is only for reporting reactions, and VAERS staff members do not give medical advice. 6. The National Vaccine Injury Compensation Program The [employer] Vaccine Injury Compensation Program (VICP) is a federal program that was created to compensate people who may have been injured by certain vaccines. Claims regarding alleged injury or death due to vaccination have a time limit for filing, which may be as short as two years. Visit the VICP website at GoldCloset.com.ee or call 339-249-8607 to learn about the program and about filing a claim. 7. How can I learn more?  Ask your health care provider.  Call your local or state health department.  Visit the website of the Food and  Drug Administration (FDA) for vaccine package inserts and additional information at TraderRating.uy.  Contact the Centers for Disease Control and Prevention (CDC): ? Call (917) 409-2879 (1-800-CDC-INFO) or ? Visit CDC's website at http://hunter.com/. Vaccine Information Statement PCV13 (05/06/2020) This information is not intended to replace advice given to you by your health care provider. Make sure you discuss any questions you have with your health care provider. Document Revised: 06/23/2020 Document Reviewed: 06/23/2020 Elsevier Patient Education  2021 [employer].

## 2020-11-15 NOTE — Progress Notes (Signed)
I,Yamilka Roman [employer] as a Education administrator for [employer], FNP.,have documented all relevant documentation on the behalf of Minette Brine, FNP,as directed by  Minette Brine, FNP while in the presence of Minette Brine, [place]. This visit occurred during the SARS-CoV-2 public health emergency.  Safety protocols were in place, including screening questions prior to the visit, additional usage of staff PPE, and extensive cleaning of exam room while observing appropriate contact time as indicated for disinfecting solutions.  Subjective:     Patient ID: Emily Ross , female    DOB: 1955-12-09 , 65 y.o.   MRN: [id number]   Chief Complaint  Patient presents with  . Hypertension  . Prediabetes    HPI  Patient presents today for a blood pressure and prediabetes f/u. She is working on her weight, she has changed her diet and is mostly plant based.  She has cut back on her carbohydrates.    Wt Readings from Last 3 Encounters: 11/15/20 : 160 lb (72.6 kg) 10/25/20 : 162 lb 9.6 oz (73.8 kg) 05/10/20 : 167 lb 6.4 oz (75.9 kg)   Hypertension This is a chronic problem. The current episode started more than 1 year ago. The problem is unchanged. The problem is controlled. Pertinent negatives include no anxiety, headaches or shortness of breath. There are no associated agents to hypertension. Risk factors for coronary artery disease include sedentary lifestyle and post-menopausal state (she is on chemo medication). Past treatments include angiotensin blockers. Compliance problems include exercise.  There is no history of angina. There is no history of chronic renal disease.     Past Medical History:  Diagnosis Date  . Breast cancer ([place])   . Cancer [facility])    2009 left breast 2020 right breast  . Family history of breast cancer   . Family history of stomach cancer   . High cholesterol   . History of radiation therapy 08/23/19- 09/21/19   Right breast 15 fx of 2.67 Gy to total 40.05 Gy. Right Breast  boost 5 fx of 2 Gy each to total 10 Gy  . History of seasonal allergies   . Hypertension   . Pneumonia   . Wears glasses      Family History  Problem Relation Age of Onset  . Diabetes Father   . Hypertension Father   . Stomach cancer Father 92  . Diabetes Mother   . Hypertension Mother   . Breast cancer Sister 28       negative genetic testing  . Breast cancer Maternal Aunt 80  . Colon cancer Neg Hx   . Esophageal cancer Neg Hx   . Rectal cancer Neg Hx      Current Outpatient Medications:  .  anastrozole (ARIMIDEX) 1 MG tablet, Take 1 tablet (1 mg total) by mouth daily., Disp: 90 tablet, Rfl: 3 .  carboxymethylcellul-glycerin (LUBRICATING EYE DROPS) 0.5-0.9 % ophthalmic solution, Place 1 drop into both eyes as needed (dry eyes)., Disp: 30 mL, Rfl: 1 .  Cholecalciferol (DIALYVITE VITAMIN D 5000) 125 MCG (5000 UT) capsule, Take 5,000 Units by mouth daily., Disp: , Rfl:  .  diphenhydrAMINE (BENADRYL) 25 MG tablet, Take 25 mg by mouth daily as needed for allergies., Disp: , Rfl:  .  ibuprofen (ADVIL) 600 MG tablet, Take 1 tablet (600 mg total) by mouth every 6 (six) hours as needed., Disp: 30 tablet, Rfl: 2 .  metoprolol tartrate (LOPRESSOR) 50 MG tablet, TAKE ONE TABLET BY MOUTH TWICE DAILY, Disp: 180 tablet, Rfl: 0 .  hydrochlorothiazide (HYDRODIURIL) 25 MG tablet, Take 1 tablet (25 mg total) by mouth daily., Disp: 90 tablet, Rfl: 1 .  simvastatin (ZOCOR) 10 MG tablet, Take 1 tablet (10 mg total) by mouth every evening., Disp: 90 tablet, Rfl: 1   No Known Allergies   Review of Systems  Constitutional: Negative.   HENT: Negative.   Eyes: Negative.   Respiratory: Negative.  Negative for shortness of breath.   Cardiovascular: Negative.   Gastrointestinal: Negative.   Endocrine: Negative.   Genitourinary: Negative.   Musculoskeletal: Positive for arthralgias (right arm where her lymph nodes were removed to axilla right side.  ).  Skin: Negative.   Neurological: Negative.   Negative for headaches.  Hematological: Negative.   Psychiatric/Behavioral: Negative.      Today's Vitals   11/15/20 1410  BP: 122/76  Pulse: 72  Temp: 98.6 F (37 C)  TempSrc: Oral  Weight: 160 lb (72.6 kg)  Height: 5' 4.2" (1.631 m)  PainSc: 0-No pain   Body mass index is 27.29 kg/m.   Objective:  Physical Exam Constitutional:      General: She is not in acute distress.    Appearance: Normal appearance.  Cardiovascular:     Rate and Rhythm: Normal rate and regular rhythm.     Pulses: Normal pulses.     Heart sounds: Normal heart sounds. No murmur heard.   Pulmonary:     Effort: Pulmonary effort is normal. No respiratory distress.     Breath sounds: Normal breath sounds. No wheezing.  Musculoskeletal:        General: Normal range of motion.     Cervical back: Normal range of motion and neck supple.  Skin:    General: Skin is warm and dry.     Capillary Refill: Capillary refill takes less than 2 seconds.  Neurological:     General: No focal deficit present.     Mental Status: She is alert and oriented to person, place, and time.     Cranial Nerves: No cranial nerve deficit.  Psychiatric:        Mood and Affect: Mood normal.        Behavior: Behavior normal.        Thought Content: Thought content normal.        Judgment: Judgment normal.         Assessment And Plan:     1. Essential hypertension  Chronic, excellent control  Continue with current medications - hydrochlorothiazide (HYDRODIURIL) 25 MG tablet; Take 1 tablet (25 mg total) by mouth daily.  Dispense: 90 tablet; Refill: 1 - CMP14+EGFR  2. Prediabetes  Chronic, stable  Encouraged to eat healthy diet.   Will check HgbA1c - Hemoglobin A1c  3. Elevated LDL cholesterol level  She is tolerating simvastatin well, no report of muscle weakness - simvastatin (ZOCOR) 10 MG tablet; Take 1 tablet (10 mg total) by mouth every evening.  Dispense: 90 tablet; Refill: 1 - CMP14+EGFR - Lipid  panel  4. Encounter for immunization  Pneumonia 23 given in office. - Pneumococcal polysaccharide vaccine 23-valent greater than or equal to 2yo subcutaneous/IM     Patient was given opportunity to ask questions. Patient verbalized understanding of the plan and was able to repeat key elements of the plan. All questions were answered to their satisfaction.  Minette Brine, FNP   I, Minette Brine, FNP, have reviewed all documentation for this visit. The documentation on 11/15/20 for the exam, diagnosis, procedures, and orders are all accurate and complete.  THE PATIENT IS ENCOURAGED TO PRACTICE SOCIAL DISTANCING DUE TO THE COVID-19 PANDEMIC.   

## 2020-11-16 LAB — CMP14+EGFR
ALT: 10 IU/L (ref 0–32)
AST: 17 IU/L (ref 0–40)
Albumin/Globulin Ratio: 1.4 (ref 1.2–2.2)
Albumin: 4.4 g/dL (ref 3.8–4.8)
Alkaline Phosphatase: 75 IU/L (ref 44–121)
BUN/Creatinine Ratio: 15 (ref 12–28)
BUN: 11 mg/dL (ref 8–27)
Bilirubin Total: 0.4 mg/dL (ref 0.0–1.2)
CO2: 24 mmol/L (ref 20–29)
Calcium: 10.5 mg/dL — ABNORMAL HIGH (ref 8.7–10.3)
Chloride: 98 mmol/L (ref 96–106)
Creatinine, Ser: 0.73 mg/dL (ref 0.57–1.00)
GFR calc Af Amer: 100 mL/min/{1.73_m2} (ref >59)
GFR calc non Af Amer: 87 mL/min/{1.73_m2} (ref >59)
Globulin, Total: 3.1 g/dL (ref 1.5–4.5)
Glucose: 89 mg/dL (ref 65–99)
Potassium: 3.8 mmol/L (ref 3.5–5.2)
Sodium: 138 mmol/L (ref 134–144)
Total Protein: 7.5 g/dL (ref 6.0–8.5)

## 2020-11-16 LAB — LIPID PANEL
Chol/HDL Ratio: 2.7 ratio (ref 0.0–4.4)
Cholesterol, Total: 183 mg/dL (ref 100–199)
HDL: 68 mg/dL (ref >39)
LDL Chol Calc (NIH): 103 mg/dL — ABNORMAL HIGH (ref 0–99)
Triglycerides: 63 mg/dL (ref 0–149)
VLDL Cholesterol Cal: 12 mg/dL (ref 5–40)

## 2020-11-16 LAB — HEMOGLOBIN A1C
Est. average glucose Bld gHb Est-mCnc: 123 mg/dL
Hgb A1c MFr Bld: 5.9 % — ABNORMAL HIGH (ref 4.8–5.6)

## 2021-01-23 ENCOUNTER — Other Ambulatory Visit: Payer: Self-pay | Admitting: Nurse Practitioner

## 2021-01-23 DIAGNOSIS — I10 Essential (primary) hypertension: Secondary | ICD-10-CM

## 2021-01-26 ENCOUNTER — Other Ambulatory Visit: Payer: Self-pay

## 2021-01-26 ENCOUNTER — Ambulatory Visit (INDEPENDENT_AMBULATORY_CARE_PROVIDER_SITE_OTHER): Payer: Medicare Other | Admitting: Nurse Practitioner

## 2021-01-26 ENCOUNTER — Encounter: Payer: Self-pay | Admitting: Nurse Practitioner

## 2021-01-26 VITALS — BP 140/88 | HR 72 | Temp 98.7°F | Ht 65.4 in | Wt 160.2 lb

## 2021-01-26 DIAGNOSIS — D229 Melanocytic nevi, unspecified: Secondary | ICD-10-CM

## 2021-01-26 DIAGNOSIS — T7840XA Allergy, unspecified, initial encounter: Secondary | ICD-10-CM | POA: Diagnosis not present

## 2021-01-26 MED ORDER — EPINEPHRINE 0.3 MG/0.3ML IJ SOAJ: 0.3000 mg | INTRAMUSCULAR | 0 refills | Status: DC | PRN

## 2021-01-26 MED ORDER — PREDNISONE 10 MG (21) PO TBPK: ORAL_TABLET | ORAL | 0 refills | Status: DC

## 2021-01-26 NOTE — Progress Notes (Signed)
I,Yamilka Roman [employer] as a Education administrator for [employer], NP.,have documented all relevant documentation on the behalf of [employer], NP,as directed by  Bary Castilla, NP while in the presence of Bary Castilla, NP. This visit occurred during the SARS-CoV-2 public health emergency.  Safety protocols were in place, including screening questions prior to the visit, additional usage of staff PPE, and extensive cleaning of exam room while observing appropriate contact time as indicated for disinfecting solutions.  Subjective:     Patient ID: Emily Ross , female    DOB: 07-02-1956 , 65 y.o.   MRN: [id number]   Chief Complaint  Patient presents with  . Allergic Reaction    HPI  Patient presents today for an allergic reaction she had on Monday. She stated she has taken benadryl in the evenings and some ibuprofen. She stated her lips feel tingly, itching and burning and she feels like they are cracking. Her lips did swell up. She stated she is itching all over. The reaction happened after she ate dill pickle flavored peanuts. It was hard for to breath and she felt like she was breathing with her mouth open. But her throat did not close up.    Past Medical History:  Diagnosis Date  . Breast cancer ([place])   . Cancer [facility])    2009 left breast 2020 right breast  . Family history of breast cancer   . Family history of stomach cancer   . High cholesterol   . History of radiation therapy 08/23/19- 09/21/19   Right breast 15 fx of 2.67 Gy to total 40.05 Gy. Right Breast boost 5 fx of 2 Gy each to total 10 Gy  . History of seasonal allergies   . Hypertension   . Pneumonia   . Wears glasses      Family History  Problem Relation Age of Onset  . Diabetes Father   . Hypertension Father   . Stomach cancer Father 74  . Diabetes Mother   . Hypertension Mother   . Breast cancer Sister 80       negative genetic testing  . Breast cancer Maternal Aunt 80  . Colon cancer Neg  Hx   . Esophageal cancer Neg Hx   . Rectal cancer Neg Hx      Current Outpatient Medications:  .  anastrozole (ARIMIDEX) 1 MG tablet, Take 1 tablet (1 mg total) by mouth daily., Disp: 90 tablet, Rfl: 3 .  carboxymethylcellul-glycerin (LUBRICATING EYE DROPS) 0.5-0.9 % ophthalmic solution, Place 1 drop into both eyes as needed (dry eyes)., Disp: 30 mL, Rfl: 1 .  Cholecalciferol (DIALYVITE VITAMIN D 5000) 125 MCG (5000 UT) capsule, Take 5,000 Units by mouth daily., Disp: , Rfl:  .  diphenhydrAMINE (BENADRYL) 25 MG tablet, Take 25 mg by mouth daily as needed for allergies., Disp: , Rfl:  .  EPINEPHrine (EPIPEN 2-PAK) 0.3 mg/0.3 mL IJ SOAJ injection, Inject 0.3 mg into the muscle as needed for anaphylaxis., Disp: 1 each, Rfl: 0 .  hydrochlorothiazide (HYDRODIURIL) 25 MG tablet, Take 1 tablet (25 mg total) by mouth daily., Disp: 90 tablet, Rfl: 1 .  ibuprofen (ADVIL) 600 MG tablet, Take 1 tablet (600 mg total) by mouth every 6 (six) hours as needed., Disp: 30 tablet, Rfl: 2 .  metoprolol tartrate (LOPRESSOR) 50 MG tablet, TAKE ONE TABLET BY MOUTH TWICE DAILY, Disp: 180 tablet, Rfl: 0 .  predniSONE (STERAPRED UNI-PAK 21 TAB) 10 MG (21) TBPK tablet, Take as directed., Disp: 21 tablet, Rfl: 0 .  simvastatin (ZOCOR) 10 MG tablet, Take 1 tablet (10 mg total) by mouth every evening., Disp: 90 tablet, Rfl: 1   No Known Allergies   Review of Systems  Constitutional: Negative for chills and fever.  HENT: Negative for congestion, facial swelling and sore throat.        Lip swelling   Eyes: Negative for pain and redness.  Respiratory: Negative for apnea, cough, chest tightness, shortness of breath and wheezing.   Cardiovascular: Negative for chest pain and palpitations.  Gastrointestinal: Negative for constipation, diarrhea and nausea.  Musculoskeletal: Negative for arthralgias and myalgias.  Allergic/Immunologic: Positive for food allergies.  Neurological: Negative for numbness.       Tingling on her  lips     Today's Vitals   01/26/21 1436  BP: 140/88  Pulse: 72  Temp: 98.7 F (37.1 C)  TempSrc: Oral  Weight: 160 lb 3.2 oz (72.7 kg)  Height: 5' 5.4" (1.661 m)  PainSc: 0-No pain   Body mass index is 26.33 kg/m.   Objective:  Physical Exam Constitutional:      Appearance: Normal appearance.  HENT:     Head: Normocephalic and atraumatic.     Mouth/Throat:     Lips: Pink. No lesions.     Mouth: Mucous membranes are moist. No angioedema.  Cardiovascular:     Rate and Rhythm: Normal rate and regular rhythm.     Pulses: Normal pulses.     Heart sounds: Normal heart sounds. No murmur heard.   Pulmonary:     Effort: Pulmonary effort is normal. No respiratory distress.     Breath sounds: Normal breath sounds. No wheezing.  Skin:    General: Skin is warm and dry.     Capillary Refill: Capillary refill takes less than 2 seconds.     Findings: Rash present.       Neurological:     Mental Status: She is alert.         Assessment And Plan:     1. Allergic reaction, initial encounter -Patient had a allergic reaction to something she ate. Will do a allergy food panel on her. -Start her on prednisone dose pak  -Instructed her to continue using benadryl along with Pepcid as needed.  -Instructed her if she experiences any shortness of breath, chest pain or swelling of her throat, she should seek emergency help.  - Allergen food profile specific IgE (86003) - predniSONE (STERAPRED UNI-PAK 21 TAB) 10 MG (21) TBPK tablet; Take as directed.  Dispense: 21 tablet; Refill: 0 - EPINEPHrine (EPIPEN 2-PAK) 0.3 mg/0.3 mL IJ SOAJ injection; Inject 0.3 mg into the muscle as needed for anaphylaxis.  Dispense: 1 each; Refill: 0  2. Atypical mole -Patient has had a mole on her face that she would like to get further evaluated.  - Ambulatory referral to Dermatology   Follow up: If her symptoms get worse or do no resolve.   Patient was given opportunity to ask questions. Patient  verbalized understanding of the plan and was able to repeat key elements of the plan. All questions were answered to their satisfaction.  Bary Castilla, DNP   I, Bary Castilla, DNP have reviewed all documentation for this visit. The documentation on 01/26/21 for the exam, diagnosis, procedures, and orders are all accurate and complete.     IF YOU HAVE BEEN REFERRED TO A SPECIALIST, IT MAY TAKE 1-2 WEEKS TO SCHEDULE/PROCESS THE REFERRAL. IF YOU HAVE NOT HEARD FROM US/SPECIALIST IN TWO WEEKS, PLEASE GIVE [place] A  CALL AT [phone] X [phone].   THE PATIENT IS ENCOURAGED TO PRACTICE SOCIAL DISTANCING DUE TO THE COVID-19 PANDEMIC.

## 2021-02-02 LAB — ALLERGEN FOOD PROFILE SPECIFIC IGE
Allergen Apple, IgE: <0.1 kU/L
Allergen Corn, IgE: <0.1 kU/L
Allergen Tomato, IgE: 0.41 kU/L — AB
Chicken IgE: <0.1 kU/L
Codfish IgE: <0.1 kU/L
Egg White IgE: <0.1 kU/L
IgE (Immunoglobulin E), Serum: 17 IU/mL (ref 6–495)
Milk IgE: <0.1 kU/L
Orange: <0.1 kU/L
Peanut IgE: <0.1 kU/L
Shrimp IgE: <0.1 kU/L
Soybean IgE: <0.1 kU/L
Tuna: <0.1 kU/L
Wheat IgE: <0.1 kU/L

## 2021-02-16 ENCOUNTER — Telehealth: Payer: Self-pay

## 2021-02-16 NOTE — Telephone Encounter (Signed)
I called pt to inform her that Dermatology specialist have been trying to contact her regarding an appt [phone]. I left her a v.m to call the office [facility]

## 2021-04-21 ENCOUNTER — Other Ambulatory Visit: Payer: Self-pay | Admitting: Nurse Practitioner

## 2021-04-21 DIAGNOSIS — I10 Essential (primary) hypertension: Secondary | ICD-10-CM

## 2021-04-25 LAB — HM MAMMOGRAPHY

## 2021-05-01 ENCOUNTER — Telehealth: Payer: Self-pay | Admitting: *Deleted

## 2021-05-01 ENCOUNTER — Encounter: Payer: Self-pay | Admitting: Nurse Practitioner

## 2021-05-01 ENCOUNTER — Other Ambulatory Visit: Payer: Self-pay | Admitting: *Deleted

## 2021-05-01 ENCOUNTER — Other Ambulatory Visit (HOSPITAL_COMMUNITY): Payer: Self-pay | Admitting: Hematology and Oncology

## 2021-05-01 DIAGNOSIS — C50919 Malignant neoplasm of unspecified site of unspecified female breast: Secondary | ICD-10-CM

## 2021-05-01 DIAGNOSIS — Z170 Estrogen receptor positive status [ER+]: Secondary | ICD-10-CM

## 2021-05-01 DIAGNOSIS — C50411 Malignant neoplasm of upper-outer quadrant of right female breast: Secondary | ICD-10-CM

## 2021-05-01 NOTE — Telephone Encounter (Signed)
WF [id number] Understanding and Predicting Breast Cancer Events after Treatment (UPBEAT)   05/01/2021 at 2:13 pm WF- [id number]/UpBeat - Consent Addendum Form Script (protocol version date 01/06/21)- The research nurse called the pt to discuss her upcoming month 24 visit. The pt confirmed that she was available to schedule  her month 24 visit on Tuesday, 05/23/21.   The research nurse explained that the study had a new Consent Addendum ([place] Active Date 02/06/21), and the research nurse needed to go over the consent with the pt over the phone.  The research nurse read the consent addendum to the pt.  The pt gave her verbal consent for this addendum and agreed to complete her questionnaires by email at 2:10 pm.  The pt confirmed her email address.  The pt also agreed to complete the new questionnaire at the month 24 visit.  The pt was specifically told that she could skip any questions that make her feel uncomfortable and that she could withdraw at any time.  The pt was thanked for her continued support of this clinical trial.  The pt's questionnaires will be sent to her email provided by the pt today 1 week prior to her scheduled visit.  The pt will be given a copy of her consent addendum for her reference at her next visit.  The pt was told that the research staff will call the pt a couple of days before her 05/23/21 appt to make sure that she has received her questionnaires by email.  The pt had no questions/concerns for the research nurse. Brion Aliment RN, BSN, [place] Nurse Lead 05/01/2021 3:53 PM

## 2021-05-11 ENCOUNTER — Other Ambulatory Visit: Payer: Self-pay | Admitting: Nurse Practitioner

## 2021-05-11 ENCOUNTER — Ambulatory Visit (INDEPENDENT_AMBULATORY_CARE_PROVIDER_SITE_OTHER): Payer: Medicare Other | Admitting: Nurse Practitioner

## 2021-05-11 ENCOUNTER — Encounter: Payer: Self-pay | Admitting: Nurse Practitioner

## 2021-05-11 ENCOUNTER — Other Ambulatory Visit: Payer: Self-pay

## 2021-05-11 ENCOUNTER — Ambulatory Visit: Payer: Medicare Other

## 2021-05-11 VITALS — BP 122/74 | HR 73 | Temp 98.9°F | Ht 64.4 in | Wt 160.4 lb

## 2021-05-11 DIAGNOSIS — C50411 Malignant neoplasm of upper-outer quadrant of right female breast: Secondary | ICD-10-CM

## 2021-05-11 DIAGNOSIS — E663 Overweight: Secondary | ICD-10-CM | POA: Diagnosis not present

## 2021-05-11 DIAGNOSIS — I10 Essential (primary) hypertension: Secondary | ICD-10-CM

## 2021-05-11 DIAGNOSIS — Z6827 Body mass index (BMI) 27.0-27.9, adult: Secondary | ICD-10-CM

## 2021-05-11 DIAGNOSIS — Z170 Estrogen receptor positive status [ER+]: Secondary | ICD-10-CM

## 2021-05-11 DIAGNOSIS — E7800 Pure hypercholesterolemia, unspecified: Secondary | ICD-10-CM

## 2021-05-11 DIAGNOSIS — H6123 Impacted cerumen, bilateral: Secondary | ICD-10-CM

## 2021-05-11 DIAGNOSIS — Z23 Encounter for immunization: Secondary | ICD-10-CM

## 2021-05-11 DIAGNOSIS — Z0000 Encounter for general adult medical examination without abnormal findings: Secondary | ICD-10-CM

## 2021-05-11 DIAGNOSIS — R7303 Prediabetes: Secondary | ICD-10-CM

## 2021-05-11 LAB — POCT URINALYSIS DIPSTICK
Bilirubin, UA: NEGATIVE
Glucose, UA: NEGATIVE
Ketones, UA: NEGATIVE
Leukocytes, UA: NEGATIVE
Nitrite, UA: NEGATIVE
Protein, UA: NEGATIVE
Spec Grav, UA: 1.01 (ref 1.010–1.025)
Urobilinogen, UA: 0.2 E.U./dL
pH, UA: 6.5 (ref 5.0–8.0)

## 2021-05-11 LAB — POCT UA - MICROALBUMIN
Albumin/Creatinine Ratio, Urine, POC: <30
Creatinine, POC: 50 mg/dL
Microalbumin Ur, POC: 10 mg/L

## 2021-05-11 MED ORDER — SHINGRIX 50 MCG/0.5ML IM SUSR: 0.5000 mL | Freq: Once | INTRAMUSCULAR | 0 refills | Status: AC

## 2021-05-11 NOTE — Patient Instructions (Signed)
Health Maintenance, Female Adopting a healthy lifestyle and getting preventive care are important in promoting health and wellness. Ask your health care provider about: The right schedule for you to have regular tests and exams. Things you can do on your own to prevent diseases and keep yourself healthy. What should I know about diet, weight, and exercise? Eat a healthy diet  Eat a diet that includes plenty of vegetables, fruits, low-fat dairy products, and lean protein. Do not eat a lot of foods that are high in solid fats, added sugars, or sodium.  Maintain a healthy weight Body mass index (BMI) is used to identify weight problems. It estimates body fat based on height and weight. Your health care provider can help determineyour BMI and help you achieve or maintain a healthy weight. Get regular exercise Get regular exercise. This is one of the most important things you can do for your health. Most adults should: Exercise for at least 150 minutes each week. The exercise should increase your heart rate and make you sweat (moderate-intensity exercise). Do strengthening exercises at least twice a week. This is in addition to the moderate-intensity exercise. Spend less time sitting. Even light physical activity can be beneficial. Watch cholesterol and blood lipids Have your blood tested for lipids and cholesterol at 66 years of age, then havethis test every 5 years. Have your cholesterol levels checked more often if: Your lipid or cholesterol levels are high. You are older than 66 years of age. You are at high risk for heart disease. What should I know about cancer screening? Depending on your health history and family history, you may need to have cancer screening at various ages. This may include screening for: Breast cancer. Cervical cancer. Colorectal cancer. Skin cancer. Lung cancer. What should I know about heart disease, diabetes, and high blood pressure? Blood pressure and heart  disease High blood pressure causes heart disease and increases the risk of stroke. This is more likely to develop in people who have high blood pressure readings, are of African descent, or are overweight. Have your blood pressure checked: Every 3-5 years if you are 18-39 years of age. Every year if you are 40 years old or older. Diabetes Have regular diabetes screenings. This checks your fasting blood sugar level. Have the screening done: Once every three years after age 40 if you are at a normal weight and have a low risk for diabetes. More often and at a younger age if you are overweight or have a high risk for diabetes. What should I know about preventing infection? Hepatitis B If you have a higher risk for hepatitis B, you should be screened for this virus. Talk with your health care provider to find out if you are at risk forhepatitis B infection. Hepatitis C Testing is recommended for: Everyone born from 1945 through 1965. Anyone with known risk factors for hepatitis C. Sexually transmitted infections (STIs) Get screened for STIs, including gonorrhea and chlamydia, if: You are sexually active and are younger than 66 years of age. You are older than 66 years of age and your health care provider tells you that you are at risk for this type of infection. Your sexual activity has changed since you were last screened, and you are at increased risk for chlamydia or gonorrhea. Ask your health care provider if you are at risk. Ask your health care provider about whether you are at high risk for HIV. Your health care provider may recommend a prescription medicine to help   prevent HIV infection. If you choose to take medicine to prevent HIV, you should first get tested for HIV. You should then be tested every 3 months for as long as you are taking the medicine. Pregnancy If you are about to stop having your period (premenopausal) and you may become pregnant, seek counseling before you get  pregnant. Take 400 to 800 micrograms (mcg) of folic acid every day if you become pregnant. Ask for birth control (contraception) if you want to prevent pregnancy. Osteoporosis and menopause Osteoporosis is a disease in which the bones lose minerals and strength with aging. This can result in bone fractures. If you are 65 years old or older, or if you are at risk for osteoporosis and fractures, ask your health care provider if you should: Be screened for bone loss. Take a calcium or vitamin D supplement to lower your risk of fractures. Be given hormone replacement therapy (HRT) to treat symptoms of menopause. Follow these instructions at home: Lifestyle Do not use any products that contain nicotine or tobacco, such as cigarettes, e-cigarettes, and chewing tobacco. If you need help quitting, ask your health care provider. Do not use street drugs. Do not share needles. Ask your health care provider for help if you need support or information about quitting drugs. Alcohol use Do not drink alcohol if: Your health care provider tells you not to drink. You are pregnant, may be pregnant, or are planning to become pregnant. If you drink alcohol: Limit how much you use to 0-1 drink a day. Limit intake if you are breastfeeding. Be aware of how much alcohol is in your drink. In the [place] one drink equals one 12 oz bottle of beer (355 mL), one 5 oz glass of wine (148 mL), or one 1 oz glass of hard liquor (44 mL). General instructions Schedule regular health, dental, and eye exams. Stay current with your vaccines. Tell your health care provider if: You often feel depressed. You have ever been abused or do not feel safe at home. Summary Adopting a healthy lifestyle and getting preventive care are important in promoting health and wellness. Follow your health care provider's instructions about healthy diet, exercising, and getting tested or screened for diseases. Follow your health care provider's  instructions on monitoring your cholesterol and blood pressure. This information is not intended to replace advice given to you by your health care provider. Make sure you discuss any questions you have with your healthcare provider. Document Revised: 09/10/2018 Document Reviewed: 09/10/2018 Elsevier Patient Education  2022 [employer]  

## 2021-05-11 NOTE — Progress Notes (Addendum)
I,Tianna Badgett,acting as a Education administrator for [employer], FNP.,have documented all relevant documentation on the behalf of Minette Brine, FNP,as directed by  Minette Brine, FNP while in the presence of Minette Brine, [place].  This visit occurred during the SARS-CoV-2 public health emergency.  Safety protocols were in place, including screening questions prior to the visit, additional usage of staff PPE, and extensive cleaning of exam room while observing appropriate contact time as indicated for disinfecting solutions.  Subjective:     Patient ID: Emily Ross , female    DOB: [birth year] , 64 y.o.   MRN: [id number]   Chief Complaint  Patient presents with   Annual Exam    HPI  Patient presents today for annual physical.  Overall has been doing well.    Wt Readings from Last 3 Encounters: 05/11/21 : 160 lb 6.4 oz (72.8 kg) 01/26/21 : 160 lb 3.2 oz (72.7 kg) 11/15/20 : 160 lb (72.6 kg)    Hypertension This is a chronic problem. The current episode started more than 1 year ago. The problem is unchanged. The problem is controlled. Pertinent negatives include no anxiety, headaches or shortness of breath. There are no associated agents to hypertension. Risk factors for coronary artery disease include sedentary lifestyle and post-menopausal state (she is on chemo medication). Past treatments include angiotensin blockers. Compliance problems include exercise.  There is no history of angina. There is no history of chronic renal disease.    Past Medical History:  Diagnosis Date   Breast cancer ([place])    Cancer ([place])    2009 left breast 2020 right breast   Family history of breast cancer    Family history of stomach cancer    High cholesterol    History of radiation therapy 08/23/19- 09/21/19   Right breast 15 fx of 2.67 Gy to total 40.05 Gy. Right Breast boost 5 fx of 2 Gy each to total 10 Gy   History of seasonal allergies    Hypertension    Pneumonia    Wears glasses      Family History   Problem Relation Age of Onset   Diabetes Father    Hypertension Father    Stomach cancer Father 59   Diabetes Mother    Hypertension Mother    Breast cancer Sister 43       negative genetic testing   Breast cancer Maternal Aunt 80   Colon cancer Neg Hx    Esophageal cancer Neg Hx    Rectal cancer Neg Hx      Current Outpatient Medications:    anastrozole (ARIMIDEX) 1 MG tablet, Take 1 tablet (1 mg total) by mouth daily., Disp: 90 tablet, Rfl: 3   carboxymethylcellul-glycerin (LUBRICATING EYE DROPS) 0.5-0.9 % ophthalmic solution, Place 1 drop into both eyes as needed (dry eyes)., Disp: 30 mL, Rfl: 1   Cholecalciferol (DIALYVITE VITAMIN D 5000) 125 MCG (5000 UT) capsule, Take 5,000 Units by mouth daily., Disp: , Rfl:    diphenhydrAMINE (BENADRYL) 25 MG tablet, Take 25 mg by mouth daily as needed for allergies., Disp: , Rfl:    EPINEPHrine (EPIPEN 2-PAK) 0.3 mg/0.3 mL IJ SOAJ injection, Inject 0.3 mg into the muscle as needed for anaphylaxis., Disp: 1 each, Rfl: 0   hydrochlorothiazide (HYDRODIURIL) 25 MG tablet, TAKE ONE TABLET BY MOUTH ONE TIME DAILY, Disp: 90 tablet, Rfl: 0   ibuprofen (ADVIL) 600 MG tablet, Take 1 tablet (600 mg total) by mouth every 6 (six) hours as needed., Disp: 30 tablet, Rfl:  2   metoprolol tartrate (LOPRESSOR) 50 MG tablet, TAKE ONE TABLET BY MOUTH TWICE DAILY, Disp: 180 tablet, Rfl: 0   simvastatin (ZOCOR) 10 MG tablet, TAKE ONE TABLET BY MOUTH DAILY IN THE EVENING, Disp: 90 tablet, Rfl: 0   Zoster Vaccine Adjuvanted (SHINGRIX) injection, Inject 0.5 mLs into the muscle once for 1 dose., Disp: 0.5 mL, Rfl: 0   Allergies  Allergen Reactions   Tomato       The patient states she is post menopausal status . Patient's last menstrual period was 12/03/2011.. Negative for Dysmenorrhea and Negative for Menorrhagia. Negative for: breast discharge, breast lump(s), breast pain and breast self exam. Associated symptoms include abnormal vaginal bleeding. Pertinent  negatives include abnormal bleeding (hematology), anxiety, decreased libido, depression, difficulty falling sleep, dyspareunia, history of infertility, nocturia, sexual dysfunction, sleep disturbances, urinary incontinence, urinary urgency, vaginal discharge and vaginal itching. Diet regular - she tries to be mindful and eat healthy, incorporating mostly vegetables and fruits. The patient states her exercise level is moderate 3-4 times a week.   The patient's tobacco use is:  Social History   Tobacco Use  Smoking Status Never  Smokeless Tobacco Never   She has been exposed to passive smoke. The patient's alcohol use is:  Social History   Substance and Sexual Activity  Alcohol Use No     Review of Systems  Constitutional: Negative.   HENT: Negative.    Eyes: Negative.   Respiratory: Negative.  Negative for shortness of breath.   Cardiovascular: Negative.   Gastrointestinal: Negative.   Endocrine: Negative.   Genitourinary: Negative.   Musculoskeletal: Negative.   Skin: Negative.   Allergic/Immunologic: Negative.   Neurological: Negative.  Negative for dizziness and headaches.  Hematological: Negative.   Psychiatric/Behavioral: Negative.      Today's Vitals   05/11/21 1438  BP: 122/74  Pulse: 73  Temp: 98.9 F (37.2 C)  TempSrc: Oral  Weight: 160 lb 6.4 oz (72.8 kg)  Height: 5' 4.4" (1.636 m)   Body mass index is 27.19 kg/m.  Wt Readings from Last 3 Encounters:  05/11/21 160 lb 6.4 oz (72.8 kg)  01/26/21 160 lb 3.2 oz (72.7 kg)  11/15/20 160 lb (72.6 kg)    Objective:  Physical Exam Constitutional:      General: She is not in acute distress.    Appearance: Normal appearance. She is well-developed. She is obese.  HENT:     Head: Normocephalic and atraumatic.     Right Ear: Hearing, tympanic membrane, ear canal and external ear normal. There is no impacted cerumen.     Left Ear: Hearing, tympanic membrane, ear canal and external ear normal. There is no impacted  cerumen.     Nose:     Comments: Deferred - masked    Mouth/Throat:     Comments: Deferred - masked Eyes:     General: Lids are normal.     Extraocular Movements: Extraocular movements intact.     Conjunctiva/sclera: Conjunctivae normal.     Pupils: Pupils are equal, round, and reactive to light.     Funduscopic exam:    Right eye: No papilledema.        Left eye: No papilledema.  Neck:     Thyroid: No thyroid mass.     Vascular: No carotid bruit.  Cardiovascular:     Rate and Rhythm: Normal rate and regular rhythm.     Pulses: Normal pulses.     Heart sounds: Normal heart sounds. No murmur heard.  Pulmonary:     Effort: Pulmonary effort is normal. No respiratory distress.     Breath sounds: Normal breath sounds. No wheezing.  Chest:  Breasts:    Tanner Score is 5.  Abdominal:     General: Abdomen is flat. Bowel sounds are normal. There is no distension.     Palpations: Abdomen is soft.     Tenderness: There is no abdominal tenderness.  Musculoskeletal:        General: No swelling. Normal range of motion.     Cervical back: Full passive range of motion without pain, normal range of motion and neck supple.     Right lower leg: No edema.     Left lower leg: No edema.  Skin:    General: Skin is warm and dry.     Capillary Refill: Capillary refill takes less than 2 seconds.  Neurological:     General: No focal deficit present.     Mental Status: She is alert and oriented to person, place, and time.     Cranial Nerves: No cranial nerve deficit.     Sensory: No sensory deficit.  Psychiatric:        Mood and Affect: Mood normal.        Behavior: Behavior normal.        Thought Content: Thought content normal.        Judgment: Judgment normal.        Assessment And Plan:     1. Encounter for annual physical exam Behavior modifications discussed and diet history reviewed.   Pt will continue to exercise regularly and modify diet with low GI, plant based foods and decrease  intake of processed foods.  Recommend intake of daily multivitamin, Vitamin D, and calcium.  Recommend mammogram and colonoscopy (up to date) for preventive screenings, as well as recommend immunizations that include influenza, TDAP, and Shingles (Rx sent to pharmacy) Manual breast exam deferred due to having mammogram done 2 weeks ago - normal - CBC  2. Encounter for immunization - Zoster Vaccine Adjuvanted [facility]) injection; Inject 0.5 mLs into the muscle once for 1 dose.  Dispense: 0.5 mL; Refill: 0  3. Overweight with body mass index (BMI) of 27 to 27.9 in adult She is encouraged to strive for BMI less than 25.  Advised to aim for at least 150 minutes of exercise per week.  4. Essential hypertension Comments: Well controlled, continue current medications EKG done with NSR HR 66 - POCT Urinalysis Dipstick (81002) - POCT UA - Microalbumin - EKG 12-Lead  5. Prediabetes Comments: Stable, no current medications - Hemoglobin A1c  6. Elevated LDL cholesterol level Comments: Improving at last visit.  Continue to limit intake of fried and fatty foods - CMP14+EGFR - Lipid panel  7. Malignant neoplasm of upper-outer quadrant of right breast in female, estrogen receptor positive ([place]) Comments: Continue follow up with Oncology, recent mammogram done normal  8. Bilateral impacted cerumen Comments: Water lavage done with good results - Ear Lavage She is encouraged to strive for BMI less than 26 to decrease cardiac risk. Advised to aim for at least 150 minutes of exercise per week.     Patient was given opportunity to ask questions. Patient verbalized understanding of the plan and was able to repeat key elements of the plan. All questions were answered to their satisfaction.   Minette Brine, FNP   I, Minette Brine, FNP, have reviewed all documentation for this visit. The documentation on 05/11/21 for the exam, diagnosis,  procedures, and orders are all accurate and complete.  THE  PATIENT IS ENCOURAGED TO PRACTICE SOCIAL DISTANCING DUE TO THE COVID-19 PANDEMIC.

## 2021-05-12 ENCOUNTER — Encounter: Payer: Self-pay | Admitting: Hematology and Oncology

## 2021-05-12 LAB — CMP14+EGFR
ALT: 13 IU/L (ref 0–32)
AST: 18 IU/L (ref 0–40)
Albumin/Globulin Ratio: 1.6 (ref 1.2–2.2)
Albumin: 4.6 g/dL (ref 3.8–4.8)
Alkaline Phosphatase: 74 IU/L (ref 44–121)
BUN/Creatinine Ratio: 21 (ref 12–28)
BUN: 16 mg/dL (ref 8–27)
Bilirubin Total: 0.4 mg/dL (ref 0.0–1.2)
CO2: 25 mmol/L (ref 20–29)
Calcium: 10.1 mg/dL (ref 8.7–10.3)
Chloride: 100 mmol/L (ref 96–106)
Creatinine, Ser: 0.76 mg/dL (ref 0.57–1.00)
Globulin, Total: 2.8 g/dL (ref 1.5–4.5)
Glucose: 94 mg/dL (ref 65–99)
Potassium: 3.9 mmol/L (ref 3.5–5.2)
Sodium: 140 mmol/L (ref 134–144)
Total Protein: 7.4 g/dL (ref 6.0–8.5)
eGFR: 86 mL/min/{1.73_m2} (ref >59)

## 2021-05-12 LAB — HEMOGLOBIN A1C
Est. average glucose Bld gHb Est-mCnc: 117 mg/dL
Hgb A1c MFr Bld: 5.7 % — ABNORMAL HIGH (ref 4.8–5.6)

## 2021-05-12 LAB — CBC
Hematocrit: 35.8 % (ref 34.0–46.6)
Hemoglobin: 12 g/dL (ref 11.1–15.9)
MCH: 30.7 pg (ref 26.6–33.0)
MCHC: 33.5 g/dL (ref 31.5–35.7)
MCV: 92 fL (ref 79–97)
Platelets: 256 10*3/uL (ref 150–450)
RBC: 3.91 x10E6/uL (ref 3.77–5.28)
RDW: 12.5 % (ref 11.7–15.4)
WBC: 3.9 10*3/uL (ref 3.4–10.8)

## 2021-05-12 LAB — LIPID PANEL
Chol/HDL Ratio: 2.8 ratio (ref 0.0–4.4)
Cholesterol, Total: 186 mg/dL (ref 100–199)
HDL: 67 mg/dL (ref >39)
LDL Chol Calc (NIH): 106 mg/dL — ABNORMAL HIGH (ref 0–99)
Triglycerides: 67 mg/dL (ref 0–149)
VLDL Cholesterol Cal: 13 mg/dL (ref 5–40)

## 2021-05-22 ENCOUNTER — Telehealth: Payer: Self-pay | Admitting: *Deleted

## 2021-05-22 NOTE — Telephone Encounter (Addendum)
Called and spoke with patient to remind her of scheduled appointments for 05/23/21. Reminded patient to fast 3 hours  prior to labs being drawn,wear comfortable shoes for walking, shirt easy to remove arms and bring a snack if  desired. Patient confirmed correct e-mail address on file but have not completed questionnaires, will complete before visit on 05/23/21.

## 2021-05-23 ENCOUNTER — Inpatient Hospital Stay: Payer: Medicare Other | Admitting: *Deleted

## 2021-05-23 ENCOUNTER — Inpatient Hospital Stay: Payer: Medicare Other | Attending: Genetic Counselor

## 2021-05-23 ENCOUNTER — Ambulatory Visit (HOSPITAL_COMMUNITY)
Admission: RE | Admit: 2021-05-23 | Discharge: 2021-05-23 | Disposition: A | Discharge status: Home / Self Care | Payer: Medicare Other | Source: Ambulatory Visit | Attending: Hematology and Oncology | Admitting: Hematology and Oncology

## 2021-05-23 ENCOUNTER — Other Ambulatory Visit: Payer: Self-pay

## 2021-05-23 ENCOUNTER — Encounter: Payer: Self-pay | Admitting: *Deleted

## 2021-05-23 ENCOUNTER — Inpatient Hospital Stay: Payer: Medicare Other

## 2021-05-23 DIAGNOSIS — Z170 Estrogen receptor positive status [ER+]: Secondary | ICD-10-CM

## 2021-05-23 DIAGNOSIS — C50411 Malignant neoplasm of upper-outer quadrant of right female breast: Secondary | ICD-10-CM

## 2021-05-23 DIAGNOSIS — C50919 Malignant neoplasm of unspecified site of unspecified female breast: Secondary | ICD-10-CM

## 2021-05-23 LAB — RESEARCH LABS

## 2021-05-23 NOTE — Research (Signed)
WF [id number] Understanding and Predicting Breast Cancer Events after Treatment (UPBEAT)  TRIAL    Patient arrives today@ 8 am Unaccompanied for her 24 month visit. The pt will come back on Friday, 05/26/21 to complete her month 24 visit and get her month 24 gift card.    PROs:   The pt agreed to complete her questionnaires online.  However, the pt said that she has not received her emailed questionnaires.  The research nurse offered the pt the option to complete paper questionnaires for this time point.  The pt said that she would prefer to complete paper questionnaires for this visit.  The pt was given paper copies of the following questionnaires:  Self-administered questionnaires, Covid-19 questionnaire, Social Determinants, and the [facility] Cardiomyopathy.  The pt said that she will complete these questionnaires at home and return them at her next visit on 05/26/21.   LABS: Mandatory month 24 blood samples are collected per consent and study protocol: [name] tolerated well without complaint.   MEDICATION REVIEW: Patient reviews and verifies the current medication list is correct.  She states that she has not used the prescribed EPI-PEN yet. She states she has developed an allergic reaction to tomatoes. The pt is continuing to take anastrozole 1 mg daily.     CARDIAC MRI: The study required cardiac MRI is completed per protocol and patient tolerated without complaint.  The patient was thanked for their time and continued voluntary participation in this study. [name] has been provided direct contact information and is encouraged to contact this Nurse for any needs or questions.  The pt's next visit is scheduled for 05/26/21 at [place] RN, BSN, CCRP Clinical Research Nurse Lead 05/23/2021 11:43 AM

## 2021-05-26 ENCOUNTER — Inpatient Hospital Stay: Payer: Medicare Other

## 2021-05-26 ENCOUNTER — Inpatient Hospital Stay: Payer: Medicare Other | Admitting: *Deleted

## 2021-05-26 ENCOUNTER — Encounter: Payer: Self-pay | Admitting: *Deleted

## 2021-05-26 ENCOUNTER — Other Ambulatory Visit: Payer: Self-pay

## 2021-05-26 ENCOUNTER — Encounter: Payer: Self-pay | Admitting: Hematology and Oncology

## 2021-05-26 DIAGNOSIS — C50411 Malignant neoplasm of upper-outer quadrant of right female breast: Secondary | ICD-10-CM

## 2021-05-26 NOTE — Research (Signed)
WF [id number] Understanding and Predicting Breast Cancer Events after Treatment (UPBEAT)    Patient arrives today'@1'$  pm Unaccompanied to complete her month 24 UpBEAT assessments.    PROs: Per study protocol, all questionnaires (self-administered, social determinants, [place] Cardiomyopathy, Covid-19) required for this visit were completed at the respondent's home prior to her appointment this afternoon.  The pt said she preferred doing them on paper.  The research staff reviewed the questionnaires for completeness and accuracy.  The research nurse reviewed the pt's medical chart and completed the Cardiovascular Event Form at her last visit on 05/23/21.  The research nurse asked the pt if she had been hospitalized since her last visit.  The pt confirmed that she has not been hospitalized.  The pt also denies any cardiac problems or diagnoses since her last visit.     LABS: The pt's research labs results were faxed this morning to the research department.  Dr. Lindi Adie reviewed the results, and he felt that her 1 abnormal lab value ( LDL cholesterol calculation) was "not clinically significant".  The nurse showed the pt's her results and explained that no interventions are required for these lab values.  The nurse gave the pt a copy of her lab results and encouraged her to share the results with her PCP.     VITAL SIGNS: Vital signs are collected per study protocol. The pt's waist measurement was 35 inches.    NEUROCOGNITIVE ASSESSMENT: Neurocognitive assessment is completed by Farris Has.   PHYSICAL TESTING: Physical tests are completed by this clinical research Nurse with the assistance of clinical research Coordinator, Clabe Seal, EMT.    GIFT CARD: This study does provide visit compensation for completion of this visit.  The pt was given her $81 [facility] gift card.    DISPOSITION: Upon completion off all study requirements, the patient was thanked for her time and continued voluntary participation in this  study. The pt was told that the nurse would call her once she receives the pt's cardiac MRI results.  The pt was told that it may take several weeks to get the results sent to Dr. Lindi Adie.  The pt verbalized understanding. [name] has been provided direct contact information and is encouraged to contact this Nurse for any needs or questions.  Brion Aliment RN, BSN, CCRP Clinical Research Nurse Lead 05/26/2021 4:26 PM

## 2021-06-07 ENCOUNTER — Encounter: Payer: Self-pay | Admitting: Hematology and Oncology

## 2021-06-07 ENCOUNTER — Ambulatory Visit (INDEPENDENT_AMBULATORY_CARE_PROVIDER_SITE_OTHER): Payer: Medicare Other

## 2021-06-07 ENCOUNTER — Other Ambulatory Visit: Payer: Self-pay

## 2021-06-07 ENCOUNTER — Telehealth: Payer: Self-pay | Admitting: *Deleted

## 2021-06-07 VITALS — BP 126/78 | HR 68 | Temp 98.9°F | Ht 67.0 in | Wt 164.6 lb

## 2021-06-07 DIAGNOSIS — Z23 Encounter for immunization: Secondary | ICD-10-CM | POA: Diagnosis not present

## 2021-06-07 DIAGNOSIS — Z0000 Encounter for general adult medical examination without abnormal findings: Secondary | ICD-10-CM

## 2021-06-07 NOTE — Progress Notes (Signed)
This visit occurred during the SARS-CoV-2 public health emergency.  Safety protocols were in place, including screening questions prior to the visit, additional usage of staff PPE, and extensive cleaning of exam room while observing appropriate contact time as indicated for disinfecting solutions.  Subjective:   Emily Ross is a 65 y.o. female who presents for an Initial Medicare Annual Wellness Visit.  Review of Systems     Cardiac Risk Factors include: advanced age (>110mn, >>30women);hypertension     Objective:    Today's Vitals   06/07/21 1146  BP: 126/78  Pulse: 68  Temp: 98.9 F (37.2 C)  TempSrc: Oral  SpO2: 96%  Weight: 164 lb 9.6 oz (74.7 kg)  Height: '5\' 7"'$  (1.702 m)   Body mass index is 25.78 kg/m.  Advanced Directives 06/07/2021 05/10/2020 05/10/2020 04/08/2020 02/26/2020 12/04/2019 08/11/2019  Does Patient Have a Medical Advance Directive? No No No No No No No  Would patient like information on creating a medical advance directive? No - Patient declined Yes (MAU/Ambulatory/Procedural Areas - Information given) No - Patient declined No - Patient declined No - Patient declined No - Patient declined No - Patient declined    Current Medications (verified) Outpatient Encounter Medications as of 06/07/2021  Medication Sig   anastrozole (ARIMIDEX) 1 MG tablet Take 1 tablet (1 mg total) by mouth daily.   carboxymethylcellul-glycerin (LUBRICATING EYE DROPS) 0.5-0.9 % ophthalmic solution Place 1 drop into both eyes as needed (dry eyes).   Cholecalciferol (DIALYVITE VITAMIN D 5000) 125 MCG (5000 UT) capsule Take 5,000 Units by mouth daily.   diphenhydrAMINE (BENADRYL) 25 MG tablet Take 25 mg by mouth daily as needed for allergies.   hydrochlorothiazide (HYDRODIURIL) 25 MG tablet TAKE ONE TABLET BY MOUTH ONE TIME DAILY   ibuprofen (ADVIL) 600 MG tablet Take 1 tablet (600 mg total) by mouth every 6 (six) hours as needed.   metoprolol tartrate (LOPRESSOR) 50 MG tablet TAKE ONE TABLET  BY MOUTH TWICE DAILY   simvastatin (ZOCOR) 10 MG tablet TAKE ONE TABLET BY MOUTH DAILY IN THE EVENING   EPINEPHrine (EPIPEN 2-PAK) 0.3 mg/0.3 mL IJ SOAJ injection Inject 0.3 mg into the muscle as needed for anaphylaxis. (Patient not taking: Reported on 06/07/2021)   No facility-administered encounter medications on file as of 06/07/2021.    Allergies (verified) Tomato   History: Past Medical History:  Diagnosis Date   [place] ([facility]    [place] ([facility]    2009 left breast 2020 right breast   Family history of breast cancer    Family history of stomach cancer    High cholesterol    History of radiation therapy 08/23/19- 09/21/19   Right breast 15 fx of 2.67 Gy to total 40.05 Gy. Right Breast boost 5 fx of 2 Gy each to total 10 Gy   History of seasonal allergies    Hypertension    Pneumonia    Wears glasses    Past Surgical History:  Procedure Laterality Date   AXILLARY SENTINEL NODE BIOPSY Right 04/17/2019   Procedure: Right Axillary Sentinel Node Biopsy;  Surgeon: WRolm Bookbinder MD;  [facility]  Service: General;  Laterality: Right;   BREAST LUMPECTOMY     BREAST LUMPECTOMY WITH RADIOACTIVE SEED AND SENTINEL LYMPH NODE BIOPSY Right 04/17/2019   Procedure: RIGHT BREAST LUMPECTOMY WITH RADIOACTIVE SEED;  Surgeon: WRolm Bookbinder MD;  [facility]  Service: General;  Laterality: Right;   COLONOSCOPY     PORTACATH PLACEMENT Right 04/17/2019   Procedure:  INSERTION PORT-A-CATH WITH ULTRASOUND;  Surgeon: Rolm Bookbinder, MD;  Location: [facility];  Service: General;  Laterality: Right;   TUBAL LIGATION     WISDOM TOOTH EXTRACTION     Family History  Problem Relation Age of Onset   Diabetes Father    Hypertension Father    Stomach cancer Father 58   Diabetes Mother    Hypertension Mother    Breast cancer Sister 61       negative genetic testing   Breast cancer Maternal Aunt 80   Colon cancer Neg Hx    Esophageal cancer Neg Hx    Rectal cancer Neg Hx    Social  History   Socioeconomic History   Marital status: Married    Spouse name: Not on file   Number of children: Not on file   Years of education: Not on file   Highest education level: Not on file  Occupational History   Not on file  Tobacco Use   Smoking status: Never   Smokeless tobacco: Never  Vaping Use   Vaping Use: Never used  Substance and Sexual Activity   Alcohol use: No   Drug use: No   Sexual activity: Yes    Birth control/protection: Post-menopausal, None  Other Topics Concern   Not on file  Social History Narrative   ** Merged History Encounter **       Social Determinants of Health   Financial Resource Strain: Low Risk    Difficulty of Paying Living Expenses: Not hard at all  Food Insecurity: Not on file  Transportation Needs: No Transportation Needs   Lack of Transportation (Medical): No   Lack of Transportation (Non-Medical): No  Physical Activity: Insufficiently Active   Days of Exercise per Week: 4 days   Minutes of Exercise per Session: 30 min  Stress: No Stress Concern Present   Feeling of Stress : Not at all  Social Connections: Not on file    Tobacco Counseling Counseling given: Not Answered   Clinical Intake:  Pre-visit preparation completed: Yes  Pain : No/denies pain     Nutritional Status: BMI 25 -29 Overweight Nutritional Risks: None Diabetes: No  How often do you need to have someone help you when you read instructions, pamphlets, or other written materials from your doctor or pharmacy?: 1 - Never What is the last grade level you completed in school?: asociates degree  Diabetic? no  Interpreter Needed?: No  Information entered by :: NAllen LPN   Activities of Daily Living In your present state of health, do you have any difficulty performing the following activities: 06/07/2021 05/11/2021  Hearing? N N  Vision? N N  Difficulty concentrating or making decisions? N N  Walking or climbing stairs? N N  Dressing or bathing? N N   Doing errands, shopping? N N  Preparing Food and eating ? N -  Using the Toilet? N -  In the past six months, have you accidently leaked urine? N -  Do you have problems with loss of bowel control? N -  Managing your Medications? N -  Managing your Finances? N -  Housekeeping or managing your Housekeeping? N -  Some recent data might be hidden    Patient Care Team: Minette Brine, FNP as PCP - General (General Practice) Nicholas Lose, MD as Consulting Physician (Hematology and Oncology) Rolm Bookbinder, MD as Consulting Physician (General Surgery) Eppie Gibson, MD as Attending Physician (Radiation Oncology)  Indicate any recent Medical Services you may have received  from other than [facility] providers in the past year (date may be approximate).     Assessment:   This is a routine wellness examination for [place].  Hearing/Vision screen No results found.  Dietary issues and exercise activities discussed: Current Exercise Habits: Home exercise routine, Type of exercise: walking, Time (Minutes): 30, Frequency (Times/Week): 4, Weekly Exercise (Minutes/Week): 120   Goals Addressed             This Visit's Progress    Patient Stated       06/07/2021, maintain a healthy weight and stay flexible       Depression Screen PHQ 2/9 Scores 06/07/2021 05/11/2021 05/10/2020 10/22/2019 04/21/2019 11/12/2018 10/28/2018  PHQ - 2 Score 0 0 0 0 1 0 0  PHQ- 9 Score - 0 - - - - -    Fall Risk Fall Risk  06/07/2021 05/11/2021 05/10/2020 10/22/2019 04/21/2019  Falls in the past year? 0 0 0 0 0  Number falls in past yr: - 0 0 - -  Injury with Fall? - 0 - - -  Risk for fall due to : Medication side effect - - - -  Follow up Falls evaluation completed;Education provided;Falls prevention discussed - - - -    FALL RISK PREVENTION PERTAINING TO THE HOME:  Any stairs in or around the home? Yes  If so, are there any without handrails? No  Home free of loose throw rugs in walkways, pet beds, electrical  cords, etc? Yes  Adequate lighting in your home to reduce risk of falls? Yes   ASSISTIVE DEVICES UTILIZED TO PREVENT FALLS:  Life alert? No  Use of a cane, walker or w/c? No  Grab bars in the bathroom? No  Shower chair or bench in shower? No  Elevated toilet seat or a handicapped toilet? Yes   TIMED UP AND GO:  Was the test performed? No .    Gait steady and fast without use of assistive device  Cognitive Function:     6CIT Screen 06/07/2021 05/10/2020  What Year? 0 points 0 points  What month? 0 points 0 points  What time? 0 points 0 points  Count back from 20 0 points 0 points  Months in reverse 0 points 0 points  Repeat phrase 4 points 0 points  Total Score 4 0    Immunizations Immunization History  Administered Date(s) Administered   Influenza, High Dose Seasonal PF 07/04/2020   Influenza,inj,Quad PF,6+ Mos 06/12/2019   PFIZER(Purple Top)SARS-COV-2 Vaccination 12/05/2019, 12/26/2019, 07/06/2020   Pneumococcal Polysaccharide-23 11/15/2020   Tdap 05/22/2011, 04/09/2018    TDAP status: Up to date  Flu Vaccine status: Completed at today's visit  Pneumococcal vaccine status: Up to date  Covid-19 vaccine status: Completed vaccines  Qualifies for Shingles Vaccine? Yes   Zostavax completed No   Shingrix Completed?: No.    Education has been provided regarding the importance of this vaccine. Patient has been advised to call insurance company to determine out of pocket expense if they have not yet received this vaccine. Advised may also receive vaccine at local pharmacy or Health Dept. Verbalized acceptance and understanding.  Screening Tests Health Maintenance  Topic Date Due   Zoster Vaccines- Shingrix (1 of 2) Never done   COVID-19 Vaccine (4 - Booster for Pfizer series) 10/06/2020   INFLUENZA VACCINE  05/01/2021   PNA vac Low Risk Adult (2 of 2 - PCV13) 11/15/2021   MAMMOGRAM  04/25/2022   TETANUS/TDAP  04/09/2028   COLONOSCOPY (Pts 45-40yr Insurance  coverage  will need to be confirmed)  12/02/2028   DEXA SCAN  Completed   Hepatitis C Screening  Completed   HPV VACCINES  Aged Out    Health Maintenance  Health Maintenance Due  Topic Date Due   Zoster Vaccines- Shingrix (1 of 2) Never done   COVID-19 Vaccine (4 - Booster for Pfizer series) 10/06/2020   INFLUENZA VACCINE  05/01/2021    Colorectal cancer screening: Type of screening: Colonoscopy. Completed 12/03/2018. Repeat every 10 years  Mammogram status: Completed 04/25/2021. Repeat every year  Bone Density status: Completed 12/30/2019.  Lung Cancer Screening: (Low Dose CT Chest recommended if Age 53-80 years, 30 pack-year currently smoking OR have quit w/in 15years.) does not qualify.   Lung Cancer Screening Referral: no  Additional Screening:  Hepatitis C Screening: does qualify; Completed 03/04/2017  Vision Screening: Recommended annual ophthalmology exams for early detection of glaucoma and other disorders of the eye. Is the patient up to date with their annual eye exam?  Yes  Who is the provider or what is the name of the office in which the patient attends annual eye exams? Can't remember name If pt is not established with a provider, would they like to be referred to a provider to establish care? No .   Dental Screening: Recommended annual dental exams for proper oral hygiene  Community Resource Referral / Chronic Care Management: CRR required this visit?  No   CCM required this visit?  No      Plan:     I have personally reviewed and noted the following in the patient's chart:   Medical and social history Use of alcohol, tobacco or illicit drugs  Current medications and supplements including opioid prescriptions. Patient is not currently taking opioid prescriptions. Functional ability and status Nutritional status Physical activity Advanced directives List of other physicians Hospitalizations, surgeries, and ER visits in previous 12 months Vitals Screenings to  include cognitive, depression, and falls Referrals and appointments  In addition, I have reviewed and discussed with patient certain preventive protocols, quality metrics, and best practice recommendations. A written personalized care plan for preventive services as well as general preventive health recommendations were provided to patient.     Kellie Simmering, LPN   [id number]   Nurse Notes:

## 2021-06-07 NOTE — Telephone Encounter (Signed)
[facility] [id number] Understanding and Predicting Breast Cancer Events after Treatment (UPBEAT)   Research nurse received the pt's [facility] Clinical report today.  The report was negative for "alert findings".  Dr. Lindi Adie reviewed the report, and he felt the comment "mild increase in basal septal wall thickness (13 mm at end diastole).  Not an alert value" was "Not Clinically Significant".  The research nurse called and informed the pt about her report.  The pt said that she was happy that it was negative for any alert findings.  The pt was informed that the cardiologist commented on the mild increase in the basal septal wall thickness.  2 copies of the [facility] Clinical Report will be mailed to the pt for her reference.  The pt was encouraged to share the results with her primary care physician.  The pt was thanked again for her study participation.  The pt knows to call the research nurse or Dr. Lindi Adie with any further research concerns or questions.  The pt verbalized understanding.  Brion Aliment RN, BSN, [place] Nurse Lead 06/07/2021 4:57 PM

## 2021-06-07 NOTE — Patient Instructions (Signed)
Emily Ross , Thank you for taking time to come for your Medicare Wellness Visit. I appreciate your ongoing commitment to your health goals. Please review the following plan we discussed and let me know if I can assist you in the future.   Screening recommendations/referrals: Colonoscopy: completed 12/03/2018 Mammogram: completed 04/25/2021 Bone Density: completed 12/30/2019 Recommended yearly ophthalmology/optometry visit for glaucoma screening and checkup Recommended yearly dental visit for hygiene and checkup  Vaccinations: Influenza vaccine: today Pneumococcal vaccine: completed 11/15/2020 Tdap vaccine: completed 04/09/2018 Shingles vaccine: discussed   Covid-19: 03/11/2021, 07/06/2020, 12/26/2019, 12/05/2019  Advanced directives: Advance directive discussed with you today. Even though you declined this today please call our office should you change your mind and we can give you the proper paperwork for you to fill out.  Conditions/risks identified: none  Next appointment: Follow up in one year for your annual wellness visit    Preventive Care 65 Years and Older, Female Preventive care refers to lifestyle choices and visits with your health care provider that can promote health and wellness. What does preventive care include? A yearly physical exam. This is also called an annual well check. Dental exams once or twice a year. Routine eye exams. Ask your health care provider how often you should have your eyes checked. Personal lifestyle choices, including: Daily care of your teeth and gums. Regular physical activity. Eating a healthy diet. Avoiding tobacco and drug use. Limiting alcohol use. Practicing safe sex. Taking low-dose aspirin every day. Taking vitamin and mineral supplements as recommended by your health care provider. What happens during an annual well check? The services and screenings done by your health care provider during your annual well check will depend on your age,  overall health, lifestyle risk factors, and family history of disease. Counseling  Your health care provider may ask you questions about your: Alcohol use. Tobacco use. Drug use. Emotional well-being. Home and relationship well-being. Sexual activity. Eating habits. History of falls. Memory and ability to understand (cognition). Work and work Statistician. Reproductive health. Screening  You may have the following tests or measurements: Height, weight, and BMI. Blood pressure. Lipid and cholesterol levels. These may be checked every 5 years, or more frequently if you are over 33 years old. Skin check. Lung cancer screening. You may have this screening every year starting at age 84 if you have a 30-pack-year history of smoking and currently smoke or have quit within the past 15 years. Fecal occult blood test (FOBT) of the stool. You may have this test every year starting at age 55. Flexible sigmoidoscopy or colonoscopy. You may have a sigmoidoscopy every 5 years or a colonoscopy every 10 years starting at age 42. Hepatitis C blood test. Hepatitis B blood test. Sexually transmitted disease (STD) testing. Diabetes screening. This is done by checking your blood sugar (glucose) after you have not eaten for a while (fasting). You may have this done every 1-3 years. Bone density scan. This is done to screen for osteoporosis. You may have this done starting at age 64. Mammogram. This may be done every 1-2 years. Talk to your health care provider about how often you should have regular mammograms. Talk with your health care provider about your test results, treatment options, and if necessary, the need for more tests. Vaccines  Your health care provider may recommend certain vaccines, such as: Influenza vaccine. This is recommended every year. Tetanus, diphtheria, and acellular pertussis (Tdap, Td) vaccine. You may need a Td booster every 10 years. Zoster vaccine.  You may need this after age  5. Pneumococcal 13-valent conjugate (PCV13) vaccine. One dose is recommended after age 38. Pneumococcal polysaccharide (PPSV23) vaccine. One dose is recommended after age 93. Talk to your health care provider about which screenings and vaccines you need and how often you need them. This information is not intended to replace advice given to you by your health care provider. Make sure you discuss any questions you have with your health care provider. Document Released: 10/14/2015 Document Revised: 06/06/2016 Document Reviewed: 07/19/2015 Elsevier Interactive Patient Education  2017 [employer] [employer] Prevention in the Home Falls can cause injuries. They can happen to people of all ages. There are many things you can do to make your home safe and to help prevent falls. What can I do on the outside of my home? Regularly fix the edges of walkways and driveways and fix any cracks. Remove anything that might make you trip as you walk through a door, such as a raised step or threshold. Trim any bushes or trees on the path to your home. Use bright outdoor lighting. Clear any walking paths of anything that might make someone trip, such as rocks or tools. Regularly check to see if handrails are loose or broken. Make sure that both sides of any steps have handrails. Any raised decks and porches should have guardrails on the edges. Have any leaves, snow, or ice cleared regularly. Use sand or salt on walking paths during winter. Clean up any spills in your garage right away. This includes oil or grease spills. What can I do in the bathroom? Use night lights. Install grab bars by the toilet and in the tub and shower. Do not use towel bars as grab bars. Use non-skid mats or decals in the tub or shower. If you need to sit down in the shower, use a plastic, non-slip stool. Keep the floor dry. Clean up any water that spills on the floor as soon as it happens. Remove soap buildup in the tub or shower  regularly. Attach bath mats securely with double-sided non-slip rug tape. Do not have throw rugs and other things on the floor that can make you trip. What can I do in the bedroom? Use night lights. Make sure that you have a light by your bed that is easy to reach. Do not use any sheets or blankets that are too big for your bed. They should not hang down onto the floor. Have a firm chair that has side arms. You can use this for support while you get dressed. Do not have throw rugs and other things on the floor that can make you trip. What can I do in the kitchen? Clean up any spills right away. Avoid walking on wet floors. Keep items that you use a lot in easy-to-reach places. If you need to reach something above you, use a strong step stool that has a grab bar. Keep electrical cords out of the way. Do not use floor polish or wax that makes floors slippery. If you must use wax, use non-skid floor wax. Do not have throw rugs and other things on the floor that can make you trip. What can I do with my stairs? Do not leave any items on the stairs. Make sure that there are handrails on both sides of the stairs and use them. Fix handrails that are broken or loose. Make sure that handrails are as long as the stairways. Check any carpeting to make sure that it is  firmly attached to the stairs. Fix any carpet that is loose or worn. Avoid having throw rugs at the top or bottom of the stairs. If you do have throw rugs, attach them to the floor with carpet tape. Make sure that you have a light switch at the top of the stairs and the bottom of the stairs. If you do not have them, ask someone to add them for you. What else can I do to help prevent falls? Wear shoes that: Do not have high heels. Have rubber bottoms. Are comfortable and fit you well. Are closed at the toe. Do not wear sandals. If you use a stepladder: Make sure that it is fully opened. Do not climb a closed stepladder. Make sure that  both sides of the stepladder are locked into place. Ask someone to hold it for you, if possible. Clearly mark and make sure that you can see: Any grab bars or handrails. First and last steps. Where the edge of each step is. Use tools that help you move around (mobility aids) if they are needed. These include: Canes. Walkers. Scooters. Crutches. Turn on the lights when you go into a dark area. Replace any light bulbs as soon as they burn out. Set up your furniture so you have a clear path. Avoid moving your furniture around. If any of your floors are uneven, fix them. If there are any pets around you, be aware of where they are. Review your medicines with your doctor. Some medicines can make you feel dizzy. This can increase your chance of falling. Ask your doctor what other things that you can do to help prevent falls. This information is not intended to replace advice given to you by your health care provider. Make sure you discuss any questions you have with your health care provider. Document Released: 07/14/2009 Document Revised: 02/23/2016 Document Reviewed: 10/22/2014 Elsevier Interactive Patient Education  2017 [employer].

## 2021-07-24 ENCOUNTER — Other Ambulatory Visit: Payer: Self-pay | Admitting: Nurse Practitioner

## 2021-07-24 DIAGNOSIS — I10 Essential (primary) hypertension: Secondary | ICD-10-CM

## 2021-08-08 ENCOUNTER — Ambulatory Visit (INDEPENDENT_AMBULATORY_CARE_PROVIDER_SITE_OTHER): Payer: Medicare Other | Admitting: Nurse Practitioner

## 2021-08-08 ENCOUNTER — Other Ambulatory Visit: Payer: Self-pay

## 2021-08-08 ENCOUNTER — Encounter: Payer: Self-pay | Admitting: Nurse Practitioner

## 2021-08-08 ENCOUNTER — Other Ambulatory Visit: Payer: Self-pay | Admitting: Nurse Practitioner

## 2021-08-08 VITALS — BP 136/86 | HR 69 | Temp 98.1°F | Ht 67.0 in | Wt 160.8 lb

## 2021-08-08 DIAGNOSIS — E7800 Pure hypercholesterolemia, unspecified: Secondary | ICD-10-CM

## 2021-08-08 DIAGNOSIS — R7303 Prediabetes: Secondary | ICD-10-CM

## 2021-08-08 DIAGNOSIS — M2559 Pain in other specified joint: Secondary | ICD-10-CM

## 2021-08-08 DIAGNOSIS — I10 Essential (primary) hypertension: Secondary | ICD-10-CM

## 2021-08-08 NOTE — Patient Instructions (Signed)

## 2021-08-08 NOTE — Progress Notes (Signed)
I,Victoria T Hamilton,acting as a Education administrator for Minette Brine, FNP.,have documented all relevant documentation on the behalf of Minette Brine, FNP,as directed by  Minette Brine, FNP while in the presence of Minette Brine, [place].   This visit occurred during the SARS-CoV-2 public health emergency.  Safety protocols were in place, including screening questions prior to the visit, additional usage of staff PPE, and extensive cleaning of exam room while observing appropriate contact time as indicated for disinfecting solutions.  Subjective:     Patient ID: Emily Ross , female    DOB: Feb 25, 1956 , 65 y.o.   MRN: [id number]   Chief Complaint  Patient presents with   Hypertension    HPI  Pt here for f/u hypertension. She has no specific questions or concerns at this time. She reports to receiving her covid booster immunization. No dates given , she will call at a later time to give those.     Past Medical History:  Diagnosis Date   Breast cancer ([place])    Cancer ([place])    2009 left breast 2020 right breast   Family history of breast cancer    Family history of stomach cancer    High cholesterol    History of radiation therapy 08/23/19- 09/21/19   Right breast 15 fx of 2.67 Gy to total 40.05 Gy. Right Breast boost 5 fx of 2 Gy each to total 10 Gy   History of seasonal allergies    Hypertension    Pneumonia    Wears glasses      Family History  Problem Relation Age of Onset   Diabetes Father    Hypertension Father    Stomach cancer Father 51   Diabetes Mother    Hypertension Mother    Breast cancer Sister 44       negative genetic testing   Breast cancer Maternal Aunt 80   Colon cancer Neg Hx    Esophageal cancer Neg Hx    Rectal cancer Neg Hx      Current Outpatient Medications:    anastrozole (ARIMIDEX) 1 MG tablet, Take 1 tablet (1 mg total) by mouth daily., Disp: 90 tablet, Rfl: 3   carboxymethylcellul-glycerin (LUBRICATING EYE DROPS) 0.5-0.9 % ophthalmic solution, Place 1  drop into both eyes as needed (dry eyes)., Disp: 30 mL, Rfl: 1   Cholecalciferol (DIALYVITE VITAMIN D 5000) 125 MCG (5000 UT) capsule, Take 5,000 Units by mouth daily., Disp: , Rfl:    diphenhydrAMINE (BENADRYL) 25 MG tablet, Take 25 mg by mouth daily as needed for allergies., Disp: , Rfl:    hydrochlorothiazide (HYDRODIURIL) 25 MG tablet, TAKE ONE TABLET BY MOUTH ONE TIME DAILY, Disp: 90 tablet, Rfl: 0   ibuprofen (ADVIL) 600 MG tablet, Take 1 tablet (600 mg total) by mouth every 6 (six) hours as needed., Disp: 30 tablet, Rfl: 2   metoprolol tartrate (LOPRESSOR) 50 MG tablet, TAKE ONE TABLET BY MOUTH TWICE DAILY, Disp: 180 tablet, Rfl: 0   simvastatin (ZOCOR) 10 MG tablet, TAKE ONE TABLET BY MOUTH DAILY IN THE EVENING, Disp: 90 tablet, Rfl: 0   EPINEPHrine (EPIPEN 2-PAK) 0.3 mg/0.3 mL IJ SOAJ injection, Inject 0.3 mg into the muscle as needed for anaphylaxis. (Patient not taking: Reported on 06/07/2021), Disp: 1 each, Rfl: 0   Allergies  Allergen Reactions   Tomato      Review of Systems  Constitutional: Negative.   Respiratory: Negative.    Cardiovascular: Negative.  Negative for chest pain, palpitations and leg swelling.  Neurological:  Negative.   Psychiatric/Behavioral: Negative.      Today's Vitals   08/08/21 0941 08/08/21 1009  BP: (!) 146/88 136/86  Pulse: 69   Temp: 98.1 F (36.7 C)   Weight: 160 lb 12.8 oz (72.9 kg)   Height: 5\' 7"  (1.702 m)    Body mass index is 25.18 kg/m.  Wt Readings from Last 3 Encounters:  08/08/21 160 lb 12.8 oz (72.9 kg)  06/07/21 164 lb 9.6 oz (74.7 kg)  05/26/21 159 lb 14.4 oz (72.5 kg)    Objective:  Physical Exam Vitals reviewed.  Constitutional:      General: She is not in acute distress.    Appearance: Normal appearance.  Cardiovascular:     Rate and Rhythm: Normal rate and regular rhythm.     Pulses: Normal pulses.     Heart sounds: Normal heart sounds. No murmur heard. Pulmonary:     Effort: Pulmonary effort is normal. No  respiratory distress.     Breath sounds: Normal breath sounds. No wheezing.  Neurological:     General: No focal deficit present.     Mental Status: She is alert and oriented to person, place, and time.     Cranial Nerves: No cranial nerve deficit.     Motor: No weakness.  Psychiatric:        Mood and Affect: Mood normal.        Behavior: Behavior normal.        Thought Content: Thought content normal.        Judgment: Judgment normal.        Assessment And Plan:     1. Essential hypertension Comments: Blood pressure was slightly elevated initially the recheck was improved to 138/88, no changes at this time. Encouraged to stay well hydrated with water.   2. Prediabetes Comments: Stable, diet controlled. Continue current medications  3. Elevated LDL cholesterol level Comments: Stable, continue to avoid fried and fatty foods  4. Pain in other joint Comments: will check vitamin d, she is on a chemo medication that has a side effect of joint pain but would like to check for any other possible cause - VITAMIN D 25 Hydroxy (Vit-D Deficiency, Fractures)     Patient was given opportunity to ask questions. Patient verbalized understanding of the plan and was able to repeat key elements of the plan. All questions were answered to their satisfaction.  Minette Brine, FNP   I, Minette Brine, FNP, have reviewed all documentation for this visit. The documentation on 08/08/21 for the exam, diagnosis, procedures, and orders are all accurate and complete.   IF YOU HAVE BEEN REFERRED TO A SPECIALIST, IT MAY TAKE 1-2 WEEKS TO SCHEDULE/PROCESS THE REFERRAL. IF YOU HAVE NOT HEARD FROM US/SPECIALIST IN TWO WEEKS, PLEASE GIVE [place] A CALL AT [phone].   THE PATIENT IS ENCOURAGED TO PRACTICE SOCIAL DISTANCING DUE TO THE COVID-19 PANDEMIC.

## 2021-08-09 LAB — VITAMIN D 25 HYDROXY (VIT D DEFICIENCY, FRACTURES): Vit D, 25-Hydroxy: 75.8 ng/mL (ref 30.0–100.0)

## 2021-10-04 ENCOUNTER — Other Ambulatory Visit: Payer: Self-pay | Admitting: Obstetrics and Gynecology

## 2021-10-19 ENCOUNTER — Other Ambulatory Visit: Payer: Self-pay | Admitting: Nurse Practitioner

## 2021-10-19 ENCOUNTER — Other Ambulatory Visit: Payer: Self-pay | Admitting: Hematology and Oncology

## 2021-10-19 DIAGNOSIS — I10 Essential (primary) hypertension: Secondary | ICD-10-CM

## 2021-10-25 NOTE — Progress Notes (Signed)
Patient Care Team: Minette Brine, FNP as PCP - General (General Practice) Nicholas Lose, MD as Consulting Physician (Hematology and Oncology) Rolm Bookbinder, MD as Consulting Physician (General Surgery) Eppie Gibson, MD as Attending Physician (Radiation Oncology)  DIAGNOSIS:    ICD-10-CM   1. Malignant neoplasm of upper-outer quadrant of right breast in female, estrogen receptor positive ([place])  C50.411    Z17.0       SUMMARY OF ONCOLOGIC HISTORY: Oncology History  Malignant neoplasm of upper-outer quadrant of right breast in female, estrogen receptor positive ([place])  04/02/2019 Initial Diagnosis   Screening mammogram detected a mass in the right axilla. [place] confirmed a 0.9cm right breast mass. Biopsy showed IDC, grade 2, HER-2+ (3+), ER+ 100%, PR+ 5%, Ki67 15%.    04/08/2019 Cancer Staging   Staging form: Breast, AJCC 8th Edition - Clinical stage from 04/08/2019: Stage IA (cT1b, cN0, cM0, G2, ER+, PR+, HER2+) - Signed by Nicholas Lose, MD on 04/08/2019    04/14/2019 Genetic Testing   Patient had genetic testing done for her personal and family history of breast cancer.  Negative genetic testing on the [facility] Breast Cancer STAT panel. The STAT Breast cancer panel offered by [facility] includes sequencing and rearrangement analysis for the following 9 genes:  ATM, BRCA1, BRCA2, CDH1, CHEK2, PALB2, PTEN, STK11 and TP53.  The report date is 04/14/2019.   04/17/2019 Surgery   Right lumpectomy [name]): IDC, 2.0cm, grade 2, clear margins, 5 axillary lymph nodes negative for carcinoma   08/24/2019 - 09/21/2019 Radiation Therapy   Adjuvant radiation   10/01/2019 -  Anti-estrogen oral therapy   Anastrozole daily   Carcinoma of upper-inner quadrant of right breast in female, estrogen receptor positive ([place])  04/08/2019 Cancer Staging   Staging form: Breast, AJCC 8th Edition - Clinical stage from 04/08/2019: Stage IA (cT1b, cN0, cM0, G2, ER+, PR+, HER2+) - Signed by Eppie Gibson, MD on  04/08/2019    05/15/2019 - 04/29/2020 Chemotherapy   Patient is on Treatment Plan : BREAST weekly PACLitaxel / trastuzumab / Maintenance trastuzumab every 21 days       CHIEF COMPLIANT: Follow-up of right breast cancer on anastrozole  INTERVAL HISTORY: Emily Ross is a 66 y.o. with above-mentioned history of breast cancer who underwent a lumpectomy, adjuvant chemotherapy, radiation, Herceptin maintenance, and is currently on antiestrogen therapy with anastrozole. She is a participant in the [employer] clinical trial. She presents to the clinic today for follow-up.   ALLERGIES:  is allergic to tomato.  MEDICATIONS:  Current Outpatient Medications  Medication Sig Dispense Refill   anastrozole (ARIMIDEX) 1 MG tablet TAKE ONE TABLET BY MOUTH ONE TIME DAILY 90 tablet 0   carboxymethylcellul-glycerin (LUBRICATING EYE DROPS) 0.5-0.9 % ophthalmic solution Place 1 drop into both eyes as needed (dry eyes). 30 mL 1   Cholecalciferol (DIALYVITE VITAMIN D 5000) 125 MCG (5000 UT) capsule Take 5,000 Units by mouth daily.     diphenhydrAMINE (BENADRYL) 25 MG tablet Take 25 mg by mouth daily as needed for allergies.     EPINEPHrine (EPIPEN 2-PAK) 0.3 mg/0.3 mL IJ SOAJ injection Inject 0.3 mg into the muscle as needed for anaphylaxis. (Patient not taking: Reported on 06/07/2021) 1 each 0   hydrochlorothiazide (HYDRODIURIL) 25 MG tablet TAKE ONE TABLET BY MOUTH ONE TIME DAILY 90 tablet 0   ibuprofen (ADVIL) 600 MG tablet Take 1 tablet (600 mg total) by mouth every 6 (six) hours as needed. 30 tablet 2   metoprolol tartrate (LOPRESSOR) 50 MG tablet TAKE ONE  TABLET BY MOUTH TWICE DAILY 180 tablet 0   simvastatin (ZOCOR) 10 MG tablet TAKE ONE TABLET BY MOUTH DAILY IN THE EVENING 90 tablet 0   No current facility-administered medications for this visit.    PHYSICAL EXAMINATION: ECOG PERFORMANCE STATUS: 1 - Symptomatic but completely ambulatory  Vitals:   10/26/21 0958 10/26/21 1000  BP: (!) 160/81 (!) 160/81   Pulse: 64 64  Resp: 18 18  Temp: 97.9 F (36.6 C) 97.9 F (36.6 C)  SpO2: 99% 99%   Filed Weights   10/26/21 0958 10/26/21 1000  Weight: 158 lb 6.4 oz (71.8 kg) 158 lb 6.4 oz (71.8 kg)    BREAST: No palpable masses or nodules in either right or left breasts. No palpable axillary supraclavicular or infraclavicular adenopathy no breast tenderness or nipple discharge. (exam performed in the presence of a chaperone)  LABORATORY DATA:  I have reviewed the data as listed CMP Latest Ref Rng & Units 05/11/2021 11/15/2020 05/10/2020  Glucose 65 - 99 mg/dL 94 89 92  BUN 8 - 27 mg/dL _0 Creatinine 0.57 - 1.00 mg/dL 0.76 0.73 0.92  Sodium 134 - 144 mmol/L 140 138 141  Potassium 3.5 - 5.2 mmol/L 3.9 3.8 5.1  Chloride 96 - 106 mmol/L 100 98 100  CO2 20 - 29 mmol/L _1 Calcium 8.7 - 10.3 mg/dL 10.1 10.5(H) 10.7(H)  Total Protein 6.0 - 8.5 g/dL 7.4 7.5 7.5  Total Bilirubin 0.0 - 1.2 mg/dL 0.4 0.4 0.4  Alkaline Phos 44 - 121 IU/L 74 75 79  AST 0 - 40 IU/L _2 ALT 0 - 32 IU/L _3 Lab Results  Component Value Date   WBC 3.9 05/11/2021   HGB 12.0 05/11/2021   HCT 35.8 05/11/2021   MCV 92 05/11/2021   PLT 256 05/11/2021   NEUTROABS 1.6 (L) 04/29/2020    ASSESSMENT & PLAN:  Malignant neoplasm of upper-outer quadrant of right breast in female, estrogen receptor positive ([place]) 04/02/2019:Screening mammogram detected a mass in the right axilla. [place] confirmed a 0.9cm right breast mass. Biopsy showed IDC, grade 2, HER-2+ (3+), ER+ 100%, PR+ 5%, Ki67 15%.    Treatment plan: 1.  Breast conserving surgery with sentinel lymph node biopsy 2.  Adjuvant chemotherapy with Taxol Herceptin weekly x12 followed by Herceptin maintenance for 1 year started 05/15/2019 completed July 2021 3.  Adjuvant radiation therapy will be starting 08/22/2019-09/21/2019 4.  Followed by adjuvant antiestrogen therapy with anastrozole 1 mg daily x5 to 7 years started  11/10/2019 ------------------------------------------------------------------------------------------------------------------------------------------- Current Treatment: Anastrozole started 11/10/2019   Breast cancer surveillance: 1. Mamm[facility]: Benign, breast density category A 2. Breast exam 10/26/2021: Benign   Anastrozole toxicities: Denies any hot flashes or myalgias. Return to clinic in 1 year for follow-up    No orders of the defined types were placed in this encounter.  The patient has a good understanding of the overall plan. she agrees with it. she will call with any problems that may develop before the next visit here.  Total time spent: 20 mins including face to face time and time spent for planning, charting and coordination of care  Rulon Eisenmenger, MD, MPH 10/26/2021  I, Thana Ates, am acting as scribe for Dr. Nicholas Lose.  I have reviewed the above documentation for accuracy and completeness, and I agree with the above.

## 2021-10-25 NOTE — Assessment & Plan Note (Signed)
04/02/2019:Screening mammogram detected a mass in the right axilla. [place] confirmed a 0.9cm right breast mass. Biopsy showed IDC, grade 2, HER-2+ (3+), ER+ 100%, PR+ 5%, Ki67 15%.  Treatment plan: 1.Breast conserving surgery with sentinel lymph node biopsy 2.Adjuvant chemotherapy with Taxol Herceptin weekly x12 followed by Herceptin maintenance for 1 year started8/14/2020 completed July 2021 3.Adjuvant radiation therapywill be starting 08/22/2019-09/21/2019 4.Followed by adjuvant antiestrogen therapy with anastrozole 1 mg daily x5 to 7 yearsstarted2/06/2020 ------------------------------------------------------------------------------------------------------------------------------------------- Current Treatment:Anastrozole started 11/10/2019  Breast cancer surveillance: 1. M[facility]: Benign, breast density category A 2. Breast exam 10/26/2021: Benign  Anastrozole toxicities: Denies any hot flashes or myalgias. Return to clinicin 1 year for follow-up

## 2021-10-26 ENCOUNTER — Inpatient Hospital Stay: Payer: Medicare Other | Attending: Hematology and Oncology | Admitting: Hematology and Oncology

## 2021-10-26 ENCOUNTER — Other Ambulatory Visit: Payer: Self-pay

## 2021-10-26 DIAGNOSIS — Z170 Estrogen receptor positive status [ER+]: Secondary | ICD-10-CM | POA: Insufficient documentation

## 2021-10-26 DIAGNOSIS — Z79811 Long term (current) use of aromatase inhibitors: Secondary | ICD-10-CM | POA: Diagnosis not present

## 2021-10-26 DIAGNOSIS — Z923 Personal history of irradiation: Secondary | ICD-10-CM | POA: Diagnosis not present

## 2021-10-26 DIAGNOSIS — Z9221 Personal history of antineoplastic chemotherapy: Secondary | ICD-10-CM | POA: Insufficient documentation

## 2021-10-26 DIAGNOSIS — C50411 Malignant neoplasm of upper-outer quadrant of right female breast: Secondary | ICD-10-CM | POA: Diagnosis present

## 2021-10-26 MED ORDER — ANASTROZOLE 1 MG PO TABS: 1.0000 mg | ORAL_TABLET | Freq: Every day | ORAL | 3 refills | Status: DC

## 2021-11-03 ENCOUNTER — Other Ambulatory Visit: Payer: Self-pay | Admitting: Nurse Practitioner

## 2021-11-03 DIAGNOSIS — E7800 Pure hypercholesterolemia, unspecified: Secondary | ICD-10-CM

## 2021-11-03 DIAGNOSIS — I10 Essential (primary) hypertension: Secondary | ICD-10-CM

## 2021-11-09 ENCOUNTER — Other Ambulatory Visit: Payer: Self-pay

## 2021-11-09 ENCOUNTER — Encounter (HOSPITAL_BASED_OUTPATIENT_CLINIC_OR_DEPARTMENT_OTHER): Payer: Self-pay | Admitting: Obstetrics and Gynecology

## 2021-11-15 ENCOUNTER — Encounter (HOSPITAL_BASED_OUTPATIENT_CLINIC_OR_DEPARTMENT_OTHER)
Admission: RE | Admit: 2021-11-15 | Discharge: 2021-11-15 | Disposition: A | Discharge status: Home / Self Care | Payer: Medicare Other | Source: Ambulatory Visit | Attending: Obstetrics and Gynecology | Admitting: Obstetrics and Gynecology

## 2021-11-15 DIAGNOSIS — I10 Essential (primary) hypertension: Secondary | ICD-10-CM | POA: Insufficient documentation

## 2021-11-15 DIAGNOSIS — Z01812 Encounter for preprocedural laboratory examination: Secondary | ICD-10-CM | POA: Diagnosis present

## 2021-11-15 LAB — BASIC METABOLIC PANEL
Anion gap: 8 (ref 5–15)
BUN: 14 mg/dL (ref 8–23)
CO2: 30 mmol/L (ref 22–32)
Calcium: 10.2 mg/dL (ref 8.9–10.3)
Chloride: 101 mmol/L (ref 98–111)
Creatinine, Ser: 0.78 mg/dL (ref 0.44–1.00)
GFR, Estimated: >60 mL/min (ref >60)
Glucose, Bld: 104 mg/dL — ABNORMAL HIGH (ref 70–99)
Potassium: 3.7 mmol/L (ref 3.5–5.1)
Sodium: 139 mmol/L (ref 135–145)

## 2021-11-17 NOTE — Progress Notes (Addendum)
Spoke w/ via phone for pre-op interview---pt Lab needs dos----none               Lab results------bmp 11-15-2021 epic, ekg 05-11-2021 chart/epic echo 04-15-2020 ef 60 -65 % COVID test -----patient states asymptomatic no test needed Arrive at -------1000 am 11-20-2021 NPO after MN NO Solid Food.  Clear liquids from MN until---900 am Med rec completed Medications to take morning of surgery -----metoprolol,  anastrozole, famotidine Diabetic medication -----n/a Patient instructed no nail polish to be worn day of surgery Patient instructed to bring photo id and insurance card day of surgery Patient aware to have Driver (ride ) / caregiver   Emily Ross husband will stay  for 24 hours after surgery  Patient Special Instructions ----surgery ord req dr Emily Ross epic ib Pre-Op special Istructions -----none Patient verbalized understanding of instructions that were given at this phone interview. Patient denies shortness of breath, chest pain, fever, cough at this phone interview.   Medical history done 11-09-2021 by [facility] RN and pt states no changes in medical history since then.

## 2021-11-19 ENCOUNTER — Other Ambulatory Visit: Payer: Self-pay | Admitting: Obstetrics and Gynecology

## 2021-11-20 ENCOUNTER — Encounter (HOSPITAL_BASED_OUTPATIENT_CLINIC_OR_DEPARTMENT_OTHER)
Admission: RE | Disposition: A | Discharge status: Home / Self Care | Payer: Self-pay | Source: Home / Self Care | Attending: Obstetrics and Gynecology

## 2021-11-20 ENCOUNTER — Ambulatory Visit (HOSPITAL_BASED_OUTPATIENT_CLINIC_OR_DEPARTMENT_OTHER): Payer: Medicare Other | Admitting: Anesthesiology

## 2021-11-20 ENCOUNTER — Encounter (HOSPITAL_BASED_OUTPATIENT_CLINIC_OR_DEPARTMENT_OTHER): Payer: Self-pay | Admitting: Obstetrics and Gynecology

## 2021-11-20 ENCOUNTER — Other Ambulatory Visit: Payer: Self-pay

## 2021-11-20 ENCOUNTER — Ambulatory Visit (HOSPITAL_BASED_OUTPATIENT_CLINIC_OR_DEPARTMENT_OTHER)
Admission: RE | Admit: 2021-11-20 | Discharge: 2021-11-20 | Disposition: A | Discharge status: Home / Self Care | Payer: Medicare Other | Attending: Obstetrics and Gynecology | Admitting: Obstetrics and Gynecology

## 2021-11-20 DIAGNOSIS — I10 Essential (primary) hypertension: Secondary | ICD-10-CM

## 2021-11-20 DIAGNOSIS — D060 Carcinoma in situ of endocervix: Secondary | ICD-10-CM | POA: Insufficient documentation

## 2021-11-20 DIAGNOSIS — D069 Carcinoma in situ of cervix, unspecified: Secondary | ICD-10-CM

## 2021-11-20 HISTORY — PX: CERVICAL CONIZATION W/BX: SHX1330

## 2021-11-20 HISTORY — DX: Prediabetes: R73.03

## 2021-11-20 SURGERY — CONE BIOPSY, CERVIX
Anesthesia: General

## 2021-11-20 MED ORDER — OXYCODONE HCL 5 MG/5ML PO SOLN: 5.0000 mg | Freq: Once | ORAL | Status: DC | PRN

## 2021-11-20 MED ORDER — ACETAMINOPHEN 10 MG/ML IV SOLN: 1000.0000 mg | Freq: Once | INTRAVENOUS | Status: DC | PRN

## 2021-11-20 MED ORDER — SCOPOLAMINE 1 MG/3DAYS TD PT72
1.0000 | MEDICATED_PATCH | TRANSDERMAL | Status: DC
  Administered 2021-11-20: 1.5 mg via TRANSDERMAL

## 2021-11-20 MED ORDER — PROPOFOL 10 MG/ML IV BOLUS
INTRAVENOUS | Status: DC | PRN
  Administered 2021-11-20: 150 mg via INTRAVENOUS

## 2021-11-20 MED ORDER — DEXAMETHASONE SODIUM PHOSPHATE 10 MG/ML IJ SOLN
INTRAMUSCULAR | Status: AC
  Filled 2021-11-20: qty 1

## 2021-11-20 MED ORDER — ACETAMINOPHEN 325 MG PO TABS: 325.0000 mg | ORAL_TABLET | ORAL | Status: DC | PRN

## 2021-11-20 MED ORDER — FENTANYL CITRATE (PF) 100 MCG/2ML IJ SOLN
INTRAMUSCULAR | Status: DC | PRN
  Administered 2021-11-20: 50 ug via INTRAVENOUS

## 2021-11-20 MED ORDER — EPHEDRINE SULFATE-NACL 50-0.9 MG/10ML-% IV SOSY
PREFILLED_SYRINGE | INTRAVENOUS | Status: DC | PRN
  Administered 2021-11-20 (×2): 5 mg via INTRAVENOUS

## 2021-11-20 MED ORDER — AMISULPRIDE (ANTIEMETIC) 5 MG/2ML IV SOLN: 10.0000 mg | Freq: Once | INTRAVENOUS | Status: DC | PRN

## 2021-11-20 MED ORDER — PROMETHAZINE HCL 25 MG/ML IJ SOLN: 6.2500 mg | INTRAMUSCULAR | Status: DC | PRN

## 2021-11-20 MED ORDER — OXYCODONE HCL 5 MG PO TABS: 5.0000 mg | ORAL_TABLET | Freq: Once | ORAL | Status: DC | PRN

## 2021-11-20 MED ORDER — SCOPOLAMINE 1 MG/3DAYS TD PT72
MEDICATED_PATCH | TRANSDERMAL | Status: AC
  Filled 2021-11-20: qty 1

## 2021-11-20 MED ORDER — MIDAZOLAM HCL 5 MG/5ML IJ SOLN
INTRAMUSCULAR | Status: DC | PRN
  Administered 2021-11-20: 2 mg via INTRAVENOUS

## 2021-11-20 MED ORDER — DEXAMETHASONE SODIUM PHOSPHATE 4 MG/ML IJ SOLN
INTRAMUSCULAR | Status: DC | PRN
  Administered 2021-11-20: 6 mg via INTRAVENOUS

## 2021-11-20 MED ORDER — FENTANYL CITRATE (PF) 100 MCG/2ML IJ SOLN: 25.0000 ug | INTRAMUSCULAR | Status: DC | PRN

## 2021-11-20 MED ORDER — POVIDONE-IODINE 10 % EX SWAB: 2.0000 "application " | Freq: Once | CUTANEOUS | Status: DC

## 2021-11-20 MED ORDER — ACETAMINOPHEN 160 MG/5ML PO SOLN: 325.0000 mg | ORAL | Status: DC | PRN

## 2021-11-20 MED ORDER — LACTATED RINGERS IV SOLN: INTRAVENOUS | Status: DC

## 2021-11-20 MED ORDER — ACETAMINOPHEN 325 MG PO TABS: 650.0000 mg | ORAL_TABLET | Freq: Four times a day (QID) | ORAL | Status: DC | PRN

## 2021-11-20 MED ORDER — LIDOCAINE-EPINEPHRINE 1 %-1:100000 IJ SOLN
INTRAMUSCULAR | Status: DC | PRN
  Administered 2021-11-20: 20 mL

## 2021-11-20 MED ORDER — OXYCODONE HCL 5 MG PO TABS: 5.0000 mg | ORAL_TABLET | Freq: Four times a day (QID) | ORAL | 0 refills | Status: DC | PRN

## 2021-11-20 MED ORDER — ACETAMINOPHEN 500 MG PO TABS
ORAL_TABLET | ORAL | Status: AC
  Filled 2021-11-20: qty 2

## 2021-11-20 MED ORDER — HEMOSTATIC AGENTS (NO CHARGE) OPTIME
TOPICAL | Status: DC | PRN
  Administered 2021-11-20: 1 via TOPICAL

## 2021-11-20 MED ORDER — ACETAMINOPHEN 500 MG PO TABS
1000.0000 mg | ORAL_TABLET | ORAL | Status: AC
  Administered 2021-11-20: 1000 mg via ORAL

## 2021-11-20 MED ORDER — PHENYLEPHRINE 40 MCG/ML (10ML) SYRINGE FOR IV PUSH (FOR BLOOD PRESSURE SUPPORT)
PREFILLED_SYRINGE | INTRAVENOUS | Status: AC
  Filled 2021-11-20: qty 20

## 2021-11-20 MED ORDER — LIDOCAINE HCL (CARDIAC) PF 100 MG/5ML IV SOSY
PREFILLED_SYRINGE | INTRAVENOUS | Status: DC | PRN
  Administered 2021-11-20: 60 mg via INTRAVENOUS

## 2021-11-20 MED ORDER — MIDAZOLAM HCL 2 MG/2ML IJ SOLN
INTRAMUSCULAR | Status: AC
  Filled 2021-11-20: qty 2

## 2021-11-20 MED ORDER — PHENYLEPHRINE 40 MCG/ML (10ML) SYRINGE FOR IV PUSH (FOR BLOOD PRESSURE SUPPORT)
PREFILLED_SYRINGE | INTRAVENOUS | Status: DC | PRN
  Administered 2021-11-20 (×3): 80 ug via INTRAVENOUS

## 2021-11-20 MED ORDER — IBUPROFEN 600 MG PO TABS: 600.0000 mg | ORAL_TABLET | Freq: Four times a day (QID) | ORAL | 0 refills | Status: DC | PRN

## 2021-11-20 MED ORDER — LACTATED RINGERS IV SOLN
INTRAVENOUS | Status: DC
Start: 2021-11-20 — End: 2021-11-20

## 2021-11-20 MED ORDER — ONDANSETRON HCL 4 MG/2ML IJ SOLN
INTRAMUSCULAR | Status: AC
  Filled 2021-11-20: qty 2

## 2021-11-20 MED ORDER — FENTANYL CITRATE (PF) 100 MCG/2ML IJ SOLN
INTRAMUSCULAR | Status: AC
  Filled 2021-11-20: qty 2

## 2021-11-20 MED ORDER — ONDANSETRON HCL 4 MG/2ML IJ SOLN
INTRAMUSCULAR | Status: DC | PRN
  Administered 2021-11-20: 4 mg via INTRAVENOUS

## 2021-11-20 MED ORDER — LIDOCAINE HCL (PF) 2 % IJ SOLN
INTRAMUSCULAR | Status: AC
  Filled 2021-11-20: qty 5

## 2021-11-20 SURGICAL SUPPLY — 26 items
BLADE SURG 11 STRL SS (BLADE) ×3 IMPLANT
CATH ROBINSON RED A/P 16FR (CATHETERS) ×3 IMPLANT
DRSG TELFA 3X8 NADH (GAUZE/BANDAGES/DRESSINGS) ×2 IMPLANT
ELECT BALL LEEP 5MM RED (ELECTRODE) ×2 IMPLANT
ELECT REM PT RETURN 9FT ADLT (ELECTROSURGICAL) ×2
ELECTRODE REM PT RTRN 9FT ADLT (ELECTROSURGICAL) ×2 IMPLANT
GAUZE 4X4 16PLY ~~LOC~~+RFID DBL (SPONGE) ×9 IMPLANT
GLOVE SURG ENC TEXT LTX SZ6.5 (GLOVE) ×3 IMPLANT
GLOVE SURG UNDER POLY LF SZ6.5 (GLOVE) ×6 IMPLANT
GOWN STRL REUS W/TWL LRG LVL3 (GOWN DISPOSABLE) ×6 IMPLANT
HEMOSTAT SURGICEL 4X8 (HEMOSTASIS) ×1 IMPLANT
KIT TURNOVER CYSTO (KITS) ×3 IMPLANT
NS IRRIG 1000ML POUR BTL (IV SOLUTION) ×3 IMPLANT
PACK VAGINAL MINOR WOMEN LF (CUSTOM PROCEDURE TRAY) ×3 IMPLANT
PACK VAGINAL WOMENS (CUSTOM PROCEDURE TRAY) ×3 IMPLANT
PAD DRESSING TELFA 3X8 NADH (GAUZE/BANDAGES/DRESSINGS) ×2 IMPLANT
PAD OB MATERNITY 4.3X12.25 (PERSONAL CARE ITEMS) ×3 IMPLANT
PENCIL SMOKE EVACUATOR (MISCELLANEOUS) ×1 IMPLANT
SCOPETTES 8  STERILE (MISCELLANEOUS) ×2
SCOPETTES 8 STERILE (MISCELLANEOUS) ×2 IMPLANT
SLEEVE SCD COMPRESS KNEE MED (STOCKING) ×3 IMPLANT
SPONGE SURGIFOAM ABS GEL 12-7 (HEMOSTASIS) IMPLANT
SUT VIC AB 0 CT1 27 (SUTURE) ×8
SUT VIC AB 0 CT1 27XBRD ANBCTR (SUTURE) ×6 IMPLANT
TOWEL OR 17X26 10 PK STRL BLUE (TOWEL DISPOSABLE) ×6 IMPLANT
TUBE CONNECTING 12X1/4 (SUCTIONS) ×6 IMPLANT

## 2021-11-20 NOTE — H&P (Signed)
Reason for Appointment  1. Preop   History of Present Illness  General:  66 yo presents for a preop for cold knife conization. for the management of CIN III. The cold knife conization is scheduled for 11/20/2021.  History is significant for the following:  Pt had a negative pap smear which detected high-risk HPV on 08/21/2021.  Pt had a negative pap smear which detected high-risk HPV on 08/03/2020.  Pt had a negative pap smear which detected high-risk HPV on 08/03/2019. Following this pap smear she had a colposcopy on Dec 29th, 2020. The ECC was benign. Biopsy taken at the 12 o'clock position revealed CIN 1.  Pathology from colposcoy 10/04/2021 revealed CIN 2 and 3 on the ECC. The biopsy at the 12 oclock position was CIN I... patient stated that she would like to have a hysterectomy performed.. she is advised that I recommend a CKC to rule out malignancy and if no malignancy present will check insurance coverage of hysterectomy.Marland Kitchen if malignancy is present she will need referral to gyn oncology. she voiced understanding.   Current Medications  Taking   Anastrozole 1 MG Tablet 1 tablet Orally Once a day  MVI   Metoprolol Tartrate 50 MG Tablet 1 tablet with food Orally Twice a day  Simvastatin 10 MG Tablet 1 tablet in the evening Orally Once a day  Hydrochlorothiazide-25 mg 25 MG Tablet 1 tablet in the morning Orally Once a day  Vitamin D . Tablet 1 tablet Orally Once a day  Benadryl Allergy(diphenhydrAMINE HCl) . Tablet 1 tablet at bedtime as needed Orally as needed  Red Yeast Rice Extract 600 MG Capsule as directed Orally once a day  Medication List reviewed and reconciled with the patient   Past Medical History  Hypertension.   Hypercholesterolemia.   Breast CA- right Breast Lumpectomy.    Surgical History  lumpectomy - left breast 2008  lumpectomy, Right breast 2020  port-removal from breast cancer treatment 06/2020   Family History  Father: deceased  Mother: deceased  Sister 1:  diagnosed with Breast cancer  1 brother(s) , 3 sister(s) . 1 son(s) , 2 daughter(s) .    Social History  General:  Tobacco use cigarettes: Never smoked, Tobacco history last updated 11/13/2021. Alcohol: yes, Rare. no Caffeine. no Recreational drug use. Marital Status: married, [place]. Children: 2, daughter (s), 1, son. 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    Gyn History  Sexual activity not currently sexually active.  Periods : postmenopausal.  LMP ~2003, No PMB.  Last pap smear date 08/21/2021- neg/HPV pos..  Last mammogram date 04/25/2021 - benign.  H/O Abnormal pap smear assessed with colposcopy, CIN I; 10/04/2021, assessed with colposcopy- CIN 2-3.  H/O STD HPV.  GYN procedures Colposcopy.    OB History  Number of pregnancies 3.  Pregnancy # 1 live birth, vaginal delivery, girl.  Pregnancy # 2 live birth, vaginal delivery, girl.  Pregnancy # 3 live birth, vaginal delivery, boy.    Allergies  N.K.D.A.   Hospitalization/Major Diagnostic Procedure  No Hospitalization History.   Review of Systems  CONSTITUTIONAL:  Chills No. Fatigue No. Fever No. Night sweats No. Recent travel outside [place] No. Sweats No. Weight change No.  OPHTHALMOLOGY:  Blurring of vision no. Change in vision no. Double vision no.  ENT:  Dizziness no. Nose bleeds no. Sore throat no. Teeth pain no.  ALLERGY:  Hives no.  CARDIOLOGY:  Chest pain no. High blood pressure no. Irregular heart beat no.  Leg edema no. Palpitations no.  RESPIRATORY:  Shortness of breath no. Cough no. Wheezing no.  UROLOGY:  Pain with urination no. Urinary urgency no. Urinary frequency no. Urinary incontinence no. Difficulty urinating No. Blood in urine No.  GASTROENTEROLOGY:  Abdominal pain no. Appetite change no. Bloating/belching no. Blood in stool or on toilet paper no. Change in bowel movements no. Constipation no. Diarrhea no. Difficulty swallowing no. Nausea no.  FEMALE  REPRODUCTIVE:  Vulvar pain no. Vulvar rash no. Abnormal vaginal bleeding no. Breast pain no. Nipple discharge no. Pain with intercourse no. Pelvic pain no. Unusual vaginal discharge no. Vaginal itching no.  MUSCULOSKELETAL:  Muscle aches no.  NEUROLOGY:  Headache no. Tingling/numbness no. Weakness no.  PSYCHOLOGY:  Depression no. Anxiety no. Nervousness no. Sleep disturbances no. Suicidal ideation no .  ENDOCRINOLOGY:  Excessive thirst no. Excessive urination no. Hair loss no. Heat or cold intolerance no.  HEMATOLOGY/LYMPH:  Abnormal bleeding no. Easy bruising no. Swollen glands no.  DERMATOLOGY:  New/changing skin lesion no. Rash no. Sores no.    Vital Signs  Wt 160.0, Wt change 1.6 lbs, Ht 65.5, BMI 26.22, Pulse sitting 83, BP sitting 166/92.   Examination  General Examination: CONSTITUTIONAL: alert, oriented, NAD. SKIN: moist, warm. EYES: Conjunctiva clear. LUNGS: good I:E efffort noted , clear to auscultation bilaterally. HEART: heart sounds are normal, rhythm is regular, no murmur. ABDOMEN: soft, non-tender/non-distended, bowel sounds present. FEMALE GENITOURINARY: normal external genitalia, labia - unremarkable, vagina - pink moist mucosa, no lesions or abnormal discharge, cervix - no discharge or lesions or CMT, adnexa - no masses or tenderness, uterus - nontender and normal size on palpation. PSYCH: affect normal, good eye contact.     Physical Examination  Chaperone present:  Chaperone present Beather Arbour 11/13/2021 02:53:56 PM > , for pelvic exam.     Assessments     1. CIN III (cervical intraepithelial neoplasia grade III) with severe dysplasia - D06.9 (Primary)   Treatment  1. CIN III (cervical intraepithelial neoplasia grade III) with severe dysplasia  Notes: Planning cold knife conization procedure. R/B/A of CKC discussed with patient including but not limited to infection, bleeding, perforation of uterus. Discussed the post-operative restriction of not having sex  for 4 weeks after the procedure. Pt advised to not eat or drink after midnight the night before her procedure.

## 2021-11-20 NOTE — Anesthesia Postprocedure Evaluation (Signed)
Anesthesia Post Note  Patient: MERIEM LEMIEUX  Procedure(s) Performed: CONIZATION CERVIX WITH BIOPSY     Patient location during evaluation: PACU Anesthesia Type: General Level of consciousness: awake and alert Pain management: pain level controlled Vital Signs Assessment: post-procedure vital signs reviewed and stable Respiratory status: spontaneous breathing, nonlabored ventilation, respiratory function stable and patient connected to nasal cannula oxygen Cardiovascular status: blood pressure returned to baseline and stable Postop Assessment: no apparent nausea or vomiting Anesthetic complications: no   No notable events documented.  Last Vitals:  Vitals:   11/20/21 1330 11/20/21 1401  BP: (!) 160/81 (!) 150/86  Pulse:  74  Resp:  12  Temp: 36.5 C 36.9 C  SpO2:  100%    Last Pain:  Vitals:   11/20/21 1401  TempSrc:   PainSc: 0-No pain                 Effie [place]

## 2021-11-20 NOTE — Transfer of Care (Signed)
Immediate Anesthesia Transfer of Care Note  Patient: Emily Ross  Procedure(s) Performed: CONIZATION CERVIX WITH BIOPSY  Patient Location: PACU  Anesthesia Type:General  Level of Consciousness: awake  Airway & Oxygen Therapy: Patient Spontanous Breathing and Patient connected to nasal cannula oxygen  Post-op Assessment: Report given to RN and Post -op Vital signs reviewed and stable  Post vital signs: Reviewed and stable  Last Vitals:  Vitals Value Taken Time  BP 139/75 11/20/21 1255  Temp    Pulse 80 11/20/21 1258  Resp 11 11/20/21 1258  SpO2 100 % 11/20/21 1258  Vitals shown include unvalidated device data.  Last Pain:  Vitals:   11/20/21 1043  TempSrc: Oral  PainSc: 1       Patients Stated Pain Goal: 5 (83/50/75 [id number])  Complications: No notable events documented.

## 2021-11-20 NOTE — Discharge Instructions (Signed)

## 2021-11-20 NOTE — H&P (Signed)
Date of Initial H&P: 11/20/2021  History reviewed, patient examined, no change in status, stable for surgery.

## 2021-11-20 NOTE — Op Note (Signed)
11/20/2021  12:52 PM  PATIENT:  Emily Ross  66 y.o. female  PRE-OPERATIVE DIAGNOSIS:  Cervical Intraepithelial Neoplasia Grade Three  POST-OPERATIVE DIAGNOSIS:  Cervical Intraepithelial Neoplasia Grade Three  PROCEDURE:  Procedure(s): CONIZATION CERVIX WITH BIOPSY (N/A)  SURGEON:  Surgeon(s) and Role:    Christophe Louis, MD - Primary  PHYSICIAN ASSISTANT: None  ASSISTANTS: none   ANESTHESIA:   general  EBL:  10 cc   BLOOD ADMINISTERED:none  DRAINS: none   LOCAL MEDICATIONS USED:  LIDOCAINE   SPECIMEN:  Source of Specimen:  conization of the cervix   DISPOSITION OF SPECIMEN:  PATHOLOGY  COUNTS:  YES  TOURNIQUET:  * No tourniquets in log *  DICTATION: .Note written in EPIC  PLAN OF CARE: Discharge to home after PACU  PATIENT DISPOSITION:  PACU - hemodynamically stable.   Delay start of Pharmacological VTE agent (>24hrs) due to surgical blood loss or risk of bleeding: not applicable  Findings: atrophic vaginal mucosa and cervix. Cervix is almost flush with the vagina.   Procedure. Pt was taken to the operating room where she was placed under general anesthesia. Time out was performed. She was prepped and draped in a sterile fashion. Speculum was placed. The anterior lip of the cervix was grasped with single toothed tenaculum.  10 cc of 1% lidocaine with epi was placed at the 4 and 8 o'clock positions. A suture of 0 vicryl was placed at the 3 and 9 o'clock positions and used for retraction. The single toothed tenaculum was removed.  Scalpel was used to excise a cone portion of the cervix. The specimen was marked with a suture at the 12 o'clock position and sent to pathology. Hemostasis was obtained with roller ball .surgicel was placed in the wound bed. . Excellent hemostasis was noted.  Sponge lap and needle counts were correct x 2. Pt was awakened from anesthesia and taken to the recovery room in stable condition.

## 2021-11-20 NOTE — Anesthesia Procedure Notes (Signed)
Procedure Name: LMA Insertion Date/Time: 11/20/2021 12:17 PM Performed by: Lieutenant Diego, CRNA Pre-anesthesia Checklist: Patient identified, Emergency Drugs available, Suction available and Patient being monitored Patient Re-evaluated:Patient Re-evaluated prior to induction Oxygen Delivery Method: Circle system utilized Preoxygenation: Pre-oxygenation with 100% oxygen Induction Type: IV induction Ventilation: Mask ventilation without difficulty LMA: LMA inserted LMA Size: 4.0 Number of attempts: 1 Placement Confirmation: positive ETCO2 and breath sounds checked- equal and bilateral Tube secured with: Tape Dental Injury: Teeth and Oropharynx as per pre-operative assessment

## 2021-11-20 NOTE — Anesthesia Preprocedure Evaluation (Addendum)
Anesthesia Evaluation  Patient identified by MRN, date of birth, ID band Patient awake    Reviewed: Allergy & Precautions, NPO status , Patient's Chart, lab work & pertinent test results  Airway Mallampati: I  TM Distance: >3 FB Neck ROM: Full    Dental  (+) Partial Upper, Dental Advisory Given   Pulmonary    breath sounds clear to auscultation       Cardiovascular hypertension, Pt. on medications and Pt. on home beta blockers  Rhythm:Regular Rate:Normal     Neuro/Psych negative neurological ROS  negative psych ROS   GI/Hepatic Neg liver ROS, GERD  Medicated,  Endo/Other  negative endocrine ROS  Renal/GU negative Renal ROS     Musculoskeletal negative musculoskeletal ROS (+)   Abdominal   Peds  Hematology negative hematology ROS (+)   Anesthesia Other Findings   Reproductive/Obstetrics                            Anesthesia Physical Anesthesia Plan  ASA: 2  Anesthesia Plan: General   Post-op Pain Management:    Induction: Intravenous  PONV Risk Score and Plan: 4 or greater and Ondansetron, Dexamethasone, Midazolam and Scopolamine patch - Pre-op  Airway Management Planned: LMA  Additional Equipment: None  Intra-op Plan:   Post-operative Plan: Extubation in OR  Informed Consent: I have reviewed the patients History and Physical, chart, labs and discussed the procedure including the risks, benefits and alternatives for the proposed anesthesia with the patient or authorized representative who has indicated his/her understanding and acceptance.     Dental advisory given  Plan Discussed with: CRNA  Anesthesia Plan Comments:        Anesthesia Quick Evaluation

## 2021-11-21 ENCOUNTER — Encounter (HOSPITAL_BASED_OUTPATIENT_CLINIC_OR_DEPARTMENT_OTHER): Payer: Self-pay | Admitting: Obstetrics and Gynecology

## 2021-11-23 LAB — SURGICAL PATHOLOGY

## 2021-12-18 ENCOUNTER — Other Ambulatory Visit: Payer: Self-pay

## 2021-12-18 ENCOUNTER — Ambulatory Visit (INDEPENDENT_AMBULATORY_CARE_PROVIDER_SITE_OTHER): Payer: Medicare Other | Admitting: Nurse Practitioner

## 2021-12-18 ENCOUNTER — Encounter: Payer: Self-pay | Admitting: Nurse Practitioner

## 2021-12-18 VITALS — BP 136/88 | HR 75 | Temp 98.7°F | Ht 67.0 in | Wt 160.4 lb

## 2021-12-18 DIAGNOSIS — Z23 Encounter for immunization: Secondary | ICD-10-CM

## 2021-12-18 DIAGNOSIS — I10 Essential (primary) hypertension: Secondary | ICD-10-CM

## 2021-12-18 DIAGNOSIS — Z6825 Body mass index (BMI) 25.0-25.9, adult: Secondary | ICD-10-CM

## 2021-12-18 DIAGNOSIS — E7800 Pure hypercholesterolemia, unspecified: Secondary | ICD-10-CM | POA: Diagnosis not present

## 2021-12-18 DIAGNOSIS — E663 Overweight: Secondary | ICD-10-CM

## 2021-12-18 NOTE — Progress Notes (Signed)
?I,[name],acting as a scribe for [name], FNP.,have documented all relevant documentation on the behalf of [name], FNP,as directed by  [name], FNP while in the presence of [name], FNP.  ? ?This visit occurred during the SARS-CoV-2 public health emergency.  Safety protocols were in place, including screening questions prior to the visit, additional usage of staff PPE, and extensive cleaning of exam room while observing appropriate contact time as indicated for disinfecting solutions. ? ?Subjective:  ?  ? Patient ID: Emily Ross , female    DOB: 11/16/1955 , 66 y.o.   MRN: [id number] ? ? ?Chief Complaint  ?Patient presents with  ? Hypertension  ? Hyperlipidemia  ? ? ?HPI ? ?Patient presents today for a blood pressure and cholesterol f/u.  She had some dysplasia on her uterus, biopsy was done. She has a f/u with Dr Cole will be discussing next options - possible hysterectomy.  She has recently eaten an increased amount of seafood this weekend.  Exercises with walking daily.  ? ?Hypertension ?This is a chronic problem. The current episode started more than 1 year ago. The problem is unchanged. The problem is controlled. Pertinent negatives include no anxiety, chest pain, headaches, palpitations or shortness of breath. There are no associated agents to hypertension. Risk factors for coronary artery disease include sedentary lifestyle and post-menopausal state (she is on chemo medication). Past treatments include angiotensin blockers. Compliance problems include exercise.  There is no history of angina. There is no history of chronic renal disease.  ?Hyperlipidemia ?This is a chronic problem. The current episode started more than 1 year ago. The problem is controlled. Recent lipid tests were reviewed and are normal (LDL slightly elevated). She has no history of chronic renal disease. Pertinent negatives include no chest pain or shortness of breath. Current antihyperlipidemic treatment  includes exercise. The current treatment provides mild improvement of lipids. There are no compliance problems.    ? ?Past Medical History:  ?Diagnosis Date  ? Breast cancer (HCC)   ? Cancer (HCC)   ? 2009 left breast 2020 right breast  ? Family history of breast cancer   ? Family history of stomach cancer   ? High cholesterol   ? History of radiation therapy 08/23/19- 09/21/19  ? Right breast 15 fx of 2.67 Gy to total 40.05 Gy. Right Breast boost 5 fx of 2 Gy each to total 10 Gy  ? History of seasonal allergies   ? Hypertension   ? Pneumonia   ? Pre-diabetes   ? Wears glasses   ?  ? ?Family History  ?Problem Relation Age of Onset  ? Diabetes Father   ? Hypertension Father   ? Stomach cancer Father 58  ? Diabetes Mother   ? Hypertension Mother   ? Breast cancer Sister 54  ?     negative genetic testing  ? Breast cancer Maternal Aunt 80  ? Colon cancer Neg Hx   ? Esophageal cancer Neg Hx   ? Rectal cancer Neg Hx   ? ? ? ?Current Outpatient Medications:  ?  [START ON 01/17/2022] anastrozole (ARIMIDEX) 1 MG tablet, Take 1 tablet (1 mg total) by mouth daily., Disp: 90 tablet, Rfl: 3 ?  carboxymethylcellul-glycerin (LUBRICATING EYE DROPS) 0.5-0.9 % ophthalmic solution, Place 1 drop into both eyes as needed (dry eyes)., Disp: 30 mL, Rfl: 1 ?  Cholecalciferol (DIALYVITE VITAMIN D 5000) 125 MCG (5000 UT) capsule, Take 5,000 Units by mouth daily., Disp: , Rfl:  ?    hydrochlorothiazide (HYDRODIURIL) 25 MG tablet, TAKE ONE TABLET BY MOUTH ONE TIME DAILY, Disp: 90 tablet, Rfl: 0 ?  ibuprofen (ADVIL) 600 MG tablet, Take 1 tablet (600 mg total) by mouth every 6 (six) hours as needed., Disp: 30 tablet, Rfl: 0 ?  metoprolol tartrate (LOPRESSOR) 50 MG tablet, TAKE ONE TABLET BY MOUTH TWICE DAILY, Disp: 180 tablet, Rfl: 0 ?  Red Yeast Rice Extract (RED YEAST RICE PO), Take by mouth., Disp: , Rfl:  ?  simvastatin (ZOCOR) 10 MG tablet, TAKE ONE TABLET BY MOUTH DAILY IN THE EVENING, Disp: 90 tablet, Rfl: 0 ?  diphenhydrAMINE (BENADRYL) 25  MG tablet, Take 25 mg by mouth daily as needed for allergies. (Patient not taking: Reported on 12/18/2021), Disp: , Rfl:  ?  famotidine (PEPCID) 10 MG tablet, Take 10 mg by mouth 2 (two) times daily. (Patient not taking: Reported on 12/18/2021), Disp: , Rfl:  ?  oxyCODONE (OXY IR/ROXICODONE) 5 MG immediate release tablet, Take 1 tablet (5 mg total) by mouth every 6 (six) hours as needed for severe pain. (Patient not taking: Reported on 12/18/2021), Disp: 8 tablet, Rfl: 0  ? ?Allergies  ?Allergen Reactions  ? Tomato   ?  ? ?Review of Systems  ?Constitutional: Negative.   ?Respiratory: Negative.  Negative for shortness of breath.   ?Cardiovascular: Negative.  Negative for chest pain, palpitations and leg swelling.  ?Neurological: Negative.  Negative for headaches.  ?Psychiatric/Behavioral: Negative.     ? ?Today's Vitals  ? 12/18/21 0917  ?BP: 136/88  ?Pulse: 75  ?Temp: 98.7 ?F (37.1 ?C)  ?Weight: 160 lb 6.4 oz (72.8 kg)  ?Height: 5' 7" (1.702 m)  ? ?Body mass index is 25.12 kg/m?.  ?Wt Readings from Last 3 Encounters:  ?12/18/21 160 lb 6.4 oz (72.8 kg)  ?11/20/21 156 lb (70.8 kg)  ?10/26/21 158 lb 6.4 oz (71.8 kg)  ?  ?BP Readings from Last 3 Encounters:  ?12/18/21 136/88  ?11/20/21 (!) 150/86  ?10/26/21 (!) 160/81  ?  ?Objective:  ?Physical Exam ?Vitals reviewed.  ?Constitutional:   ?   General: She is not in acute distress. ?   Appearance: Normal appearance.  ?Cardiovascular:  ?   Rate and Rhythm: Normal rate and regular rhythm.  ?   Pulses: Normal pulses.  ?   Heart sounds: Normal heart sounds. No murmur heard. ?Pulmonary:  ?   Effort: Pulmonary effort is normal. No respiratory distress.  ?   Breath sounds: Normal breath sounds. No wheezing.  ?Neurological:  ?   General: No focal deficit present.  ?   Mental Status: She is alert and oriented to person, place, and time.  ?   Cranial Nerves: No cranial nerve deficit.  ?   Motor: No weakness.  ?Psychiatric:     ?   Mood and Affect: Mood normal.     ?   Behavior:  Behavior normal.     ?   Thought Content: Thought content normal.     ?   Judgment: Judgment normal.  ?  ? ?   ?Assessment And Plan:  ?   ?1. Essential hypertension ?Comments: Blood pressure is slightly elevated today, she has eaten an increased amount of seafood.  ?- BMP8+EGFR ? ?2. Elevated LDL cholesterol level ?Comments: Stable, continue statin, tolerating well  ?- Lipid panel ? ?3. Overweight with body mass index (BMI) of 25 to 25.9 in adult ?She is encouraged to strive for BMI less than 25 to decrease cardiac risk. Advised to aim   for at least 150 minutes of exercise per week.  ? ?4. Need for vaccination ?Comments: Prevnar 20 given in office.  ?- Pneumococcal conjugate vaccine 20-valent (Prevnar 20) ? ? ? ?Patient was given opportunity to ask questions. Patient verbalized understanding of the plan and was able to repeat key elements of the plan. All questions were answered to their satisfaction.  ?[name], FNP  ? ?I, [name], FNP, have reviewed all documentation for this visit. The documentation on 12/18/21 for the exam, diagnosis, procedures, and orders are all accurate and complete.  ? ?IF YOU HAVE BEEN REFERRED TO A SPECIALIST, IT MAY TAKE 1-2 WEEKS TO SCHEDULE/PROCESS THE REFERRAL. IF YOU HAVE NOT HEARD FROM US/SPECIALIST IN TWO WEEKS, PLEASE GIVE US A CALL AT [phone].  ? ?THE PATIENT IS ENCOURAGED TO PRACTICE SOCIAL DISTANCING DUE TO THE COVID-19 PANDEMIC.   ?

## 2021-12-18 NOTE — Patient Instructions (Signed)
Mediterranean Diet °A Mediterranean diet refers to food and lifestyle choices that are based on the traditions of countries located on the [place]. It focuses on eating more fruits, vegetables, whole grains, beans, nuts, seeds, and heart-healthy fats, and eating less dairy, meat, eggs, and processed foods with added sugar, salt, and fat. This way of eating has been shown to help prevent certain conditions and improve outcomes for people who have chronic diseases, like kidney disease and heart disease. °What are tips for following this plan? °Reading food labels °Check the serving size of packaged foods. For foods such as rice and pasta, the serving size refers to the amount of cooked product, not dry. °Check the total fat in packaged foods. Avoid foods that have saturated fat or trans fats. °Check the ingredient list for added sugars, such as corn syrup. °Shopping ° °Buy a variety of foods that offer a balanced diet, including: °Fresh fruits and vegetables (produce). °Grains, beans, nuts, and seeds. Some of these may be available in unpackaged forms or large amounts (in bulk). °Fresh seafood. °Poultry and eggs. °Low-fat dairy products. °Buy whole ingredients instead of prepackaged foods. °Buy fresh fruits and vegetables in-season from local farmers markets. °Buy plain frozen fruits and vegetables. °If you do not have access to quality fresh seafood, buy precooked frozen shrimp or canned fish, such as tuna, salmon, or sardines. °Stock your pantry so you always have certain foods on hand, such as olive oil, canned tuna, canned tomatoes, rice, pasta, and beans. °Cooking °Cook foods with extra-virgin olive oil instead of using butter or other vegetable oils. °Have meat as a side dish, and have vegetables or grains as your main dish. This means having meat in small portions or adding small amounts of meat to foods like pasta or stew. °Use beans or vegetables instead of meat in common dishes like chili or  lasagna. °Experiment with different cooking methods. Try roasting, broiling, steaming, and sautéing vegetables. °Add frozen vegetables to soups, stews, pasta, or rice. °Add nuts or seeds for added healthy fats and plant protein at each meal. You can add these to yogurt, salads, or vegetable dishes. °Marinate fish or vegetables using olive oil, lemon juice, garlic, and fresh herbs. °Meal planning °Plan to eat one vegetarian meal one day each week. Try to work up to two vegetarian meals, if possible. °Eat seafood two or more times a week. °Have healthy snacks readily available, such as: °Vegetable sticks with hummus. °Greek yogurt. °Fruit and nut trail mix. °Eat balanced meals throughout the week. This includes: °Fruit: 2-3 servings a day. °Vegetables: 4-5 servings a day. °Low-fat dairy: 2 servings a day. °Fish, poultry, or lean meat: 1 serving a day. °Beans and legumes: 2 or more servings a week. °Nuts and seeds: 1-2 servings a day. °Whole grains: 6-8 servings a day. °Extra-virgin olive oil: 3-4 servings a day. °Limit red meat and sweets to only a few servings a month. °Lifestyle ° °Cook and eat meals together with your family, when possible. °Drink enough fluid to keep your urine pale yellow. °Be physically active every day. This includes: °Aerobic exercise like running or swimming. °Leisure activities like gardening, walking, or housework. °Get 7-8 hours of sleep each night. °If recommended by your health care provider, drink red wine in moderation. This means 1 glass a day for nonpregnant women and 2 glasses a day for men. A glass of wine equals 5 oz (150 mL). °What foods should I eat? °Fruits °Apples. Apricots. Avocado. Berries. Bananas. Cherries. Dates.   Figs. Grapes. Lemons. Melon. Oranges. Peaches. Plums. Pomegranate. °Vegetables °Artichokes. Beets. Broccoli. Cabbage. Carrots. Eggplant. Green beans. Chard. Kale. Spinach. Onions. Leeks. Peas. Squash. Tomatoes. Peppers. Radishes. °Grains °Whole-grain pasta. Brown  rice. Bulgur wheat. Polenta. Couscous. Whole-wheat bread. Oatmeal. Quinoa. °Meats and other proteins °Beans. Almonds. Sunflower seeds. Pine nuts. Peanuts. Cod. Salmon. Scallops. Shrimp. Tuna. Tilapia. Clams. Oysters. Eggs. Poultry without skin. °Dairy °Low-fat milk. Cheese. Greek yogurt. °Fats and oils °Extra-virgin olive oil. Avocado oil. Grapeseed oil. °Beverages °Water. Red wine. Herbal tea. °Sweets and desserts °Greek yogurt with honey. Baked apples. Poached pears. Trail mix. °Seasonings and condiments °Basil. Cilantro. Coriander. Cumin. Mint. Parsley. Sage. Rosemary. Tarragon. Garlic. Oregano. Thyme. Pepper. Balsamic vinegar. Tahini. Hummus. Tomato sauce. Olives. Mushrooms. °The items listed above may not be a complete list of foods and beverages you can eat. Contact a dietitian for more information. °What foods should I limit? °This is a list of foods that should be eaten rarely or only on special occasions. °Fruits °Fruit canned in syrup. °Vegetables °Deep-fried potatoes (french fries). °Grains °Prepackaged pasta or rice dishes. Prepackaged cereal with added sugar. Prepackaged snacks with added sugar. °Meats and other proteins °Beef. Pork. Lamb. Poultry with skin. Hot dogs. Bacon. °Dairy °Ice cream. Sour cream. Whole milk. °Fats and oils °Butter. Canola oil. Vegetable oil. Beef fat (tallow). Lard. °Beverages °Juice. Sugar-sweetened soft drinks. Beer. Liquor and spirits. °Sweets and desserts °Cookies. Cakes. Pies. Candy. °Seasonings and condiments °Mayonnaise. Pre-made sauces and marinades. °The items listed above may not be a complete list of foods and beverages you should limit. Contact a dietitian for more information. °Summary °The Mediterranean diet includes both food and lifestyle choices. °Eat a variety of fresh fruits and vegetables, beans, nuts, seeds, and whole grains. °Limit the amount of red meat and sweets that you eat. °If recommended by your health care provider, drink red wine in moderation.  This means 1 glass a day for nonpregnant women and 2 glasses a day for men. A glass of wine equals 5 oz (150 mL). °This information is not intended to replace advice given to you by your health care provider. Make sure you discuss any questions you have with your health care provider. °Document Revised: 10/23/2019 Document Reviewed: 08/20/2019 °Elsevier Patient Education © 2022 [employer] ° °

## 2021-12-19 LAB — BMP8+EGFR
BUN/Creatinine Ratio: 16 (ref 12–28)
BUN: 13 mg/dL (ref 8–27)
CO2: 23 mmol/L (ref 20–29)
Calcium: 10.3 mg/dL (ref 8.7–10.3)
Chloride: 105 mmol/L (ref 96–106)
Creatinine, Ser: 0.8 mg/dL (ref 0.57–1.00)
Glucose: 110 mg/dL — ABNORMAL HIGH (ref 70–99)
Potassium: 4.1 mmol/L (ref 3.5–5.2)
Sodium: 144 mmol/L (ref 134–144)
eGFR: 81 mL/min/{1.73_m2} (ref >59)

## 2021-12-19 LAB — LIPID PANEL
Chol/HDL Ratio: 2.5 ratio (ref 0.0–4.4)
Cholesterol, Total: 182 mg/dL (ref 100–199)
HDL: 72 mg/dL (ref >39)
LDL Chol Calc (NIH): 98 mg/dL (ref 0–99)
Triglycerides: 65 mg/dL (ref 0–149)
VLDL Cholesterol Cal: 12 mg/dL (ref 5–40)

## 2022-01-16 ENCOUNTER — Other Ambulatory Visit: Payer: Self-pay | Admitting: Nurse Practitioner

## 2022-01-16 DIAGNOSIS — I10 Essential (primary) hypertension: Secondary | ICD-10-CM

## 2022-01-31 ENCOUNTER — Other Ambulatory Visit: Payer: Self-pay | Admitting: Nurse Practitioner

## 2022-01-31 DIAGNOSIS — E7800 Pure hypercholesterolemia, unspecified: Secondary | ICD-10-CM

## 2022-01-31 DIAGNOSIS — I10 Essential (primary) hypertension: Secondary | ICD-10-CM

## 2022-02-02 ENCOUNTER — Other Ambulatory Visit (HOSPITAL_COMMUNITY): Payer: Medicare Other

## 2022-02-06 ENCOUNTER — Encounter (HOSPITAL_BASED_OUTPATIENT_CLINIC_OR_DEPARTMENT_OTHER): Payer: Self-pay | Admitting: Obstetrics and Gynecology

## 2022-02-07 ENCOUNTER — Other Ambulatory Visit: Payer: Self-pay

## 2022-02-07 ENCOUNTER — Encounter (HOSPITAL_BASED_OUTPATIENT_CLINIC_OR_DEPARTMENT_OTHER): Payer: Self-pay | Admitting: Obstetrics and Gynecology

## 2022-02-07 NOTE — Progress Notes (Addendum)
Spoke w/ via phone for pre-op interview---Emily Ross ?Lab needs dos----none per anesthesia, surgeon orders pending as of 02/07/22            ?Lab results------02/09/22 lab appt for CBC, BMP, type & screen, 05/11/21 EKG in chart & Epic ?COVID test -----patient states asymptomatic no test needed ?Arrive at -------1200 on 02/13/22 ?NPO after MN NO Solid Food.  Clear liquids from MN until---1100 ?Med rec completed ?Medications to take morning of surgery -----Arimidex, Metoprolol, Famotidine prn ?Diabetic medication -----n/a ?Patient instructed no nail polish to be worn day of surgery ?Patient instructed to bring photo id and insurance card day of surgery ?Patient aware to have Driver (ride ) / caregiver    for 24 hours after surgery - husband, Emily Ross ?Patient Special Instructions -----Extended / overnight instructions given. Made patient aware that partial dentures will need to be removed before surgery. ?Pre-Op special Istructions -----Requested orders from Dr. Landry Mellow via Epic IB on 02/06/22 ?Patient verbalized understanding of instructions that were given at this phone interview. ?Patient denies shortness of breath, chest pain, fever, cough at this phone interview.  ? ?Patient had a conization cervix with biopsy at [facility] on 11/20/21. Patient states that there have been no changes to her medical history since that time. ?

## 2022-02-07 NOTE — Progress Notes (Signed)
? ? Your procedure is scheduled on Tuesday, Feb 13, 2022. ? Report to [place] noon. ? ? Call this number if you have problems the morning of surgery  :[phone]. ? ? [place].  WE ARE LOCATED IN THE [facility]. ? ?PLEASE BRING YOUR INSURANCE CARD AND PHOTO ID DAY OF SURGERY. ? ?ONLY 2 PEOPLE ARE ALLOWED IN  WAITING  ROOM.  ?                                   ? REMEMBER: ? DO NOT EAT FOOD, CANDY GUM OR MINTS  AFTER MIDNIGHT THE NIGHT BEFORE YOUR SURGERY . YOU MAY HAVE CLEAR LIQUIDS FROM MIDNIGHT THE NIGHT BEFORE YOUR SURGERY UNTIL  11:00 am. NO CLEAR LIQUIDS AFTER   11:00 am DAY OF SURGERY. ? ?YOU MAY  BRUSH YOUR TEETH MORNING OF SURGERY AND RINSE YOUR MOUTH OUT, NO CHEWING GUM CANDY OR MINTS. ? ? ? ? ?CLEAR LIQUID DIET ? ? ?Foods Allowed                                                                     Foods Excluded ? ?Coffee and tea, regular and decaf                             liquids that you cannot  ?Plain Jell-O                                                                   see through such as: ?Fruit ices (not with fruit pulp)                                     milk, soups, orange juice  ?Plain  Popsicles                                    All solid food ?Carbonated beverages, regular and diet                                    ?Cranberry, grape and apple juices ?Sports drinks like Gatorade ?_____________________________________________________________________ ?  ? ? TAKE THESE MEDICATIONS MORNING OF SURGERY: Arimidex, Metoprolol, Famotidine if needed ? ? ? UP TO 4 VISITORS  MAY VISIT IN THE EXTENDED RECOVERY ROOM UNTIL 800 PM ONLY.  1 VISITOR AGE 67 AND OVER MAY SPEND THE NIGHT AND MUST BE IN EXTENDED RECOVERY ROOM NO LATER THAN 800 PM . YOUR DISCHARGE TIME AFTER YOU SPEND THE NIGHT IS 900 AM THE MORNING AFTER YOUR SURGERY. ? ?YOU MAY PACK A SMALL OVERNIGHT BAG WITH TOILETRIES FOR  YOUR OVERNIGHT STAY IF YOU WISH. ? ?YOUR  PRESCRIPTION MEDICATIONS WILL BE PROVIDED DURING [place]. ? ? ?                                   ?DO NOT WEAR JEWERLY, MAKE UP. ?DO NOT WEAR LOTIONS, POWDERS, PERFUMES OR NAIL POLISH ON YOUR FINGERNAILS. TOENAIL POLISH IS OK TO WEAR. ?DO NOT SHAVE FOR 48 HOURS PRIOR TO DAY OF SURGERY. ?MEN MAY SHAVE FACE AND NECK. ?CONTACTS, GLASSES, OR DENTURES MAY NOT BE WORN TO SURGERY. ? ?REMEMBER: NO SMOKING, DRUGS OR ALCOHOL FOR 24 HOURS BEFORE YOUR SURGERY. ?                                   ?[facility] IS NOT RESPONSIBLE  FOR ANY BELONGINGS.                                  ?                                  . ?          [facility] - Preparing for Surgery ?Before surgery, you can play an important role.  Because skin is not sterile, your skin needs to be as free of germs as possible.  You can reduce the number of germs on your skin by washing with CHG (chlorahexidine gluconate) soap before surgery.  CHG is an antiseptic cleaner which kills germs and bonds with the skin to continue killing germs even after washing. ?Please DO NOT use if you have an allergy to CHG or antibacterial soaps.  If your skin becomes reddened/irritated stop using the CHG and inform your nurse when you arrive at Short Stay. ?Do not shave (including legs and underarms) for at least 48 hours prior to the first CHG shower.  You may shave your face/neck. ?Please follow these instructions carefully: ? 1.  Shower with CHG Soap the night before surgery and the  morning of Surgery. ? 2.  If you choose to wash your hair, wash your hair first as usual with your  normal  shampoo. ? 3.  After you shampoo, rinse your hair and body thoroughly to remove the  shampoo.                            ?4.  Use CHG as you would any other liquid soap.  You can apply chg directly  to the skin and wash , please wash your belly button thoroughly with chg soap provided night before and morning of your surgery. ?                    Gently with a scrungie or clean  washcloth. ? 5.  Apply the CHG Soap to your body ONLY FROM THE NECK DOWN.   Do not use on face/ open      ?                     Wound or open sores. Avoid contact with eyes, ears mouth and genitals (private parts).  ?  Wash face,  Genitals (private parts) with your normal soap. ?            6.  Wash thoroughly, paying special attention to the area where your surgery  will be performed. ? 7.  Thoroughly rinse your body with warm water from the neck down. ? 8.  DO NOT shower/wash with your normal soap after using and rinsing off  the CHG Soap. ?               9.  Pat yourself dry with a clean towel. ?           10.  Wear clean pajamas. ?           11.  Place clean sheets on your bed the night of your first shower and do not  sleep with pets. ?Day of Surgery : ?Do not apply any lotions/deodorants the morning of surgery.  Please wear clean clothes to the hospital/surgery center. ? ?IF YOU HAVE ANY SKIN IRRITATION OR PROBLEMS WITH THE SURGICAL SOAP, PLEASE GET A BAR OF GOLD DIAL SOAP AND SHOWER THE NIGHT BEFORE YOUR SURGERY AND THE MORNING OF YOUR SURGERY. PLEASE LET THE NURSE KNOW MORNING OF YOUR SURGERY IF YOU HAD ANY PROBLEMS WITH THE SURGICAL SOAP. ? ? ?________________________________________________________________________                  ?                                    ?  QUESTIONS CALL Shiane [facility] PRE OP NURSE PHONE [phone].                                    ?

## 2022-02-09 ENCOUNTER — Encounter (HOSPITAL_COMMUNITY)
Admission: RE | Admit: 2022-02-09 | Discharge: 2022-02-09 | Disposition: A | Discharge status: Home / Self Care | Payer: Medicare Other | Source: Ambulatory Visit | Attending: Obstetrics and Gynecology | Admitting: Obstetrics and Gynecology

## 2022-02-09 DIAGNOSIS — Z01812 Encounter for preprocedural laboratory examination: Secondary | ICD-10-CM | POA: Diagnosis present

## 2022-02-09 DIAGNOSIS — Z01818 Encounter for other preprocedural examination: Secondary | ICD-10-CM

## 2022-02-09 LAB — BASIC METABOLIC PANEL
Anion gap: 7 (ref 5–15)
BUN: 17 mg/dL (ref 8–23)
CO2: 29 mmol/L (ref 22–32)
Calcium: 9.7 mg/dL (ref 8.9–10.3)
Chloride: 101 mmol/L (ref 98–111)
Creatinine, Ser: 0.81 mg/dL (ref 0.44–1.00)
GFR, Estimated: >60 mL/min (ref >60)
Glucose, Bld: 90 mg/dL (ref 70–99)
Potassium: 3.9 mmol/L (ref 3.5–5.1)
Sodium: 137 mmol/L (ref 135–145)

## 2022-02-09 LAB — CBC
HCT: 35.1 % — ABNORMAL LOW (ref 36.0–46.0)
Hemoglobin: 11.7 g/dL — ABNORMAL LOW (ref 12.0–15.0)
MCH: 31.3 pg (ref 26.0–34.0)
MCHC: 33.3 g/dL (ref 30.0–36.0)
MCV: 93.9 fL (ref 80.0–100.0)
Platelets: 270 10*3/uL (ref 150–400)
RBC: 3.74 MIL/uL — ABNORMAL LOW (ref 3.87–5.11)
RDW: 12.9 % (ref 11.5–15.5)
WBC: 3.4 10*3/uL — ABNORMAL LOW (ref 4.0–10.5)
nRBC: 0 % (ref 0.0–0.2)

## 2022-02-12 ENCOUNTER — Other Ambulatory Visit: Payer: Self-pay | Admitting: Obstetrics and Gynecology

## 2022-02-12 DIAGNOSIS — D069 Carcinoma in situ of cervix, unspecified: Secondary | ICD-10-CM

## 2022-02-12 NOTE — H&P (Deleted)
  The note originally documented on this encounter has been moved the the encounter in which it belongs.  

## 2022-02-12 NOTE — H&P (Signed)
Reason for Appointment   1. Preop       History of Present Illness  General:          66 y/o presents for preop visit. Pt is scedule for a robotic assisted laparoscopic hysterectomy with bilateral salpingo- oophorectomy possible abdominal hysterectomy with BSO on Feb 13, 2022 at 2pm for management of CIN III and fibroids.      Current Medications  Taking  Anastrozole 1 MG Tablet 1 tablet Orally Once a day    MVI     Metoprolol Tartrate 50 MG Tablet 1 tablet with food Orally Twice a day    Simvastatin 10 MG Tablet 1 tablet in the evening Orally Once a day    Hydrochlorothiazide-25 mg 25 MG Tablet 1 tablet in the morning Orally Once a day    Vitamin D . Tablet 1 tablet Orally Once a day    Benadryl Allergy(diphenhydrAMINE HCl) . Tablet 1 tablet at bedtime as needed Orally as needed    Red Yeast Rice Extract 600 MG Capsule as directed Orally once a day    Medication List reviewed and reconciled with the patient         Past Medical History        Hypertension.         Hypercholesterolemia.         Breast CA- right Breast Lumpectomy.        Surgical History         lumpectomy - left breast 2008         lumpectomy, Right breast 2020         port-removal from breast cancer treatment 06/2020       Family History   Father: deceased   Mother: deceased   Sister 1: diagnosed with Breast cancer   1 brother(s) , 3 sister(s) . 1 son(s) , 2 daughter(s) .        Social History  General:   Tobacco use  cigarettes: Never smoked, Tobacco history last updated 01/30/2022. Alcohol: yes, Rare. Caffeine: yes, 2 servings daily, coffee. no Recreational drug use. Marital Status: married, [place]. Children: 2, daughter (s), 1, son. 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      Gyn History  Sexual activity not currently sexually active.   Periods : postmenopausal.   LMP ~2003, No PMB.   Last pap smear date  08/21/2021- neg/HPV pos..   Last mammogram date 04/25/2021 - benign.   H/O Abnormal pap smear assessed with colposcopy, CIN I; 10/04/2021, assessed with colposcopy- CIN 2-3.   H/O STD HPV.   GYN procedures Colposcopy, [facility].       OB History  Number of pregnancies 3.   Pregnancy # 1 live birth, vaginal delivery, girl.   Pregnancy # 2 live birth, vaginal delivery, girl.   Pregnancy # 3 live birth, vaginal delivery, boy.       Allergies   N.K.D.A.       Hospitalization/Major Diagnostic Procedure   Denies Past Hospitalization       Review of Systems  CONSTITUTIONAL:         Chills No. Fatigue No. Fever No. Night sweats No. Recent travel outside [place] No. Sweats No. Weight change No.     OPHTHALMOLOGY:         Blurring of vision no. Change in vision no. Double vision no.     ENT:         Dizziness no. Nose bleeds no. Sore  throat no. Teeth pain no.     ALLERGY:         Hives no.     CARDIOLOGY:         Chest pain no. High blood pressure no. Irregular heart beat no. Leg edema no. Palpitations no.     RESPIRATORY:         Shortness of breath no. Cough no. Wheezing no.     UROLOGY:         Pain with urination no. Urinary urgency no. Urinary frequency no. Urinary incontinence no. Difficulty urinating No. Blood in urine No.     GASTROENTEROLOGY:         Abdominal pain no. Appetite change no. Bloating/belching no. Blood in stool or on toilet paper no. Change in bowel movements no. Constipation no. Diarrhea no. Difficulty swallowing no. Nausea no.     FEMALE REPRODUCTIVE:         Vulvar pain no. Vulvar rash no. Abnormal vaginal bleeding no. Breast pain no. Nipple discharge no. Pain with intercourse no. Pelvic pain no. Unusual vaginal discharge no. Vaginal itching no.     MUSCULOSKELETAL:         Muscle aches no.     NEUROLOGY:         Headache no. Tingling/numbness no. Weakness no.     PSYCHOLOGY:         Depression no. Anxiety no. Nervousness no. Sleep disturbances no. Suicidal  ideation no .     ENDOCRINOLOGY:         Excessive thirst no. Excessive urination no. Hair loss no. Heat or cold intolerance no.     HEMATOLOGY/LYMPH:         Abnormal bleeding no. Easy bruising no. Swollen glands no.     DERMATOLOGY:         New/changing skin lesion no. Rash no. Sores no.        Vital Signs  Wt 159.8, Wt change 0.2 lbs, Ht 65.5, BMI 26.18, Pulse sitting 59, BP sitting 133/80.     Examination  General Examination:       CONSTITUTIONAL: alert, oriented, NAD . SKIN:  moist, warm. EYES:  Conjunctiva clear. LUNGS:  good I:E efffort noted, clear to auscultation bilaterally. HEART:  regular rate and rhythm. ABDOMEN: soft, non-tender/non-distended, bowel sounds present . FEMALE GENITOURINARY: normal external genitalia, labia - unremarkable, vagina - pink moist mucosa, no lesions or abnormal discharge, cervix - no discharge or lesions or CMT the cervix is flush with the vaginal mucosa... , adnexa - no masses or tenderness, uterus - nontender and normal size on palpation . EXTREMITIES:  no edema present. PSYCH:  affect normal, good eye contact.       Physical Examination  Chaperone present:         Chaperone present  Beather Arbour 01/30/2022 12:03:01 PM > , for pelvic exam.            Assessments     1. CIN III (cervical intraepithelial neoplasia grade III) with severe dysplasia - D06.9 (Primary)   2. Fibroids - D25.9     Treatment   1. CIN III (cervical intraepithelial neoplasia grade III) with severe dysplasia   Notes: patient desires to decrease the risk of cervical cancer by undergoing hysterectomy. we are planning for robotic assisted laparoscopi hysterectomy wtih bilateral salpingoophorectomy. possible TAH/BSO. she is advised if I am unable to place a manipulator around the cervix for robotic approach we will have to pursue the abdominal approach with  a bikini line incision ( Pfannensteihl) ... r/b/a of surgery were discussed with the patient including but not  limited to infection , bleeding, damage to bowel bladder ureters with the need for further surgery. R/o Transfusion HIV/ Hep B&C discussed. Pt voiced understanding and desires to proceed.      2. Fibroids   Notes: . patient desires to decrease the risk of cervical cancer by undergoing hysterectomy. we are planning for robotic assisted laparoscopi hysterectomy wtih bilateral salpingoophorectomy. possible TAH/BSO. she is advised if I am unable to place a manipulator around the cervix for robotic approach we will have to pursue the abdominal approach with a bikini line incision ( Pfannensteihl) r/b/a of surgery were discussed with the patient including but not limited to infection , bleeding, damage to bowel bladder ureters with the need for further surgery. R/o Transfusion HIV/ Hep B&C discussed. Pt voiced understanding and desires to proceed.

## 2022-02-13 ENCOUNTER — Inpatient Hospital Stay (HOSPITAL_BASED_OUTPATIENT_CLINIC_OR_DEPARTMENT_OTHER)
Admission: RE | Admit: 2022-02-13 | Discharge: 2022-02-14 | DRG: 741 | Disposition: A | Discharge status: Home / Self Care | Payer: Medicare Other | Source: Ambulatory Visit | Attending: Obstetrics and Gynecology | Admitting: Obstetrics and Gynecology

## 2022-02-13 ENCOUNTER — Observation Stay (HOSPITAL_BASED_OUTPATIENT_CLINIC_OR_DEPARTMENT_OTHER): Payer: Medicare Other | Admitting: Anesthesiology

## 2022-02-13 ENCOUNTER — Other Ambulatory Visit: Payer: Self-pay

## 2022-02-13 ENCOUNTER — Encounter (HOSPITAL_COMMUNITY)
Admission: RE | Disposition: A | Discharge status: Home / Self Care | Payer: Self-pay | Source: Ambulatory Visit | Attending: Obstetrics and Gynecology

## 2022-02-13 ENCOUNTER — Encounter (HOSPITAL_BASED_OUTPATIENT_CLINIC_OR_DEPARTMENT_OTHER): Payer: Self-pay | Admitting: Obstetrics and Gynecology

## 2022-02-13 DIAGNOSIS — C50211 Malignant neoplasm of upper-inner quadrant of right female breast: Secondary | ICD-10-CM | POA: Diagnosis present

## 2022-02-13 DIAGNOSIS — Z90710 Acquired absence of both cervix and uterus: Secondary | ICD-10-CM

## 2022-02-13 DIAGNOSIS — Z01818 Encounter for other preprocedural examination: Principal | ICD-10-CM

## 2022-02-13 DIAGNOSIS — D069 Carcinoma in situ of cervix, unspecified: Secondary | ICD-10-CM | POA: Diagnosis present

## 2022-02-13 DIAGNOSIS — I10 Essential (primary) hypertension: Secondary | ICD-10-CM | POA: Diagnosis present

## 2022-02-13 DIAGNOSIS — E7800 Pure hypercholesterolemia, unspecified: Secondary | ICD-10-CM | POA: Diagnosis present

## 2022-02-13 DIAGNOSIS — D251 Intramural leiomyoma of uterus: Secondary | ICD-10-CM | POA: Diagnosis not present

## 2022-02-13 DIAGNOSIS — Z79811 Long term (current) use of aromatase inhibitors: Secondary | ICD-10-CM | POA: Diagnosis not present

## 2022-02-13 DIAGNOSIS — Z853 Personal history of malignant neoplasm of breast: Secondary | ICD-10-CM | POA: Diagnosis not present

## 2022-02-13 DIAGNOSIS — Z170 Estrogen receptor positive status [ER+]: Secondary | ICD-10-CM

## 2022-02-13 DIAGNOSIS — Z79899 Other long term (current) drug therapy: Secondary | ICD-10-CM | POA: Diagnosis not present

## 2022-02-13 HISTORY — PX: ROBOTIC ASSISTED LAPAROSCOPIC HYSTERECTOMY AND SALPINGECTOMY: SHX6379

## 2022-02-13 HISTORY — DX: Presence of dental prosthetic device (complete) (partial): Z97.2

## 2022-02-13 LAB — CBC
HCT: 33.4 % — ABNORMAL LOW (ref 36.0–46.0)
Hemoglobin: 11 g/dL — ABNORMAL LOW (ref 12.0–15.0)
MCH: 30.8 pg (ref 26.0–34.0)
MCHC: 32.9 g/dL (ref 30.0–36.0)
MCV: 93.6 fL (ref 80.0–100.0)
Platelets: 267 10*3/uL (ref 150–400)
RBC: 3.57 MIL/uL — ABNORMAL LOW (ref 3.87–5.11)
RDW: 13.1 % (ref 11.5–15.5)
WBC: 9.6 10*3/uL (ref 4.0–10.5)
nRBC: 0 % (ref 0.0–0.2)

## 2022-02-13 LAB — TYPE AND SCREEN
ABO/RH(D): O POS
Antibody Screen: NEGATIVE

## 2022-02-13 LAB — ABO/RH: ABO/RH(D): O POS

## 2022-02-13 SURGERY — XI ROBOTIC ASSISTED LAPAROSCOPIC HYSTERECTOMY AND SALPINGECTOMY
Anesthesia: General | Site: Abdomen | Laterality: Bilateral

## 2022-02-13 MED ORDER — FENTANYL CITRATE (PF) 100 MCG/2ML IJ SOLN
INTRAMUSCULAR | Status: AC
  Filled 2022-02-13: qty 2

## 2022-02-13 MED ORDER — SENNA 8.6 MG PO TABS
1.0000 | ORAL_TABLET | Freq: Two times a day (BID) | ORAL | Status: DC
  Administered 2022-02-13 – 2022-02-14 (×2): 8.6 mg via ORAL
  Filled 2022-02-13 (×2): qty 1

## 2022-02-13 MED ORDER — LIDOCAINE HCL (CARDIAC) PF 100 MG/5ML IV SOSY
PREFILLED_SYRINGE | INTRAVENOUS | Status: DC | PRN
  Administered 2022-02-13: 60 mg via INTRAVENOUS

## 2022-02-13 MED ORDER — SUGAMMADEX SODIUM 200 MG/2ML IV SOLN
INTRAVENOUS | Status: DC | PRN
  Administered 2022-02-13: 200 mg via INTRAVENOUS

## 2022-02-13 MED ORDER — ONDANSETRON HCL 4 MG/2ML IJ SOLN
4.0000 mg | Freq: Four times a day (QID) | INTRAMUSCULAR | Status: DC | PRN
  Administered 2022-02-13 (×2): 4 mg via INTRAVENOUS
  Filled 2022-02-13 (×2): qty 2

## 2022-02-13 MED ORDER — SIMETHICONE 80 MG PO CHEW: 80.0000 mg | CHEWABLE_TABLET | Freq: Four times a day (QID) | ORAL | Status: DC | PRN

## 2022-02-13 MED ORDER — IBUPROFEN 400 MG PO TABS: 600.0000 mg | ORAL_TABLET | Freq: Four times a day (QID) | ORAL | Status: DC

## 2022-02-13 MED ORDER — HYDROMORPHONE HCL 1 MG/ML IJ SOLN
0.2000 mg | INTRAMUSCULAR | Status: DC | PRN
  Administered 2022-02-13: 0.5 mg via INTRAVENOUS
  Filled 2022-02-13: qty 1

## 2022-02-13 MED ORDER — DEXAMETHASONE SODIUM PHOSPHATE 10 MG/ML IJ SOLN
INTRAMUSCULAR | Status: AC
  Filled 2022-02-13: qty 1

## 2022-02-13 MED ORDER — FENTANYL CITRATE (PF) 250 MCG/5ML IJ SOLN
INTRAMUSCULAR | Status: DC | PRN
  Administered 2022-02-13 (×3): 25 ug via INTRAVENOUS
  Administered 2022-02-13 (×2): 50 ug via INTRAVENOUS
  Administered 2022-02-13: 150 ug via INTRAVENOUS
  Administered 2022-02-13: 25 ug via INTRAVENOUS

## 2022-02-13 MED ORDER — METOPROLOL TARTRATE 50 MG PO TABS
50.0000 mg | ORAL_TABLET | Freq: Two times a day (BID) | ORAL | Status: DC
  Administered 2022-02-13 – 2022-02-14 (×2): 50 mg via ORAL
  Filled 2022-02-13 (×2): qty 1

## 2022-02-13 MED ORDER — ONDANSETRON HCL 4 MG/2ML IJ SOLN: 4.0000 mg | Freq: Once | INTRAMUSCULAR | Status: DC | PRN

## 2022-02-13 MED ORDER — LACTATED RINGERS IV SOLN: INTRAVENOUS | Status: DC

## 2022-02-13 MED ORDER — CELECOXIB 200 MG PO CAPS
400.0000 mg | ORAL_CAPSULE | ORAL | Status: AC
  Administered 2022-02-13: 400 mg via ORAL

## 2022-02-13 MED ORDER — EPHEDRINE 5 MG/ML INJ
INTRAVENOUS | Status: AC
  Filled 2022-02-13: qty 5

## 2022-02-13 MED ORDER — ONDANSETRON HCL 4 MG PO TABS: 4.0000 mg | ORAL_TABLET | Freq: Four times a day (QID) | ORAL | Status: DC | PRN

## 2022-02-13 MED ORDER — OXYCODONE HCL 5 MG PO TABS
5.0000 mg | ORAL_TABLET | ORAL | Status: DC | PRN
  Administered 2022-02-13: 10 mg via ORAL
  Filled 2022-02-13: qty 2

## 2022-02-13 MED ORDER — ACETAMINOPHEN 500 MG PO TABS
1000.0000 mg | ORAL_TABLET | Freq: Four times a day (QID) | ORAL | Status: DC
  Administered 2022-02-13 – 2022-02-14 (×4): 1000 mg via ORAL
  Filled 2022-02-13 (×4): qty 2

## 2022-02-13 MED ORDER — PHENYLEPHRINE 80 MCG/ML (10ML) SYRINGE FOR IV PUSH (FOR BLOOD PRESSURE SUPPORT)
PREFILLED_SYRINGE | INTRAVENOUS | Status: AC
  Filled 2022-02-13: qty 10

## 2022-02-13 MED ORDER — MIDAZOLAM HCL 5 MG/5ML IJ SOLN
INTRAMUSCULAR | Status: DC | PRN
  Administered 2022-02-13: 2 mg via INTRAVENOUS

## 2022-02-13 MED ORDER — AMISULPRIDE (ANTIEMETIC) 5 MG/2ML IV SOLN: 10.0000 mg | Freq: Once | INTRAVENOUS | Status: DC | PRN

## 2022-02-13 MED ORDER — FENTANYL CITRATE (PF) 100 MCG/2ML IJ SOLN
25.0000 ug | INTRAMUSCULAR | Status: DC | PRN
  Administered 2022-02-13 (×2): 25 ug via INTRAVENOUS
  Administered 2022-02-13: 50 ug via INTRAVENOUS

## 2022-02-13 MED ORDER — STERILE WATER FOR IRRIGATION IR SOLN
Status: DC | PRN
  Administered 2022-02-13: 500 mL

## 2022-02-13 MED ORDER — ROCURONIUM BROMIDE 10 MG/ML (PF) SYRINGE
PREFILLED_SYRINGE | INTRAVENOUS | Status: AC
  Filled 2022-02-13: qty 10

## 2022-02-13 MED ORDER — MENTHOL 3 MG MT LOZG: 1.0000 | LOZENGE | OROMUCOSAL | Status: DC | PRN

## 2022-02-13 MED ORDER — KETOROLAC TROMETHAMINE 30 MG/ML IJ SOLN
30.0000 mg | Freq: Four times a day (QID) | INTRAMUSCULAR | Status: AC
  Administered 2022-02-13 – 2022-02-14 (×4): 30 mg via INTRAVENOUS
  Filled 2022-02-13 (×4): qty 1

## 2022-02-13 MED ORDER — MIDAZOLAM HCL 2 MG/2ML IJ SOLN
INTRAMUSCULAR | Status: AC
  Filled 2022-02-13: qty 2

## 2022-02-13 MED ORDER — ONDANSETRON HCL 4 MG/2ML IJ SOLN
INTRAMUSCULAR | Status: AC
  Filled 2022-02-13: qty 2

## 2022-02-13 MED ORDER — PROPOFOL 10 MG/ML IV BOLUS
INTRAVENOUS | Status: DC | PRN
  Administered 2022-02-13: 100 mg via INTRAVENOUS

## 2022-02-13 MED ORDER — POVIDONE-IODINE 10 % EX SWAB
2.0000 | Freq: Once | CUTANEOUS | Status: DC
Start: 2022-02-13 — End: 2022-02-13

## 2022-02-13 MED ORDER — PROPOFOL 10 MG/ML IV BOLUS
INTRAVENOUS | Status: AC
  Filled 2022-02-13: qty 20

## 2022-02-13 MED ORDER — DEXAMETHASONE SODIUM PHOSPHATE 4 MG/ML IJ SOLN
INTRAMUSCULAR | Status: DC | PRN
  Administered 2022-02-13: 10 mg via INTRAVENOUS

## 2022-02-13 MED ORDER — SODIUM CHLORIDE 0.9 % IV SOLN
INTRAVENOUS | Status: AC
  Filled 2022-02-13: qty 2

## 2022-02-13 MED ORDER — PHENYLEPHRINE HCL (PRESSORS) 10 MG/ML IV SOLN
INTRAVENOUS | Status: DC | PRN
  Administered 2022-02-13 (×3): 80 ug via INTRAVENOUS

## 2022-02-13 MED ORDER — HYDROCHLOROTHIAZIDE 25 MG PO TABS
25.0000 mg | ORAL_TABLET | Freq: Every day | ORAL | Status: DC
  Administered 2022-02-13 – 2022-02-14 (×2): 25 mg via ORAL
  Filled 2022-02-13 (×2): qty 1

## 2022-02-13 MED ORDER — SIMVASTATIN 20 MG PO TABS
10.0000 mg | ORAL_TABLET | Freq: Every evening | ORAL | Status: DC
  Administered 2022-02-13: 10 mg via ORAL
  Filled 2022-02-13: qty 1

## 2022-02-13 MED ORDER — ACETAMINOPHEN 500 MG PO TABS
1000.0000 mg | ORAL_TABLET | ORAL | Status: AC
  Administered 2022-02-13: 1000 mg via ORAL

## 2022-02-13 MED ORDER — PANTOPRAZOLE SODIUM 40 MG PO TBEC
40.0000 mg | DELAYED_RELEASE_TABLET | Freq: Every day | ORAL | Status: DC
  Administered 2022-02-13 – 2022-02-14 (×2): 40 mg via ORAL
  Filled 2022-02-13 (×2): qty 1

## 2022-02-13 MED ORDER — ROCURONIUM BROMIDE 100 MG/10ML IV SOLN
INTRAVENOUS | Status: DC | PRN
  Administered 2022-02-13: 50 mg via INTRAVENOUS
  Administered 2022-02-13: 10 mg via INTRAVENOUS

## 2022-02-13 MED ORDER — EPHEDRINE SULFATE (PRESSORS) 50 MG/ML IJ SOLN
INTRAMUSCULAR | Status: DC | PRN
  Administered 2022-02-13: 20 mg via INTRAVENOUS
  Administered 2022-02-13: 10 mg via INTRAVENOUS

## 2022-02-13 MED ORDER — FENTANYL CITRATE (PF) 100 MCG/2ML IJ SOLN: INTRAMUSCULAR | Status: DC | PRN

## 2022-02-13 MED ORDER — CELECOXIB 200 MG PO CAPS
ORAL_CAPSULE | ORAL | Status: AC
  Filled 2022-02-13: qty 2

## 2022-02-13 MED ORDER — LIDOCAINE HCL (PF) 2 % IJ SOLN
INTRAMUSCULAR | Status: AC
  Filled 2022-02-13: qty 5

## 2022-02-13 MED ORDER — HEMOSTATIC AGENTS (NO CHARGE) OPTIME
TOPICAL | Status: DC | PRN
  Administered 2022-02-13: 1 via TOPICAL

## 2022-02-13 MED ORDER — FENTANYL CITRATE (PF) 250 MCG/5ML IJ SOLN
INTRAMUSCULAR | Status: AC
Start: 2022-02-13 — End: ?
  Filled 2022-02-13: qty 5

## 2022-02-13 MED ORDER — SODIUM CHLORIDE 0.9 % IV SOLN
2.0000 g | INTRAVENOUS | Status: AC
  Administered 2022-02-13: 2 g via INTRAVENOUS

## 2022-02-13 MED ORDER — SODIUM CHLORIDE 0.9 % IR SOLN
Status: DC | PRN
  Administered 2022-02-13: 2000 mL

## 2022-02-13 MED ORDER — GABAPENTIN 300 MG PO CAPS
ORAL_CAPSULE | ORAL | Status: AC
  Filled 2022-02-13: qty 1

## 2022-02-13 MED ORDER — LIP MEDEX EX OINT
TOPICAL_OINTMENT | CUTANEOUS | Status: DC | PRN
  Administered 2022-02-13: 1 via TOPICAL
  Filled 2022-02-13: qty 7

## 2022-02-13 MED ORDER — ACETAMINOPHEN 500 MG PO TABS
ORAL_TABLET | ORAL | Status: AC
  Filled 2022-02-13: qty 2

## 2022-02-13 MED ORDER — ONDANSETRON HCL 4 MG/2ML IJ SOLN
INTRAMUSCULAR | Status: DC | PRN
  Administered 2022-02-13: 4 mg via INTRAVENOUS

## 2022-02-13 MED ORDER — GABAPENTIN 300 MG PO CAPS
300.0000 mg | ORAL_CAPSULE | ORAL | Status: AC
  Administered 2022-02-13: 300 mg via ORAL

## 2022-02-13 MED ORDER — ALUM & MAG HYDROXIDE-SIMETH 200-200-20 MG/5ML PO SUSP: 30.0000 mL | ORAL | Status: DC | PRN

## 2022-02-13 SURGICAL SUPPLY — 92 items
ADH SKN CLS APL DERMABOND .7 (GAUZE/BANDAGES/DRESSINGS) ×1
APL SRG 38 LTWT LNG FL B (MISCELLANEOUS)
APPLICATOR ARISTA FLEXITIP XL (MISCELLANEOUS) IMPLANT
BARRIER ADHS 3X4 INTERCEED (GAUZE/BANDAGES/DRESSINGS) IMPLANT
BRR ADH 4X3 ABS CNTRL BYND (GAUZE/BANDAGES/DRESSINGS)
CATH FOLEY 3WAY  5CC 16FR (CATHETERS) ×2
CATH FOLEY 3WAY 5CC 16FR (CATHETERS) ×2 IMPLANT
CELLS DAT CNTRL 66122 CELL SVR (MISCELLANEOUS) IMPLANT
COVER BACK TABLE 60X90IN (DRAPES) ×3 IMPLANT
COVER TIP SHEARS 8 DVNC (MISCELLANEOUS) ×2 IMPLANT
COVER TIP SHEARS 8MM DA VINCI (MISCELLANEOUS)
DECANTER SPIKE VIAL GLASS SM (MISCELLANEOUS) ×4 IMPLANT
DEFOGGER SCOPE WARMER CLEARIFY (MISCELLANEOUS) ×2 IMPLANT
DERMABOND ADVANCED (GAUZE/BANDAGES/DRESSINGS) ×1
DERMABOND ADVANCED .7 DNX12 (GAUZE/BANDAGES/DRESSINGS) ×2 IMPLANT
DILATOR CANAL MILEX (MISCELLANEOUS) ×3 IMPLANT
DRAPE ARM DVNC X/XI (DISPOSABLE) ×8 IMPLANT
DRAPE CESAREAN BIRTH W POUCH (DRAPES) ×1 IMPLANT
DRAPE COLUMN DVNC XI (DISPOSABLE) ×2 IMPLANT
DRAPE DA VINCI XI ARM (DISPOSABLE)
DRAPE DA VINCI XI COLUMN (DISPOSABLE)
DRAPE UTILITY 15X26 TOWEL STRL (DRAPES) ×3 IMPLANT
DRSG OPSITE POSTOP 4X10 (GAUZE/BANDAGES/DRESSINGS) ×1 IMPLANT
DURAPREP 26ML APPLICATOR (WOUND CARE) ×4 IMPLANT
ELECT REM PT RETURN 9FT ADLT (ELECTROSURGICAL) ×2
ELECTRODE REM PT RTRN 9FT ADLT (ELECTROSURGICAL) ×2 IMPLANT
GAUZE 4X4 16PLY ~~LOC~~+RFID DBL (SPONGE) ×6 IMPLANT
GLOVE BIOGEL M 6.5 STRL (GLOVE) ×9 IMPLANT
GLOVE BIOGEL M 7.0 STRL (GLOVE) ×9 IMPLANT
GLOVE BIOGEL PI IND STRL 6.5 (GLOVE) ×12 IMPLANT
GLOVE BIOGEL PI IND STRL 7.0 (GLOVE) ×12 IMPLANT
GLOVE BIOGEL PI INDICATOR 6.5 (GLOVE) ×6
GLOVE BIOGEL PI INDICATOR 7.0 (GLOVE) ×6
HEMOSTAT ARISTA ABSORB 3G PWDR (HEMOSTASIS) ×1 IMPLANT
HIBICLENS CHG 4% 4OZ BTL (MISCELLANEOUS) ×1 IMPLANT
HOLDER FOLEY CATH W/STRAP (MISCELLANEOUS) ×1 IMPLANT
IRRIG SUCT STRYKERFLOW 2 WTIP (MISCELLANEOUS)
IRRIGATION SUCT STRKRFLW 2 WTP (MISCELLANEOUS) ×2 IMPLANT
KIT TURNOVER CYSTO (KITS) ×3 IMPLANT
LEGGING LITHOTOMY PAIR STRL (DRAPES) ×2 IMPLANT
MANIFOLD NEPTUNE II (INSTRUMENTS) ×3 IMPLANT
NS IRRIG 1000ML POUR BTL (IV SOLUTION) ×2 IMPLANT
OBTURATOR OPTICAL STANDARD 8MM (TROCAR)
OBTURATOR OPTICAL STND 8 DVNC (TROCAR)
OBTURATOR OPTICALSTD 8 DVNC (TROCAR) ×2 IMPLANT
OCCLUDER COLPOPNEUMO (BALLOONS) ×2 IMPLANT
PACK ROBOT WH (CUSTOM PROCEDURE TRAY) ×3 IMPLANT
PACK ROBOTIC GOWN (GOWN DISPOSABLE) ×3 IMPLANT
PACK TRENDGUARD 450 HYBRID PRO (MISCELLANEOUS) IMPLANT
PAD OB MATERNITY 4.3X12.25 (PERSONAL CARE ITEMS) ×3 IMPLANT
PAD PREP 24X48 CUFFED NSTRL (MISCELLANEOUS) ×3 IMPLANT
PROTECTOR NERVE ULNAR (MISCELLANEOUS) ×2 IMPLANT
RETRACTOR WND ALEXIS 18 MED (MISCELLANEOUS) IMPLANT
RTRCTR WOUND ALEXIS 18CM MED (MISCELLANEOUS)
RTRCTR WOUND ALEXIS 18CM SML (INSTRUMENTS)
SAVER CELL AAL HAEMONETICS (INSTRUMENTS) IMPLANT
SCISSORS LAP 5X45 EPIX DISP (ENDOMECHANICALS) IMPLANT
SEAL CANN UNIV 5-8 DVNC XI (MISCELLANEOUS) ×8 IMPLANT
SEAL XI 5MM-8MM UNIVERSAL (MISCELLANEOUS)
SEALER VESSEL DA VINCI XI (MISCELLANEOUS)
SEALER VESSEL EXT DVNC XI (MISCELLANEOUS) IMPLANT
SET IRRIG Y TYPE TUR BLADDER L (SET/KITS/TRAYS/PACK) IMPLANT
SET TRI-LUMEN FLTR TB AIRSEAL (TUBING) ×2 IMPLANT
SOL PREP POV-IOD 4OZ 10% (MISCELLANEOUS) ×1 IMPLANT
SPONGE T-LAP 18X18 ~~LOC~~+RFID (SPONGE) ×4 IMPLANT
SPONGE T-LAP 4X18 ~~LOC~~+RFID (SPONGE) ×2 IMPLANT
SUT PDS AB 0 CT1 27 (SUTURE) ×2 IMPLANT
SUT VIC AB 0 CT1 27 (SUTURE) ×4
SUT VIC AB 0 CT1 27XBRD ANBCTR (SUTURE) ×4 IMPLANT
SUT VIC AB 0 CT1 27XCR 8 STRN (SUTURE) IMPLANT
SUT VIC AB 0 CT1 36 (SUTURE) ×1 IMPLANT
SUT VIC AB 2-0 CT1 27 (SUTURE) ×2
SUT VIC AB 2-0 CT1 TAPERPNT 27 (SUTURE) IMPLANT
SUT VIC AB 2-0 SH 27 (SUTURE) ×2
SUT VIC AB 2-0 SH 27XBRD (SUTURE) IMPLANT
SUT VIC AB 4-0 KS 27 (SUTURE) ×1 IMPLANT
SUT VICRYL 0 TIES 12 18 (SUTURE) ×1 IMPLANT
SUT VICRYL 0 UR6 27IN ABS (SUTURE) IMPLANT
SUT VICRYL RAPIDE 4/0 PS 2 (SUTURE) ×6 IMPLANT
SUT VLOC 180 0 9IN  GS21 (SUTURE)
SUT VLOC 180 0 9IN GS21 (SUTURE) ×2 IMPLANT
TIP RUMI ORANGE 6.7MMX12CM (TIP) IMPLANT
TIP UTERINE 5.1X6CM LAV DISP (MISCELLANEOUS) IMPLANT
TIP UTERINE 6.7X10CM GRN DISP (MISCELLANEOUS) IMPLANT
TIP UTERINE 6.7X6CM WHT DISP (MISCELLANEOUS) IMPLANT
TIP UTERINE 6.7X8CM BLUE DISP (MISCELLANEOUS) IMPLANT
TOWEL OR 17X26 10 PK STRL BLUE (TOWEL DISPOSABLE) ×3 IMPLANT
TRENDGUARD 450 HYBRID PRO PACK (MISCELLANEOUS)
TROCAR PORT AIRSEAL 8X120 (TROCAR) ×2 IMPLANT
TUBE CONNECTING 12X1/4 (SUCTIONS) ×1 IMPLANT
WATER STERILE IRR 1000ML POUR (IV SOLUTION) ×2 IMPLANT
WATER STERILE IRR 500ML POUR (IV SOLUTION) ×1 IMPLANT

## 2022-02-13 NOTE — Anesthesia Preprocedure Evaluation (Addendum)
Anesthesia Evaluation  ?Patient identified by MRN, date of birth, ID band ?Patient awake ? ? ? ?Reviewed: ?Allergy & Precautions, NPO status , Patient's Chart, lab work & pertinent test results ? ?Airway ?Mallampati: II ? ?TM Distance: >3 FB ?Neck ROM: Full ? ? ? Dental ?no notable dental hx. ? ?  ?Pulmonary ?neg pulmonary ROS,  ?  ?Pulmonary exam normal ? ? ? ? ? ? ? Cardiovascular ?hypertension, Pt. on medications and Pt. on home beta blockers ?Normal cardiovascular exam ? ?ECG: SR, rate 66 ?  ?Neuro/Psych ?negative neurological ROS ? negative psych ROS  ? GI/Hepatic ?negative GI ROS, Neg liver ROS,   ?Endo/Other  ?negative endocrine ROS ? Renal/GU ?negative Renal ROS  ? ?  ?Musculoskeletal ?negative musculoskeletal ROS ?(+)  ? Abdominal ?  ?Peds ? Hematology ? ?(+) Blood dyscrasia, anemia ,   ?Anesthesia Other Findings ?Cervical Intraepithelial Neoplasia Grade Three  ?Firboids, Intramural ? Reproductive/Obstetrics ? ?  ? ? ? ? ? ? ? ? ? ? ? ? ? ?  ?  ? ? ? ? ? ? ? ?Anesthesia Physical ?Anesthesia Plan ? ?ASA: 2 ? ?Anesthesia Plan: General  ? ?Post-op Pain Management:   ? ?Induction: Intravenous ? ?PONV Risk Score and Plan: 4 or greater and Ondansetron, Dexamethasone, Propofol infusion, Midazolam and Treatment may vary due to age or medical condition ? ?Airway Management Planned: Oral ETT ? ?Additional Equipment:  ? ?Intra-op Plan:  ? ?Post-operative Plan: Extubation in OR ? ?Informed Consent:  ? ? ? ?Dental advisory given ? ?Plan Discussed with: CRNA ? ?Anesthesia Plan Comments:   ? ? ? ? ? ?Anesthesia Quick Evaluation ? ?

## 2022-02-13 NOTE — Anesthesia Procedure Notes (Signed)
Procedure Name: Intubation ?Date/Time: 02/13/2022 1:36 PM ?Performed by: Justice Rocher, CRNA ?Pre-anesthesia Checklist: Patient identified, Emergency Drugs available, Suction available, Patient being monitored and Timeout performed ?Patient Re-evaluated:Patient Re-evaluated prior to induction ?Oxygen Delivery Method: Circle system utilized ?Preoxygenation: Pre-oxygenation with 100% oxygen ?Induction Type: IV induction ?Ventilation: Mask ventilation without difficulty ?Laryngoscope Size: Mac and 3 ?Grade View: Grade II ?Tube type: Oral ?Tube size: 7.0 mm ?Number of attempts: 1 ?Airway Equipment and Method: Stylet and Oral airway ?Placement Confirmation: ETT inserted through vocal cords under direct vision, positive ETCO2, breath sounds checked- equal and bilateral and CO2 detector ?Secured at: 23 cm ?Tube secured with: Tape ?Dental Injury: Teeth and Oropharynx as per pre-operative assessment  ? ? ? ? ?

## 2022-02-13 NOTE — Op Note (Signed)
02/13/2022 ? ?3:43 PM ? ?PATIENT:  Emily Ross  66 y.o. female ? ?PRE-OPERATIVE DIAGNOSIS:  Cervical Intraepithelial Neoplasia Grade Three ?Firboids, Intramural ? ?POST-OPERATIVE DIAGNOSIS:  Cervical Intraepithelial Neoplasia Grade Three ?Firboids, Intramural ? ?PROCEDURE:  Procedure(s): ?TOTAL ABDOMINALHYSTERECTOMY AND SALPINGO-OOPHORECTOMY (Bilateral) ? ?SURGEON:  Surgeon(s) and Role: ?   Christophe Louis, MD - Primary ?   Delora Fuel, Lenna Sciara, DO - Assisting ? ?PHYSICIAN ASSISTANT:  ? ?ASSISTANTS: Dr. Drema Dallas assisted due to complexity of the anatomy   ? ?ANESTHESIA:   general ? ?EBL:  100 mL  ? ?BLOOD ADMINISTERED:none ? ?DRAINS: Urinary Catheter (Foley)  ? ?LOCAL MEDICATIONS USED:  NONE ? ?SPECIMEN:  Source of Specimen:  Uterus cervix and bilateral fallopian tubes and ovaries  ? ?DISPOSITION OF SPECIMEN:  PATHOLOGY ? ?COUNTS:  YES ? ?TOURNIQUET:  * No tourniquets in log * ? ?DICTATION: .Note written in EPIC ? ?PLAN OF CARE: Admit to inpatient  ? ?PATIENT DISPOSITION:  PACU - hemodynamically stable. ?  ?Delay start of Pharmacological VTE agent (>24hrs) due to surgical blood loss or risk of bleeding: not applicable ? ?Findings. Normal external genitalia. Atrophic vaginal mucosa. The cervix is flush with the vaginal mucosa. Normal appearing uterus.. fallopian tubes with filsche clips in place. Normal appearing ovaries bilaterally ? ? ? ?Procedure Details  ?The patient was seen in the Holding Room. The risks, benefits, complications, treatment options, and expected outcomes were discussed with the patient.  The patient concurred with the proposed plan, giving informed consent.   The patient was taken to Operating Room # 5, identified as NORMAGENE HARVIE and the procedure verified as Total abdominal hysterectomy, bilateral salpingo-oophorectomy. A Time Out was held and the above information confirmed. ? ?After induction of anesthesia, the patient was draped and prepped in the usual sterile manner. Pt was placed in  supine position after anesthesia and draped and prepped in the usual sterile manner. Foley catheter was placed. ? ?A pfannensteihl  incision was made and carried through the subcutaneous tissue to the fascia. Fascial incision was made and extended laterally . The rectus muscles were separated. The peritoneum was identified and entered. Peritoneal incision was extended longitudinally. ? ?The above findings were noted. O'connor -Sullivan retractor was placed and bowel was packed away from the surgical site.  ? ?The round ligaments were identified, cut, and ligated with 0-Vicryl. The anterior peritoneal reflection was incised and the bladder was dissected off the lower uterine segment. The retroperitoneal space was explored and the ureters were identified bilaterally. The right infundibulo-pelvic ligament was grasped, cut, and suture ligated with 0-Vicryl. The left infundibulo-pelvic ligament was grasped, cut and suture ligated with 0-Vicryl. Hemostasis  ?was observed. The uterine vessels were skeletonized, then clamped, cut and suture ligated with 0-Vicryl suture. Serial pedicles of the cardinal and utero-sacral ligaments were clamped, cut, and suture ligated with 0-Vicryl. Entrance was made into the vagina and the uterus removed. Vaginal cuff angle sutures were placed incorporating the utero-sacral ligaments for support. The vaginal cuff was then closed with a running stitch of 0- ?Vicryl. Lavage was carried out until clear. Hemostasis was observed. ? ?Retractor and all packing was removed from the abdomen. The fascia was approximated with running sutures of 0 pdsl. Lavage was again carried out. Hemostasis was observed. The skin was approximated with staples. ? ?Instrument, sponge, and needle counts were correct prior to abdominal closure and at the conclusion of the case.  ? ? ?Estimated Blood Loss:  less than 100 mL ? ?        ?  Complications:  None; patient tolerated the procedure well. ?        ?Disposition: PACU  - hemodynamically stable. ?        ?Condition: stable ? ?Attending Attestation: I performed the procedure. ? ?

## 2022-02-13 NOTE — H&P (Signed)
Date of Initial H&P: 02/12/2022 ? ?History reviewed, patient examined, no change in status, stable for surgery.  ?

## 2022-02-13 NOTE — Anesthesia Postprocedure Evaluation (Signed)
Anesthesia Post Note ? ?Patient: OTHELL DILUZIO ? ?Procedure(s) Performed: TOTAL ABDOMINALHYSTERECTOMY AND SALPINGO-OOPHORECTOMY (Bilateral: Abdomen) ? ?  ? ?Patient location during evaluation: PACU ?Anesthesia Type: General ?Level of consciousness: awake ?Pain management: pain level controlled ?Vital Signs Assessment: post-procedure vital signs reviewed and stable ?Respiratory status: spontaneous breathing, nonlabored ventilation, respiratory function stable and patient connected to nasal cannula oxygen ?Cardiovascular status: blood pressure returned to baseline and stable ?Postop Assessment: no apparent nausea or vomiting ?Anesthetic complications: no ? ? ?No notable events documented. ? ?Last Vitals:  ?Vitals:  ? 02/13/22 1822 02/13/22 1955  ?BP: 128/62 (!) 108/50  ?Pulse: 72 78  ?Resp: 18 16  ?Temp: 36.7 ?C 36.4 ?C  ?SpO2: 98% 96%  ?  ?Last Pain:  ?Vitals:  ? 02/13/22 1955  ?TempSrc: Oral  ?PainSc:   ? ? ?  ?  ?  ?  ?  ?  ? ?Amenah Tucci P Fidelia Cathers ? ? ? ? ?

## 2022-02-13 NOTE — Transfer of Care (Signed)
Immediate Anesthesia Transfer of Care Note ? ?Patient: [name] ? ?Procedure(s) Performed: Procedure(s) (LRB): ?TOTAL ABDOMINALHYSTERECTOMY AND SALPINGO-OOPHORECTOMY (Bilateral) ? ?Patient Location: PACU ? ?Anesthesia Type: General ? ?Level of Consciousness: awake, sedated, patient cooperative and responds to stimulation ? ?Airway & Oxygen Therapy: Patient Spontanous Breathing and Patient connected to [place] 02 and soft FM  ? ?Post-op Assessment: Report given to PACU RN, Post -op Vital signs reviewed and stable and Patient moving all extremities ? ?Post vital signs: Reviewed and stable ? ?Complications: No apparent anesthesia complications ?

## 2022-02-13 NOTE — Progress Notes (Signed)
Patient moved to [facility] room 3018.  Report given to Volanda Napoleon, RN.   ?

## 2022-02-14 ENCOUNTER — Encounter (HOSPITAL_BASED_OUTPATIENT_CLINIC_OR_DEPARTMENT_OTHER): Payer: Self-pay | Admitting: Obstetrics and Gynecology

## 2022-02-14 LAB — CBC
HCT: 28.5 % — ABNORMAL LOW (ref 36.0–46.0)
Hemoglobin: 9.5 g/dL — ABNORMAL LOW (ref 12.0–15.0)
MCH: 31.8 pg (ref 26.0–34.0)
MCHC: 33.3 g/dL (ref 30.0–36.0)
MCV: 95.3 fL (ref 80.0–100.0)
Platelets: 213 10*3/uL (ref 150–400)
RBC: 2.99 MIL/uL — ABNORMAL LOW (ref 3.87–5.11)
RDW: 13.2 % (ref 11.5–15.5)
WBC: 8.3 10*3/uL (ref 4.0–10.5)
nRBC: 0 % (ref 0.0–0.2)

## 2022-02-14 MED ORDER — OXYCODONE HCL 5 MG PO TABS: 5.0000 mg | ORAL_TABLET | ORAL | 0 refills | Status: DC | PRN

## 2022-02-14 MED ORDER — ACETAMINOPHEN 500 MG PO TABS
1000.0000 mg | ORAL_TABLET | Freq: Three times a day (TID) | ORAL | 0 refills | Status: AC | PRN
Start: 2022-02-14 — End: ?

## 2022-02-14 MED ORDER — IBUPROFEN 600 MG PO TABS: 600.0000 mg | ORAL_TABLET | Freq: Four times a day (QID) | ORAL | 1 refills | Status: DC | PRN

## 2022-02-14 MED ORDER — CHLORHEXIDINE GLUCONATE CLOTH 2 % EX PADS
6.0000 | MEDICATED_PAD | Freq: Every day | CUTANEOUS | Status: DC
  Administered 2022-02-14: 6 via TOPICAL

## 2022-02-14 NOTE — Discharge Summary (Signed)
Physician Discharge Summary  ?Patient ID: ?Emily Ross ?MRN: [id number] ?DOB/AGE: 11-21-1955 66 y.o. ? ?Admit date: 02/13/2022 ?Discharge date: 02/14/2022 ? ?Admission Diagnoses:Severe Cervical dysplasia CIN III ? ?Discharge Diagnoses:  ?Principal Problem: ?  CIN III (cervical intraepithelial neoplasia grade III) with severe dysplasia ?Active Problems: ?  S/P total hysterectomy and bilateral salpingo-oophorectomy ? ? ?Discharged Condition: stable ? ?Hospital Course: pt was admitted after undergoing a Total abdominal hysterescomy BSO . She did well postoperatively with return of bladder function and tolerating po. Pain was well controlled on POD #1. She desires early discharge home  ? ?Consults: None ? ?Significant Diagnostic Studies: labs: pod#1 hgb 9.5 ? ?Treatments: surgery: TAH/BSO ? ?Discharge Exam: ?Blood pressure (!) 140/57, pulse 77, temperature 98.7 ?F (37.1 ?C), temperature source Oral, resp. rate 16, height '5\' 6"'$  (1.676 m), weight 72.1 kg, last menstrual period 12/03/2011, SpO2 99 %. ?General appearance: alert, cooperative, and no distress ?Resp: No respiratory distress  ?GI: soft appropriately tender nondistended  ?Extremities: extremities normal, atraumatic, no cyanosis or edema ?Incision/Wound: ? ?Disposition: Discharge disposition: 01-Home or Self Care ? ? ? ? ? ? ?Discharge Instructions   ? ?  Remove dressing in 72 hours   Complete by: As directed ?  ? Call MD for:  persistant nausea and vomiting   Complete by: As directed ?  ? Call MD for:  redness, tenderness, or signs of infection (pain, swelling, redness, odor or green/yellow discharge around incision site)   Complete by: As directed ?  ? Call MD for:  severe uncontrolled pain   Complete by: As directed ?  ? Call MD for:  temperature >100.4   Complete by: As directed ?  ? Diet - low sodium heart healthy   Complete by: As directed ?  ? Driving Restrictions   Complete by: As directed ?  ? Avoid driving for 2 weeks  ? Increase activity slowly    Complete by: As directed ?  ? Lifting restrictions   Complete by: As directed ?  ? Avoid lifting over 10 lbs  ? May shower / Bathe   Complete by: As directed ?  ? Patient may shower ... Avoid baths for now  ? May walk up steps   Complete by: As directed ?  ? Sexual Activity Restrictions   Complete by: As directed ?  ? Avoid sex for 6 weeks  ? ?  ? ?Allergies as of 02/14/2022   ? ?   Reactions  ? Tomato Swelling  ? ?  ? ?  ?Medication List  ?  ? ?TAKE these medications   ? ?acetaminophen 500 MG tablet ?Commonly known as: TYLENOL ?Take 2 tablets (1,000 mg total) by mouth every 8 (eight) hours as needed. ?  ?anastrozole 1 MG tablet ?Commonly known as: ARIMIDEX ?Take 1 tablet (1 mg total) by mouth daily. ?  ?Back & Body Extra Strength 500-32.5 MG Tabs ?Generic drug: Aspirin-Caffeine ?Take 1 tablet by mouth every 6 (six) hours as needed (pain). ?  ?Dialyvite Vitamin D 5000 125 MCG (5000 UT) capsule ?Generic drug: Cholecalciferol ?Take 5,000 Units by mouth daily. ?  ?diphenhydrAMINE 25 MG tablet ?Commonly known as: BENADRYL ?Take 25 mg by mouth daily as needed for allergies. ?  ?famotidine 10 MG tablet ?Commonly known as: PEPCID ?Take 10 mg by mouth as needed. ?  ?hydrochlorothiazide 25 MG tablet ?Commonly known as: HYDRODIURIL ?TAKE ONE TABLET BY MOUTH ONE TIME DAILY ?  ?ibuprofen 600 MG tablet ?Commonly known as: ADVIL ?Take 1 tablet (  600 mg total) by mouth every 6 (six) hours as needed. ?  ?Lubricating Eye Drops 0.5-0.9 % ophthalmic solution ?Generic drug: carboxymethylcellul-glycerin ?Place 1 drop into both eyes as needed (dry eyes). ?  ?metoprolol tartrate 50 MG tablet ?Commonly known as: LOPRESSOR ?TAKE ONE TABLET BY MOUTH TWICE DAILY ?  ?oxyCODONE 5 MG immediate release tablet ?Commonly known as: Oxy IR/ROXICODONE ?Take 1-2 tablets (5-10 mg total) by mouth every 4 (four) hours as needed for moderate pain. ?What changed:  ?how much to take ?when to take this ?reasons to take this ?  ?RED YEAST RICE PO ?Take 1 tablet  by mouth at bedtime. ?  ?simvastatin 10 MG tablet ?Commonly known as: ZOCOR ?TAKE ONE TABLET BY MOUTH DAILY IN THE EVENING ?What changed: when to take this ?  ? ?  ? ? Follow-up Information   ? ? Christophe Louis, MD. Go in 2 week(s).   ?Specialty: Obstetrics and Gynecology ?Contact information: ?[address] ?[address] ?[place] [address] ?[phone] ? ? ?  ?  ? ?  ?  ? ?  ? ? ?Signed: ?Christophe Louis ?02/14/2022, 3:48 PM ? ? ?

## 2022-02-14 NOTE — Progress Notes (Signed)
Discharge instructions given to patient and all questions were answered.  

## 2022-02-14 NOTE — Progress Notes (Signed)
Transition of Care (TOC) Screening Note ? ?Patient Details  ?Name: Emily Ross ?Date of Birth: 02/19/1956 ? ?Transition of Care (TOC) CM/SW Contact:    ?Sherie Don, LCSW ?Phone Number: ?02/14/2022, 11:22 AM ? ?Transition of Care Department [facility]) has reviewed patient and no TOC needs have been identified at this time. We will continue to monitor patient advancement through interdisciplinary progression rounds. If new patient transition needs arise, please place a TOC consult. ?

## 2022-02-15 ENCOUNTER — Telehealth: Payer: Self-pay

## 2022-02-15 LAB — SURGICAL PATHOLOGY

## 2022-02-15 NOTE — Telephone Encounter (Signed)
Transition Care Management Follow-up Telephone Call Date of discharge and from where: 02/14/2022 [place]  How have you been since you were released from the hospital? Pt states she is doing good  Any questions or concerns? No  Items Reviewed: Did the pt receive and understand the discharge instructions provided? Yes  Medications obtained and verified? Yes  Other? Yes  Any new allergies since your discharge? No  Dietary orders reviewed? Yes Do you have support at home? Yes   Home Care and Equipment/Supplies: Were home health services ordered? no If so, what is the name of the agency? N/a  Has the agency set up a time to come to the patient's home? no Were any new equipment or medical supplies ordered?  No What is the name of the medical supply agency? N/a Were you able to get the supplies/equipment? no Do you have any questions related to the use of the equipment or supplies? No  Functional Questionnaire: (I = Independent and D = Dependent) ADLs: i  Bathing/Dressing- i  Meal Prep- i  Eating- i  Maintaining continence- i  Transferring/Ambulation- i  Managing Meds- i  Follow up appointments reviewed:  PCP Hospital f/u appt confirmed? No  Scheduled to see n/a on n/a @ n/a. [facility] f/u appt confirmed? Yes  Scheduled to see surgeon on n/a @ n/a. Are transportation arrangements needed? No  If their condition worsens, is the pt aware to call PCP or go to the Emergency Dept.? Yes Was the patient provided with contact information for the PCP's office or ED? Yes Was to pt encouraged to call back with questions or concerns? Yes

## 2022-04-12 ENCOUNTER — Other Ambulatory Visit: Payer: Self-pay | Admitting: Nurse Practitioner

## 2022-04-12 DIAGNOSIS — I10 Essential (primary) hypertension: Secondary | ICD-10-CM

## 2022-04-12 DIAGNOSIS — Z1231 Encounter for screening mammogram for malignant neoplasm of breast: Secondary | ICD-10-CM

## 2022-04-26 LAB — HM MAMMOGRAPHY: HM Mammogram: ABNORMAL — AB (ref 0–4)

## 2022-05-01 ENCOUNTER — Encounter: Payer: Medicare Other | Admitting: Nurse Practitioner

## 2022-05-01 VITALS — BP 130/76 | HR 67 | Temp 98.4°F | Ht 66.0 in | Wt 159.0 lb

## 2022-05-01 DIAGNOSIS — I10 Essential (primary) hypertension: Secondary | ICD-10-CM

## 2022-05-01 NOTE — Progress Notes (Signed)
Patient presents today for BPC. She is currently taking HCTZ 25mg  and Metoprolol 50mg .   BP Readings from Last 3 Encounters:  10/29/22 (!) 163/93  10/24/22 128/70  06/14/22 128/68   Pt advised to increase water intake and decrease water intake.   I have provided oversight concerning  ,CMA evaluation and treatment of this patient's health issues addressed during today's encounter. I agree with the assessment and plan as outlined in the note above.   Arnette Felts, DNP, FNP-BC [facility]

## 2022-05-05 ENCOUNTER — Other Ambulatory Visit: Payer: Self-pay | Admitting: Nurse Practitioner

## 2022-05-05 DIAGNOSIS — E7800 Pure hypercholesterolemia, unspecified: Secondary | ICD-10-CM

## 2022-05-05 DIAGNOSIS — I10 Essential (primary) hypertension: Secondary | ICD-10-CM

## 2022-05-17 ENCOUNTER — Encounter: Payer: Self-pay | Admitting: Nurse Practitioner

## 2022-05-17 ENCOUNTER — Ambulatory Visit (INDEPENDENT_AMBULATORY_CARE_PROVIDER_SITE_OTHER): Payer: Medicare Other | Admitting: Nurse Practitioner

## 2022-05-17 VITALS — BP 138/70 | HR 65 | Temp 97.9°F | Ht 66.0 in | Wt 159.0 lb

## 2022-05-17 DIAGNOSIS — Z79899 Other long term (current) drug therapy: Secondary | ICD-10-CM

## 2022-05-17 DIAGNOSIS — Z0000 Encounter for general adult medical examination without abnormal findings: Secondary | ICD-10-CM

## 2022-05-17 DIAGNOSIS — R7303 Prediabetes: Secondary | ICD-10-CM

## 2022-05-17 DIAGNOSIS — E7800 Pure hypercholesterolemia, unspecified: Secondary | ICD-10-CM

## 2022-05-17 DIAGNOSIS — I10 Essential (primary) hypertension: Secondary | ICD-10-CM

## 2022-05-17 DIAGNOSIS — E2839 Other primary ovarian failure: Secondary | ICD-10-CM

## 2022-05-17 LAB — POCT URINALYSIS DIPSTICK
Bilirubin, UA: NEGATIVE
Blood, UA: NEGATIVE
Glucose, UA: NEGATIVE
Ketones, UA: NEGATIVE
Leukocytes, UA: NEGATIVE
Nitrite, UA: NEGATIVE
Protein, UA: NEGATIVE
Spec Grav, UA: 1.015 (ref 1.010–1.025)
Urobilinogen, UA: 0.2 E.U./dL
pH, UA: 7 (ref 5.0–8.0)

## 2022-05-17 NOTE — Progress Notes (Signed)
I,Tianna Badgett,acting as a Education administrator for [employer], FNP.,have documented all relevant documentation on the behalf of Minette Brine, FNP,as directed by  Minette Brine, FNP while in the presence of Minette Brine, [place].  Subjective:     Patient ID: Emily Ross , female    DOB: [birth year] , 66 y.o.   MRN: [id number]   Chief Complaint  Patient presents with   Annual Exam    HPI  Patient presents today for annual physical.  Overall has been doing well. She had a hysterectomy May 17th - due to HPV - biopsy was done which was normal. She was going yearly for repeat PAPs.   Wt Readings from Last 3 Encounters: 05/17/22 : 159 lb (72.1 kg) 05/01/22 : 159 lb (72.1 kg) 02/13/22 : 159 lb (72.1 kg)      Hypertension This is a chronic problem. The current episode started more than 1 year ago. The problem is unchanged. The problem is controlled. Pertinent negatives include no anxiety, headaches or shortness of breath. There are no associated agents to hypertension. Risk factors for coronary artery disease include sedentary lifestyle and post-menopausal state (she is on chemo medication). Past treatments include angiotensin blockers. Compliance problems include exercise.  There is no history of angina. There is no history of chronic renal disease.     Past Medical History:  Diagnosis Date   Breast cancer ([place])    2009 left breast, 2020 right breast   Family history of breast cancer    Family history of stomach cancer    High cholesterol    History of radiation therapy 08/23/19- 09/21/19   Right breast 15 fx of 2.67 Gy to total 40.05 Gy. Right Breast boost 5 fx of 2 Gy each to total 10 Gy   History of seasonal allergies    Hypertension    Pneumonia    Pre-diabetes    Wears glasses    Wears partial dentures    upper     Family History  Problem Relation Age of Onset   Diabetes Father    Hypertension Father    Stomach cancer Father 13   Diabetes Mother    Hypertension Mother    Breast  cancer Sister 34       negative genetic testing   Breast cancer Maternal Aunt 80   Colon cancer Neg Hx    Esophageal cancer Neg Hx    Rectal cancer Neg Hx      Current Outpatient Medications:    acetaminophen (TYLENOL) 500 MG tablet, Take 2 tablets (1,000 mg total) by mouth every 8 (eight) hours as needed., Disp: 30 tablet, Rfl: 0   anastrozole (ARIMIDEX) 1 MG tablet, Take 1 tablet (1 mg total) by mouth daily., Disp: 90 tablet, Rfl: 3   Aspirin-Caffeine (BACK & BODY EXTRA STRENGTH) 500-32.5 MG TABS, Take 1 tablet by mouth every 6 (six) hours as needed (pain)., Disp: , Rfl:    carboxymethylcellul-glycerin (LUBRICATING EYE DROPS) 0.5-0.9 % ophthalmic solution, Place 1 drop into both eyes as needed (dry eyes)., Disp: 30 mL, Rfl: 1   Cholecalciferol (DIALYVITE VITAMIN D 5000) 125 MCG (5000 UT) capsule, Take 5,000 Units by mouth daily., Disp: , Rfl:    diphenhydrAMINE (BENADRYL) 25 MG tablet, Take 25 mg by mouth daily as needed for allergies., Disp: , Rfl:    famotidine (PEPCID) 10 MG tablet, Take 10 mg by mouth as needed., Disp: , Rfl:    hydrochlorothiazide (HYDRODIURIL) 25 MG tablet, Take 1 tablet (25 mg total) by  mouth daily., Disp: 90 tablet, Rfl: 1   ibuprofen (ADVIL) 600 MG tablet, Take 1 tablet (600 mg total) by mouth every 6 (six) hours as needed., Disp: 30 tablet, Rfl: 1   metoprolol tartrate (LOPRESSOR) 50 MG tablet, Take 1 tablet (50 mg total) by mouth 2 (two) times daily., Disp: 180 tablet, Rfl: 0   Red Yeast Rice Extract (RED YEAST RICE PO), Take 1 tablet by mouth at bedtime., Disp: , Rfl:    simvastatin (ZOCOR) 10 MG tablet, Take 1 tablet (10 mg total) by mouth at bedtime., Disp: 90 tablet, Rfl: 1   Allergies  Allergen Reactions   Tomato Swelling      The patient states she is status post hysterectomy.  Patient's last menstrual period was 12/03/2011.. Negative for Dysmenorrhea and Negative for Menorrhagia. Negative for: breast discharge, breast lump(s), breast pain and breast  self exam. Associated symptoms include abnormal vaginal bleeding. Pertinent negatives include abnormal bleeding (hematology), anxiety, decreased libido, depression, difficulty falling sleep, dyspareunia, history of infertility, nocturia, sexual dysfunction, sleep disturbances, urinary incontinence, urinary urgency, vaginal discharge and vaginal itching. Diet regular; she does not do any added sugars. Cakes and cookies are occasional. She does admit to eating "fresh fruit" in the summer. The patient states her exercise level is moderate with walking one day, walk/jog the next day and resistance training.   The patient's tobacco use is:  Social History   Tobacco Use  Smoking Status Never  Smokeless Tobacco Never   She has been exposed to passive smoke. The patient's alcohol use is:  Social History   Substance and Sexual Activity  Alcohol Use No   Additional information: Last pap 11/2021 per patient, next one scheduled for next year.    Review of Systems  Constitutional: Negative.   HENT: Negative.    Eyes: Negative.   Respiratory: Negative.  Negative for shortness of breath.   Cardiovascular: Negative.   Gastrointestinal: Negative.   Endocrine: Negative.   Genitourinary: Negative.   Musculoskeletal: Negative.   Skin: Negative.   Allergic/Immunologic: Negative.   Neurological: Negative.  Negative for headaches.  Hematological: Negative.   Psychiatric/Behavioral: Negative.       Today's Vitals   05/17/22 1011  BP: 138/70  Pulse: 65  Temp: 97.9 F (36.6 C)  TempSrc: Oral  Weight: 159 lb (72.1 kg)  Height: _0  (1.676 m)   Body mass index is 25.66 kg/m.  Wt Readings from Last 3 Encounters:  05/17/22 159 lb (72.1 kg)  05/01/22 159 lb (72.1 kg)  02/13/22 159 lb (72.1 kg)    Objective:  Physical Exam Vitals reviewed.  Constitutional:      General: She is not in acute distress.    Appearance: Normal appearance. She is well-developed.  HENT:     Head: Normocephalic and  atraumatic.     Right Ear: Hearing, tympanic membrane, ear canal and external ear normal. There is no impacted cerumen.     Left Ear: Hearing, tympanic membrane, ear canal and external ear normal. There is no impacted cerumen.     Nose:     Comments: Deferred - masked    Mouth/Throat:     Comments: Deferred - masked Eyes:     General: Lids are normal.     Extraocular Movements: Extraocular movements intact.     Conjunctiva/sclera: Conjunctivae normal.     Pupils: Pupils are equal, round, and reactive to light.     Funduscopic exam:    Right eye: No papilledema.  Left eye: No papilledema.  Neck:     Thyroid: No thyroid mass.     Vascular: No carotid bruit.  Cardiovascular:     Rate and Rhythm: Normal rate and regular rhythm.     Pulses: Normal pulses.     Heart sounds: Normal heart sounds. No murmur heard. Pulmonary:     Effort: Pulmonary effort is normal. No respiratory distress.     Breath sounds: Normal breath sounds. No wheezing.  Chest:  Breasts:    Tanner Score is 5.  Abdominal:     General: Abdomen is flat. Bowel sounds are normal. There is no distension.     Palpations: Abdomen is soft.     Tenderness: There is no abdominal tenderness.  Genitourinary:    Comments: Followed by GYN Musculoskeletal:        General: No swelling. Normal range of motion.     Cervical back: Full passive range of motion without pain, normal range of motion and neck supple.     Right lower leg: No edema.     Left lower leg: No edema.  Skin:    General: Skin is warm and dry.     Capillary Refill: Capillary refill takes less than 2 seconds.  Neurological:     General: No focal deficit present.     Mental Status: She is alert and oriented to person, place, and time.     Cranial Nerves: No cranial nerve deficit.     Sensory: No sensory deficit.  Psychiatric:        Mood and Affect: Mood normal.        Behavior: Behavior normal.        Thought Content: Thought content normal.         Judgment: Judgment normal.         Assessment And Plan:     1. Encounter for annual physical exam  2. Essential hypertension Comments: Blood pressure is controlled, continue current medications. EKG done with NSR HR 56 - POCT Urinalysis Dipstick (81002) - Microalbumin / Creatinine Urine Ratio - EKG 12-Lead  3. Elevated LDL cholesterol level Comments: Stable, diet controlled. Continue focusing on low fat diet and red yeast rice and statin, tolerating well. - CMP14+EGFR - Lipid panel  4. Prediabetes Comments: HgbA1c has been stable. Continue focusing on healthy diet low in sugar and starches. - Hemoglobin A1c  5. Other long term (current) drug therapy - CBC  6. Decreased estrogen level - DG Bone Density; Future   Patient was given opportunity to ask questions. Patient verbalized understanding of the plan and was able to repeat key elements of the plan. All questions were answered to their satisfaction.   Minette Brine, FNP    I, Minette Brine, FNP, have reviewed all documentation for this visit. The documentation on 05/17/22 for the exam, diagnosis, procedures, and orders are all accurate and complete.   THE PATIENT IS ENCOURAGED TO PRACTICE SOCIAL DISTANCING DUE TO THE COVID-19 PANDEMIC.

## 2022-05-17 NOTE — Patient Instructions (Signed)

## 2022-05-18 ENCOUNTER — Telehealth: Payer: Self-pay

## 2022-05-18 NOTE — Telephone Encounter (Signed)
WF [id number] Understanding and Predicting Breast Cancer Events after Treatment (UPBEAT)  Attempted to reach patient for yearly study phone call. No answer. Left message with contact info for research team.  Marjie Skiff. Jaidin Richison, RN, BSN, [place] [facility]  [facility]  [facility] Clinical Research Nurse [place] [phone]  Pager [phone] 05/18/2022 3:54 PM

## 2022-05-19 LAB — CMP14+EGFR
ALT: 11 IU/L (ref 0–32)
AST: 13 IU/L (ref 0–40)
Albumin/Globulin Ratio: 1.6 (ref 1.2–2.2)
Albumin: 4.6 g/dL (ref 3.9–4.9)
Alkaline Phosphatase: 81 IU/L (ref 44–121)
BUN/Creatinine Ratio: 13 (ref 12–28)
BUN: 10 mg/dL (ref 8–27)
Bilirubin Total: 0.4 mg/dL (ref 0.0–1.2)
CO2: 25 mmol/L (ref 20–29)
Calcium: 10.5 mg/dL — ABNORMAL HIGH (ref 8.7–10.3)
Chloride: 98 mmol/L (ref 96–106)
Creatinine, Ser: 0.8 mg/dL (ref 0.57–1.00)
Globulin, Total: 2.9 g/dL (ref 1.5–4.5)
Glucose: 103 mg/dL — ABNORMAL HIGH (ref 70–99)
Potassium: 4.6 mmol/L (ref 3.5–5.2)
Sodium: 138 mmol/L (ref 134–144)
Total Protein: 7.5 g/dL (ref 6.0–8.5)
eGFR: 81 mL/min/{1.73_m2} (ref >59)

## 2022-05-19 LAB — LIPID PANEL
Chol/HDL Ratio: 3 ratio (ref 0.0–4.4)
Cholesterol, Total: 175 mg/dL (ref 100–199)
HDL: 59 mg/dL (ref >39)
LDL Chol Calc (NIH): 94 mg/dL (ref 0–99)
Triglycerides: 127 mg/dL (ref 0–149)
VLDL Cholesterol Cal: 22 mg/dL (ref 5–40)

## 2022-05-19 LAB — MICROALBUMIN / CREATININE URINE RATIO
Creatinine, Urine: 68.5 mg/dL
Microalb/Creat Ratio: 5 mg/g creat (ref 0–29)
Microalbumin, Urine: 3.2 ug/mL

## 2022-05-19 LAB — CBC
Hematocrit: 38.1 % (ref 34.0–46.6)
Hemoglobin: 12.5 g/dL (ref 11.1–15.9)
MCH: 29.8 pg (ref 26.6–33.0)
MCHC: 32.8 g/dL (ref 31.5–35.7)
MCV: 91 fL (ref 79–97)
Platelets: 282 10*3/uL (ref 150–450)
RBC: 4.19 x10E6/uL (ref 3.77–5.28)
RDW: 12.5 % (ref 11.7–15.4)
WBC: 4.3 10*3/uL (ref 3.4–10.8)

## 2022-05-19 LAB — HEMOGLOBIN A1C
Est. average glucose Bld gHb Est-mCnc: 117 mg/dL
Hgb A1c MFr Bld: 5.7 % — ABNORMAL HIGH (ref 4.8–5.6)

## 2022-05-21 ENCOUNTER — Telehealth: Payer: Self-pay | Admitting: Oncology

## 2022-05-21 NOTE — Telephone Encounter (Signed)
05/21/22 - Upbeat study 3 year follow-up phone call.  Called and spoke to the patient over the phone.  Patient stated she had been in the hospital on Feb 13, 2022 for a total hysterectomy.  She has not had any cardiovascular problems.  She completed the year 3 PRO's over the phone with me.  I thanked the patient for her continued support in this clinical trial.  I told her she would receive a phone call next year for year 4. Remer Macho 05/21/22 11:25 am

## 2022-06-14 ENCOUNTER — Ambulatory Visit (INDEPENDENT_AMBULATORY_CARE_PROVIDER_SITE_OTHER): Payer: Medicare Other

## 2022-06-14 VITALS — BP 128/68 | HR 61 | Temp 98.4°F | Ht 66.0 in | Wt 159.0 lb

## 2022-06-14 DIAGNOSIS — Z23 Encounter for immunization: Secondary | ICD-10-CM | POA: Diagnosis not present

## 2022-06-14 NOTE — Progress Notes (Signed)
Patient presents today for a flu shot.  

## 2022-06-19 ENCOUNTER — Ambulatory Visit: Payer: Medicare Other

## 2022-06-28 ENCOUNTER — Ambulatory Visit (INDEPENDENT_AMBULATORY_CARE_PROVIDER_SITE_OTHER): Payer: Medicare Other

## 2022-06-28 VITALS — Ht 66.0 in | Wt 158.0 lb

## 2022-06-28 DIAGNOSIS — Z0000 Encounter for general adult medical examination without abnormal findings: Secondary | ICD-10-CM

## 2022-06-28 NOTE — Patient Instructions (Signed)
Emily Ross , Thank you for taking time to come for your Medicare Wellness Visit. I appreciate your ongoing commitment to your health goals. Please review the following plan we discussed and let me know if I can assist you in the future.   Screening recommendations/referrals: Colonoscopy: completed 12/03/2018, due 12/02/2028 Mammogram: completed 04/26/2022, due 04/28/2023 Bone Density: completed 12/30/2019 Recommended yearly ophthalmology/optometry visit for glaucoma screening and checkup Recommended yearly dental visit for hygiene and checkup  Vaccinations: Influenza vaccine: completed 06/14/2022 Pneumococcal vaccine: completed 12/18/2021 Tdap vaccine: completed 04/09/2018, due 04/09/2028 Shingles vaccine: completed   Covid-19: 06/28/2022, 07/12/2021, 03/11/2021, 07/06/2020, 12/26/2019, 12/05/2019  Advanced directives: Advance directive discussed with you today.   Conditions/risks identified: none  Next appointment: Follow up in one year for your annual wellness visit    Preventive Care 65 Years and Older, Female Preventive care refers to lifestyle choices and visits with your health care provider that can promote health and wellness. What does preventive care include? A yearly physical exam. This is also called an annual well check. Dental exams once or twice a year. Routine eye exams. Ask your health care provider how often you should have your eyes checked. Personal lifestyle choices, including: Daily care of your teeth and gums. Regular physical activity. Eating a healthy diet. Avoiding tobacco and drug use. Limiting alcohol use. Practicing safe sex. Taking low-dose aspirin every day. Taking vitamin and mineral supplements as recommended by your health care provider. What happens during an annual well check? The services and screenings done by your health care provider during your annual well check will depend on your age, overall health, lifestyle risk factors, and family history of  disease. Counseling  Your health care provider may ask you questions about your: Alcohol use. Tobacco use. Drug use. Emotional well-being. Home and relationship well-being. Sexual activity. Eating habits. History of falls. Memory and ability to understand (cognition). Work and work Statistician. Reproductive health. Screening  You may have the following tests or measurements: Height, weight, and BMI. Blood pressure. Lipid and cholesterol levels. These may be checked every 5 years, or more frequently if you are over 54 years old. Skin check. Lung cancer screening. You may have this screening every year starting at age 30 if you have a 30-pack-year history of smoking and currently smoke or have quit within the past 15 years. Fecal occult blood test (FOBT) of the stool. You may have this test every year starting at age 58. Flexible sigmoidoscopy or colonoscopy. You may have a sigmoidoscopy every 5 years or a colonoscopy every 10 years starting at age 44. Hepatitis C blood test. Hepatitis B blood test. Sexually transmitted disease (STD) testing. Diabetes screening. This is done by checking your blood sugar (glucose) after you have not eaten for a while (fasting). You may have this done every 1-3 years. Bone density scan. This is done to screen for osteoporosis. You may have this done starting at age 73. Mammogram. This may be done every 1-2 years. Talk to your health care provider about how often you should have regular mammograms. Talk with your health care provider about your test results, treatment options, and if necessary, the need for more tests. Vaccines  Your health care provider may recommend certain vaccines, such as: Influenza vaccine. This is recommended every year. Tetanus, diphtheria, and acellular pertussis (Tdap, Td) vaccine. You may need a Td booster every 10 years. Zoster vaccine. You may need this after age 12. Pneumococcal 13-valent conjugate (PCV13) vaccine. One  dose is recommended after  age 67. Pneumococcal polysaccharide (PPSV23) vaccine. One dose is recommended after age 77. Talk to your health care provider about which screenings and vaccines you need and how often you need them. This information is not intended to replace advice given to you by your health care provider. Make sure you discuss any questions you have with your health care provider. Document Released: 10/14/2015 Document Revised: 06/06/2016 Document Reviewed: 07/19/2015 Elsevier Interactive Patient Education  2017 [place] Prevention in the Home Falls can cause injuries. They can happen to people of all ages. There are many things you can do to make your home safe and to help prevent falls. What can I do on the outside of my home? Regularly fix the edges of walkways and driveways and fix any cracks. Remove anything that might make you trip as you walk through a door, such as a raised step or threshold. Trim any bushes or trees on the path to your home. Use bright outdoor lighting. Clear any walking paths of anything that might make someone trip, such as rocks or tools. Regularly check to see if handrails are loose or broken. Make sure that both sides of any steps have handrails. Any raised decks and porches should have guardrails on the edges. Have any leaves, snow, or ice cleared regularly. Use sand or salt on walking paths during winter. Clean up any spills in your garage right away. This includes oil or grease spills. What can I do in the bathroom? Use night lights. Install grab bars by the toilet and in the tub and shower. Do not use towel bars as grab bars. Use non-skid mats or decals in the tub or shower. If you need to sit down in the shower, use a plastic, non-slip stool. Keep the floor dry. Clean up any water that spills on the floor as soon as it happens. Remove soap buildup in the tub or shower regularly. Attach bath mats securely with double-sided  non-slip rug tape. Do not have throw rugs and other things on the floor that can make you trip. What can I do in the bedroom? Use night lights. Make sure that you have a light by your bed that is easy to reach. Do not use any sheets or blankets that are too big for your bed. They should not hang down onto the floor. Have a firm chair that has side arms. You can use this for support while you get dressed. Do not have throw rugs and other things on the floor that can make you trip. What can I do in the kitchen? Clean up any spills right away. Avoid walking on wet floors. Keep items that you use a lot in easy-to-reach places. If you need to reach something above you, use a strong step stool that has a grab bar. Keep electrical cords out of the way. Do not use floor polish or wax that makes floors slippery. If you must use wax, use non-skid floor wax. Do not have throw rugs and other things on the floor that can make you trip. What can I do with my stairs? Do not leave any items on the stairs. Make sure that there are handrails on both sides of the stairs and use them. Fix handrails that are broken or loose. Make sure that handrails are as long as the stairways. Check any carpeting to make sure that it is firmly attached to the stairs. Fix any carpet that is loose or worn. Avoid having throw rugs at  the top or bottom of the stairs. If you do have throw rugs, attach them to the floor with carpet tape. Make sure that you have a light switch at the top of the stairs and the bottom of the stairs. If you do not have them, ask someone to add them for you. What else can I do to help prevent falls? Wear shoes that: Do not have high heels. Have rubber bottoms. Are comfortable and fit you well. Are closed at the toe. Do not wear sandals. If you use a stepladder: Make sure that it is fully opened. Do not climb a closed stepladder. Make sure that both sides of the stepladder are locked into place. Ask  someone to hold it for you, if possible. Clearly mark and make sure that you can see: Any grab bars or handrails. First and last steps. Where the edge of each step is. Use tools that help you move around (mobility aids) if they are needed. These include: Canes. Walkers. Scooters. Crutches. Turn on the lights when you go into a dark area. Replace any light bulbs as soon as they burn out. Set up your furniture so you have a clear path. Avoid moving your furniture around. If any of your floors are uneven, fix them. If there are any pets around you, be aware of where they are. Review your medicines with your doctor. Some medicines can make you feel dizzy. This can increase your chance of falling. Ask your doctor what other things that you can do to help prevent falls. This information is not intended to replace advice given to you by your health care provider. Make sure you discuss any questions you have with your health care provider. Document Released: 07/14/2009 Document Revised: 02/23/2016 Document Reviewed: 10/22/2014 Elsevier Interactive Patient Education  2017 [employer].

## 2022-06-28 NOTE — Progress Notes (Signed)
I connected with Emily Ross today by telephone and verified that I am speaking with the correct person using two identifiers. Location patient: home Location provider: work Persons participating in the virtual visit: Emily, [place] LPN.   I discussed the limitations, risks, security and privacy concerns of performing an evaluation and management service by telephone and the availability of in person appointments. I also discussed with the patient that there may be a patient responsible charge related to this service. The patient expressed understanding and verbally consented to this telephonic visit.    Interactive audio and video telecommunications were attempted between this provider and patient, however failed, due to patient having technical difficulties OR patient did not have access to video capability.  We continued and completed visit with audio only.     Vital signs may be patient reported or missing.  Subjective:   Emily Ross is a 66 y.o. female who presents for Medicare Annual (Subsequent) preventive examination.  Review of Systems     Cardiac Risk Factors include: advanced age (>33mn, >>37women);hypertension     Objective:    Today's Vitals   06/28/22 1133  Weight: 158 lb (71.7 kg)  Height: '5\' 6"'$  (1.676 m)   Body mass index is 25.5 kg/m.     06/28/2022   11:37 AM 02/13/2022   12:22 PM 11/20/2021   10:41 AM 11/09/2021   12:06 PM 06/07/2021   11:54 AM 05/10/2020    3:40 PM 05/10/2020    2:31 PM  Advanced Directives  Does Patient Have a Medical Advance Directive? No No No No No No No  Would patient like information on creating a medical advance directive?  No - Patient declined No - Patient declined  No - Patient declined Yes (MAU/Ambulatory/Procedural Areas - Information given) No - Patient declined    Current Medications (verified) Outpatient Encounter Medications as of 06/28/2022  Medication Sig   acetaminophen (TYLENOL) 500 MG tablet Take 2  tablets (1,000 mg total) by mouth every 8 (eight) hours as needed.   anastrozole (ARIMIDEX) 1 MG tablet Take 1 tablet (1 mg total) by mouth daily.   Aspirin-Caffeine (BACK & BODY EXTRA STRENGTH) 500-32.5 MG TABS Take 1 tablet by mouth every 6 (six) hours as needed (pain).   carboxymethylcellul-glycerin (LUBRICATING EYE DROPS) 0.5-0.9 % ophthalmic solution Place 1 drop into both eyes as needed (dry eyes).   Cholecalciferol (DIALYVITE VITAMIN D 5000) 125 MCG (5000 UT) capsule Take 5,000 Units by mouth daily.   diphenhydrAMINE (BENADRYL) 25 MG tablet Take 25 mg by mouth daily as needed for allergies.   famotidine (PEPCID) 10 MG tablet Take 10 mg by mouth as needed.   hydrochlorothiazide (HYDRODIURIL) 25 MG tablet Take 1 tablet (25 mg total) by mouth daily.   metoprolol tartrate (LOPRESSOR) 50 MG tablet Take 1 tablet (50 mg total) by mouth 2 (two) times daily.   Red Yeast Rice Extract (RED YEAST RICE PO) Take 1 tablet by mouth at bedtime.   simvastatin (ZOCOR) 10 MG tablet Take 1 tablet (10 mg total) by mouth at bedtime.   [DISCONTINUED] ibuprofen (ADVIL) 600 MG tablet Take 1 tablet (600 mg total) by mouth every 6 (six) hours as needed.   No facility-administered encounter medications on file as of 06/28/2022.    Allergies (verified) Tomato   History: Past Medical History:  Diagnosis Date   Breast cancer ([facility]    2009 left breast, 2020 right breast   Family history of breast cancer    Family  history of stomach cancer    High cholesterol    History of radiation therapy 08/23/19- 09/21/19   Right breast 15 fx of 2.67 Gy to total 40.05 Gy. Right Breast boost 5 fx of 2 Gy each to total 10 Gy   History of seasonal allergies    Hypertension    Pneumonia    Pre-diabetes    Wears glasses    Wears partial dentures    upper   Past Surgical History:  Procedure Laterality Date   AXILLARY SENTINEL NODE BIOPSY Right 04/17/2019   Procedure: Right Axillary Sentinel Node Biopsy;  Surgeon: Rolm Bookbinder, MD;  Location: [place];  Service: General;  Laterality: Right;   BREAST LUMPECTOMY     BREAST LUMPECTOMY WITH RADIOACTIVE SEED AND SENTINEL LYMPH NODE BIOPSY Right 04/17/2019   Procedure: RIGHT BREAST LUMPECTOMY WITH RADIOACTIVE SEED;  Surgeon: Rolm Bookbinder, MD;  Location: [place];  Service: General;  Laterality: Right;   CERVICAL CONIZATION W/BX N/A 11/20/2021   Procedure: CONIZATION CERVIX WITH BIOPSY;  Surgeon: Christophe Louis, MD;  Location: [place];  Service: Gynecology;  Laterality: N/A;   COLONOSCOPY     PORTACATH PLACEMENT Right 04/17/2019   Procedure: INSERTION PORT-A-CATH WITH ULTRASOUND;  Surgeon: Rolm Bookbinder, MD;  Location: [place];  Service: General;  Laterality: Right;   ROBOTIC ASSISTED LAPAROSCOPIC HYSTERECTOMY AND SALPINGECTOMY Bilateral 02/13/2022   Procedure: TOTAL ABDOMINALHYSTERECTOMY AND SALPINGO-OOPHORECTOMY;  Surgeon: Christophe Louis, MD;  Location: [facility];  Service: Gynecology;  Laterality: Bilateral;   TUBAL LIGATION     WISDOM TOOTH EXTRACTION     Family History  Problem Relation Age of Onset   Diabetes Father    Hypertension Father    Stomach cancer Father 42   Diabetes Mother    Hypertension Mother    Breast cancer Sister 27       negative genetic testing   Breast cancer Maternal Aunt 80   Colon cancer Neg Hx    Esophageal cancer Neg Hx    Rectal cancer Neg Hx    Social History   Socioeconomic History   Marital status: Married    Spouse name: Not on file   Number of children: Not on file   Years of education: Not on file   Highest education level: Not on file  Occupational History   Not on file  Tobacco Use   Smoking status: Never   Smokeless tobacco: Never  Vaping Use   Vaping Use: Never used  Substance and Sexual Activity   Alcohol use: No   Drug use: No   Sexual activity: Yes    Birth control/protection: Post-menopausal, None    Comment: tubal ligation  Other Topics Concern   Not on file   Social History Narrative   ** Merged History Encounter **       Social Determinants of Health   Financial Resource Strain: Low Risk  (06/28/2022)   Overall Financial Resource Strain (CARDIA)    Difficulty of Paying Living Expenses: Not hard at all  Food Insecurity: No Food Insecurity (06/28/2022)   Hunger Vital Sign    Worried About Running Out of Food in the Last Year: Never true    Ran Out of Food in the Last Year: Never true  Transportation Needs: No Transportation Needs (06/28/2022)   PRAPARE - Hydrologist (Medical): No    Lack of Transportation (Non-Medical): No  Physical Activity: Insufficiently Active (06/28/2022)   Exercise Vital Sign  Days of Exercise per Week: 4 days    Minutes of Exercise per Session: 30 min  Stress: No Stress Concern Present (06/28/2022)   [place]    Feeling of Stress : Not at all  Social Connections: [place] (05/10/2020)   Social Connection and Isolation Panel [NHANES]    Frequency of Communication with Friends and Family: More than three times a week    Frequency of Social Gatherings with Friends and Family: Once a week    Attends Religious Services: More than 4 times per year    Active Member of [employer] or Organizations: Yes    Attends Music therapist: More than 4 times per year    Marital Status: Married    Tobacco Counseling Counseling given: Not Answered   Clinical Intake:  Pre-visit preparation completed: Yes  Pain : No/denies pain     Nutritional Status: BMI 25 -29 Overweight Nutritional Risks: None Diabetes: No  How often do you need to have someone help you when you read instructions, pamphlets, or other written materials from your doctor or pharmacy?: 1 - Never What is the last grade level you completed in school?: associates degree  Diabetic? no  Interpreter Needed?: No  Information entered by ::  NAllen LPN   Activities of Daily Living    06/28/2022   11:38 AM 02/13/2022    4:00 PM  In your present state of health, do you have any difficulty performing the following activities:  Hearing? 0 0  Vision? 0 0  Difficulty concentrating or making decisions? 0 0  Walking or climbing stairs? 0 0  Dressing or bathing? 0 0  Doing errands, shopping? 0 0  Preparing Food and eating ? N   Using the Toilet? N   In the past six months, have you accidently leaked urine? N   Do you have problems with loss of bowel control? N   Managing your Medications? N   Managing your Finances? N   Housekeeping or managing your Housekeeping? N     Patient Care Team: Minette Brine, FNP as PCP - General (General Practice) Nicholas Lose, MD as Consulting Physician (Hematology and Oncology) Rolm Bookbinder, MD as Consulting Physician (General Surgery) Eppie Gibson, MD as Attending Physician (Radiation Oncology)  Indicate any recent Medical Services you may have received from other than [facility] providers in the past year (date may be approximate).     Assessment:   This is a routine wellness examination for [place].  Hearing/Vision screen Vision Screening - Comments:: Regular eye exams, Dr. Caryl Pina, [facility]  Dietary issues and exercise activities discussed: Current Exercise Habits: Home exercise routine, Type of exercise: walking, Time (Minutes): 30, Frequency (Times/Week): 4, Weekly Exercise (Minutes/Week): 120   Goals Addressed             This Visit's Progress    Patient Stated       06/28/2022, wants to trim waist       Depression Screen    06/28/2022   11:38 AM 05/17/2022   10:00 AM 06/07/2021   11:55 AM 05/11/2021    2:42 PM 05/10/2020    2:34 PM 10/22/2019    2:20 PM 04/21/2019    2:50 PM  PHQ 2/9 Scores  PHQ - 2 Score 0 0 0 0 0 0 1  PHQ- 9 Score    0       Fall Risk    06/28/2022   11:38 AM 05/17/2022  10:00 AM 06/07/2021   11:55 AM 05/11/2021    2:42 PM 05/10/2020    3:42 PM   Fall Risk   Falls in the past year? 0 0 0 0 0  Number falls in past yr: 0 0  0 0  Injury with Fall? 0 0  0   Risk for fall due to : Medication side effect No Fall Risks Medication side effect    Follow up Falls prevention discussed;Education provided;Falls evaluation completed Falls evaluation completed Falls evaluation completed;Education provided;Falls prevention discussed      FALL RISK PREVENTION PERTAINING TO THE HOME:  Any stairs in or around the home? Yes  If so, are there any without handrails? No  Home free of loose throw rugs in walkways, pet beds, electrical cords, etc? Yes  Adequate lighting in your home to reduce risk of falls? Yes   ASSISTIVE DEVICES UTILIZED TO PREVENT FALLS:  Life alert? No  Use of a cane, walker or w/c? No  Grab bars in the bathroom? No  Shower chair or bench in shower? Yes  Elevated toilet seat or a handicapped toilet? Yes   TIMED UP AND GO:  Was the test performed? No .      Cognitive Function:        06/28/2022   11:39 AM 06/07/2021   11:56 AM 05/10/2020    2:27 PM  6CIT Screen  What Year? 0 points 0 points 0 points  What month? 0 points 0 points 0 points  What time? 0 points 0 points 0 points  Count back from 20 0 points 0 points 0 points  Months in reverse 0 points 0 points 0 points  Repeat phrase 2 points 4 points 0 points  Total Score 2 points 4 points 0 points    Immunizations Immunization History  Administered Date(s) Administered   Fluad Quad(high Dose 65+) 06/07/2021, 06/14/2022   Influenza, High Dose Seasonal PF 07/04/2020   Influenza,inj,Quad PF,6+ Mos 06/12/2019   PFIZER(Purple Top)SARS-COV-2 Vaccination 12/05/2019, 12/26/2019, 07/06/2020, 03/11/2021   PNEUMOCOCCAL CONJUGATE-20 12/18/2021   Pfizer Covid-19 Vaccine Bivalent Booster 81yr & up 07/12/2021   Pneumococcal Polysaccharide-23 11/15/2020   Tdap 05/22/2011, 04/09/2018   Zoster Recombinat (Shingrix) 08/02/2021, 11/28/2021    TDAP status: Up to  date  Flu Vaccine status: Up to date  Pneumococcal vaccine status: Up to date  Covid-19 vaccine status: Completed vaccines  Qualifies for Shingles Vaccine? Yes   Zostavax completed Yes   Shingrix Completed?: Yes  Screening Tests Health Maintenance  Topic Date Due   COVID-19 Vaccine (6 - Pfizer risk series) 09/06/2021   MAMMOGRAM  04/27/2023   TETANUS/TDAP  04/09/2028   COLONOSCOPY (Pts 45-498yrInsurance coverage will need to be confirmed)  12/02/2028   Pneumonia Vaccine 6568Years old  Completed   INFLUENZA VACCINE  Completed   DEXA SCAN  Completed   Hepatitis C Screening  Completed   Zoster Vaccines- Shingrix  Completed   HPV VACCINES  Aged Out    Health Maintenance  Health Maintenance Due  Topic Date Due   COVID-19 Vaccine (6 - Pfizer risk series) 09/06/2021    Colorectal cancer screening: Type of screening: Colonoscopy. Completed 12/03/2018. Repeat every 10 years  Mammogram status: Completed 04/26/2022. Repeat every year  Bone Density status: Completed 12/30/2019.   Lung Cancer Screening: (Low Dose CT Chest recommended if Age 67-80ears, 30 pack-year currently smoking OR have quit w/in 15years.) does not qualify.   Lung Cancer Screening Referral: no  Additional Screening:  Hepatitis  C Screening: does qualify; Completed 03/04/2017  Vision Screening: Recommended annual ophthalmology exams for early detection of glaucoma and other disorders of the eye. Is the patient up to date with their annual eye exam?  Yes  Who is the provider or what is the name of the office in which the patient attends annual eye exams? Dr. Caryl Pina If pt is not established with a provider, would they like to be referred to a provider to establish care? No .   Dental Screening: Recommended annual dental exams for proper oral hygiene  Community Resource Referral / Chronic Care Management: CRR required this visit?  No   CCM required this visit?  No      Plan:     I have personally  reviewed and noted the following in the patient's chart:   Medical and social history Use of alcohol, tobacco or illicit drugs  Current medications and supplements including opioid prescriptions. Patient is not currently taking opioid prescriptions. Functional ability and status Nutritional status Physical activity Advanced directives List of other physicians Hospitalizations, surgeries, and ER visits in previous 12 months Vitals Screenings to include cognitive, depression, and falls Referrals and appointments  In addition, I have reviewed and discussed with patient certain preventive protocols, quality metrics, and best practice recommendations. A written personalized care plan for preventive services as well as general preventive health recommendations were provided to patient.     Kellie Simmering, LPN   3/72/9021   Nurse Notes: none  Due to this being a virtual visit, the after visit summary with patients personalized plan was offered to patient via mail or my-chart. Patient would like to access on my-chart

## 2022-07-12 ENCOUNTER — Other Ambulatory Visit: Payer: Self-pay | Admitting: Nurse Practitioner

## 2022-07-12 DIAGNOSIS — I10 Essential (primary) hypertension: Secondary | ICD-10-CM

## 2022-08-27 ENCOUNTER — Other Ambulatory Visit: Payer: Self-pay | Admitting: Obstetrics and Gynecology

## 2022-08-27 ENCOUNTER — Other Ambulatory Visit (HOSPITAL_COMMUNITY)
Admission: RE | Admit: 2022-08-27 | Discharge: 2022-08-27 | Disposition: A | Discharge status: Home / Self Care | Payer: Medicare Other | Source: Ambulatory Visit | Attending: Obstetrics and Gynecology | Admitting: Obstetrics and Gynecology

## 2022-08-27 DIAGNOSIS — B977 Papillomavirus as the cause of diseases classified elsewhere: Secondary | ICD-10-CM | POA: Diagnosis present

## 2022-08-27 DIAGNOSIS — Z01419 Encounter for gynecological examination (general) (routine) without abnormal findings: Secondary | ICD-10-CM | POA: Diagnosis present

## 2022-08-29 LAB — CYTOLOGY - PAP
Comment: NEGATIVE
Diagnosis: NEGATIVE
High risk HPV: NEGATIVE

## 2022-09-18 ENCOUNTER — Ambulatory Visit: Payer: Medicare Other | Admitting: Nurse Practitioner

## 2022-10-21 ENCOUNTER — Other Ambulatory Visit: Payer: Self-pay | Admitting: Nurse Practitioner

## 2022-10-21 DIAGNOSIS — I10 Essential (primary) hypertension: Secondary | ICD-10-CM

## 2022-10-21 DIAGNOSIS — E7800 Pure hypercholesterolemia, unspecified: Secondary | ICD-10-CM

## 2022-10-24 ENCOUNTER — Ambulatory Visit (INDEPENDENT_AMBULATORY_CARE_PROVIDER_SITE_OTHER): Payer: Medicare Other | Admitting: Nurse Practitioner

## 2022-10-24 ENCOUNTER — Encounter: Payer: Self-pay | Admitting: Nurse Practitioner

## 2022-10-24 VITALS — BP 128/70 | HR 67 | Temp 98.2°F | Ht 66.0 in | Wt 160.0 lb

## 2022-10-24 DIAGNOSIS — I10 Essential (primary) hypertension: Secondary | ICD-10-CM | POA: Diagnosis not present

## 2022-10-24 DIAGNOSIS — E7800 Pure hypercholesterolemia, unspecified: Secondary | ICD-10-CM | POA: Diagnosis not present

## 2022-10-24 DIAGNOSIS — C50411 Malignant neoplasm of upper-outer quadrant of right female breast: Secondary | ICD-10-CM

## 2022-10-24 DIAGNOSIS — R7303 Prediabetes: Secondary | ICD-10-CM | POA: Diagnosis not present

## 2022-10-24 DIAGNOSIS — Z170 Estrogen receptor positive status [ER+]: Secondary | ICD-10-CM

## 2022-10-24 DIAGNOSIS — Z91018 Allergy to other foods: Secondary | ICD-10-CM

## 2022-10-24 MED ORDER — EPINEPHRINE 0.3 MG/0.3ML IJ SOAJ: 0.3000 mg | INTRAMUSCULAR | 1 refills | Status: AC | PRN

## 2022-10-24 MED ORDER — METOPROLOL TARTRATE 50 MG PO TABS: 50.0000 mg | ORAL_TABLET | Freq: Two times a day (BID) | ORAL | 1 refills | Status: DC

## 2022-10-24 NOTE — Progress Notes (Signed)
I,Tianna Badgett,acting as a Education administrator for [employer], FNP.,have documented all relevant documentation on the behalf of Minette Brine, FNP,as directed by  Minette Brine, FNP while in the presence of Minette Brine, [place].  Subjective:     Patient ID: Emily Ross , female    DOB: 1956/09/09 , 67 y.o.   MRN: [id number]   Chief Complaint  Patient presents with   Hypertension    HPI  Patient presents today for a blood pressure and cholesterol f/u.  She is taking statin and doing well. She reports pulling a muscle last week - she took ibuprofen which was effective.   The 10-year ASCVD risk score (Arnett DK, et al., 2019) is: 9%   Values used to calculate the score:     Age: 57 years     Sex: Female     Is Non-Hispanic African American: Yes     Diabetic: No     Tobacco smoker: No     Systolic Blood Pressure: 765 mmHg     Is BP treated: Yes     HDL Cholesterol: 59 mg/dL     Total Cholesterol: 175 mg/dL   Hypertension This is a chronic problem. The current episode started more than 1 year ago. The problem is unchanged. The problem is controlled. Pertinent negatives include no anxiety, chest pain, headaches, palpitations or shortness of breath. There are no associated agents to hypertension. Risk factors for coronary artery disease include sedentary lifestyle and post-menopausal state (she is on chemo medication). Past treatments include angiotensin blockers. Compliance problems include exercise.  There is no history of angina. There is no history of chronic renal disease.  Hyperlipidemia This is a chronic problem. The current episode started more than 1 year ago. The problem is controlled. Recent lipid tests were reviewed and are normal (LDL slightly elevated). She has no history of chronic renal disease. Pertinent negatives include no chest pain or shortness of breath. Current antihyperlipidemic treatment includes exercise. The current treatment provides mild improvement of lipids. There are no  compliance problems.      Past Medical History:  Diagnosis Date   Breast cancer ([place])    2009 left breast, 2020 right breast   Family history of breast cancer    Family history of stomach cancer    High cholesterol    History of radiation therapy 08/23/19- 09/21/19   Right breast 15 fx of 2.67 Gy to total 40.05 Gy. Right Breast boost 5 fx of 2 Gy each to total 10 Gy   History of seasonal allergies    Hypertension    Pneumonia    Pre-diabetes    Wears glasses    Wears partial dentures    upper     Family History  Problem Relation Age of Onset   Diabetes Father    Hypertension Father    Stomach cancer Father 34   Diabetes Mother    Hypertension Mother    Breast cancer Sister 65       negative genetic testing   Breast cancer Maternal Aunt 80   Colon cancer Neg Hx    Esophageal cancer Neg Hx    Rectal cancer Neg Hx      Current Outpatient Medications:    EPINEPHrine (EPIPEN 2-PAK) 0.3 mg/0.3 mL IJ SOAJ injection, Inject 0.3 mg into the muscle as needed for anaphylaxis., Disp: 2 each, Rfl: 1   acetaminophen (TYLENOL) 500 MG tablet, Take 2 tablets (1,000 mg total) by mouth every 8 (eight) hours as needed.,  Disp: 30 tablet, Rfl: 0   anastrozole (ARIMIDEX) 1 MG tablet, Take 1 tablet (1 mg total) by mouth daily., Disp: 90 tablet, Rfl: 3   Aspirin-Caffeine (BACK & BODY EXTRA STRENGTH) 500-32.5 MG TABS, Take 1 tablet by mouth every 6 (six) hours as needed (pain)., Disp: , Rfl:    carboxymethylcellul-glycerin (LUBRICATING EYE DROPS) 0.5-0.9 % ophthalmic solution, Place 1 drop into both eyes as needed (dry eyes)., Disp: 30 mL, Rfl: 6   Cholecalciferol (DIALYVITE VITAMIN D 5000) 125 MCG (5000 UT) capsule, Take 5,000 Units by mouth daily., Disp: , Rfl:    diphenhydrAMINE (BENADRYL ALLERGY) 25 mg capsule, 1 tablet at bedtime as needed Orally as needed, Disp: , Rfl:    diphenhydrAMINE (BENADRYL) 25 MG tablet, Take 25 mg by mouth daily as needed for allergies., Disp: , Rfl:    famotidine  (PEPCID) 10 MG tablet, Take 10 mg by mouth as needed., Disp: , Rfl:    hydrochlorothiazide (HYDRODIURIL) 25 MG tablet, TAKE ONE TABLET BY MOUTH ONE TIME DAILY, Disp: 90 tablet, Rfl: 0   metoprolol tartrate (LOPRESSOR) 50 MG tablet, Take 1 tablet (50 mg total) by mouth 2 (two) times daily., Disp: 180 tablet, Rfl: 1   Multiple Vitamin (M.V.I. ADULT IV), MVI, Disp: , Rfl:    Red Yeast Rice Extract (RED YEAST RICE PO), Take 1 tablet by mouth at bedtime., Disp: , Rfl:    simvastatin (ZOCOR) 10 MG tablet, TAKE ONE TABLET BY MOUTH DAILY AT BEDTIME, Disp: 90 tablet, Rfl: 0   Allergies  Allergen Reactions   Tomato Swelling     Review of Systems  Constitutional: Negative.   Respiratory: Negative.  Negative for shortness of breath.   Cardiovascular: Negative.  Negative for chest pain and palpitations.  Gastrointestinal: Negative.   Neurological: Negative.  Negative for headaches.  Psychiatric/Behavioral: Negative.       Today's Vitals   10/24/22 1133  BP: 128/70  Pulse: 67  Temp: 98.2 F (36.8 C)  TempSrc: Oral  Weight: 160 lb (72.6 kg)  Height: '5\' 6"'$  (1.676 m)   Body mass index is 25.82 kg/m.   Objective:  Physical Exam Vitals reviewed.  Constitutional:      General: She is not in acute distress.    Appearance: Normal appearance.  Cardiovascular:     Rate and Rhythm: Normal rate and regular rhythm.     Pulses: Normal pulses.     Heart sounds: Normal heart sounds. No murmur heard. Pulmonary:     Effort: Pulmonary effort is normal. No respiratory distress.     Breath sounds: Normal breath sounds. No wheezing.  Skin:    General: Skin is warm and dry.     Capillary Refill: Capillary refill takes less than 2 seconds.  Neurological:     General: No focal deficit present.     Mental Status: She is alert and oriented to person, place, and time.     Cranial Nerves: No cranial nerve deficit.     Motor: No weakness.  Psychiatric:        Mood and Affect: Mood normal.         Behavior: Behavior normal.        Thought Content: Thought content normal.        Judgment: Judgment normal.         Assessment And Plan:     1. Essential hypertension Comments: Blood pressure is well controlled, continue current medications - BMP8+EGFR - metoprolol tartrate (LOPRESSOR) 50 MG tablet; Take 1 tablet (  50 mg total) by mouth 2 (two) times daily.  Dispense: 180 tablet; Refill: 1  2. Prediabetes Comments: Stable, continue focusing on healthy diet and regular exercise - Hemoglobin A1c  3. Elevated LDL cholesterol level Comments: Continue statin, tolerating well - Lipid panel  4. Malignant neoplasm of upper-outer quadrant of right breast in female, estrogen receptor positive ([place]) Comments: Continue f/u with Oncology and oral medications  5. Personal history of tomato allergy - EPINEPHrine (EPIPEN 2-PAK) 0.3 mg/0.3 mL IJ SOAJ injection; Inject 0.3 mg into the muscle as needed for anaphylaxis.  Dispense: 2 each; Refill: 1     Patient was given opportunity to ask questions. Patient verbalized understanding of the plan and was able to repeat key elements of the plan. All questions were answered to their satisfaction.  Minette Brine, FNP   I, Minette Brine, FNP, have reviewed all documentation for this visit. The documentation on 10/24/22 for the exam, diagnosis, procedures, and orders are all accurate and complete.   IF YOU HAVE BEEN REFERRED TO A SPECIALIST, IT MAY TAKE 1-2 WEEKS TO SCHEDULE/PROCESS THE REFERRAL. IF YOU HAVE NOT HEARD FROM US/SPECIALIST IN TWO WEEKS, PLEASE GIVE [place] A CALL AT [phone].   THE PATIENT IS ENCOURAGED TO PRACTICE SOCIAL DISTANCING DUE TO THE COVID-19 PANDEMIC.

## 2022-10-24 NOTE — Patient Instructions (Signed)
Hypertension, Adult High blood pressure (hypertension) is when the force of blood pumping through the arteries is too strong. The arteries are the blood vessels that carry blood from the heart throughout the body. Hypertension forces the heart to work harder to pump blood and may cause arteries to become narrow or stiff. Untreated or uncontrolled hypertension can lead to a heart attack, heart failure, a stroke, kidney disease, and other problems. A blood pressure reading consists of a higher number over a lower number. Ideally, your blood pressure should be below 120/80. The first ("top") number is called the systolic pressure. It is a measure of the pressure in your arteries as your heart beats. The second ("bottom") number is called the diastolic pressure. It is a measure of the pressure in your arteries as the heart relaxes. What are the causes? The exact cause of this condition is not known. There are some conditions that result in high blood pressure. What increases the risk? Certain factors may make you more likely to develop high blood pressure. Some of these risk factors are under your control, including: Smoking. Not getting enough exercise or physical activity. Being overweight. Having too much fat, sugar, calories, or salt (sodium) in your diet. Drinking too much alcohol. Other risk factors include: Having a personal history of heart disease, diabetes, high cholesterol, or kidney disease. Stress. Having a family history of high blood pressure and high cholesterol. Having obstructive sleep apnea. Age. The risk increases with age. What are the signs or symptoms? High blood pressure may not cause symptoms. Very high blood pressure (hypertensive crisis) may cause: Headache. Fast or irregular heartbeats (palpitations). Shortness of breath. Nosebleed. Nausea and vomiting. Vision changes. Severe chest pain, dizziness, and seizures. How is this diagnosed? This condition is diagnosed by  measuring your blood pressure while you are seated, with your arm resting on a flat surface, your legs uncrossed, and your feet flat on the floor. The cuff of the blood pressure monitor will be placed directly against the skin of your upper arm at the level of your heart. Blood pressure should be measured at least twice using the same arm. Certain conditions can cause a difference in blood pressure between your right and left arms. If you have a high blood pressure reading during one visit or you have normal blood pressure with other risk factors, you may be asked to: Return on a different day to have your blood pressure checked again. Monitor your blood pressure at home for 1 week or longer. If you are diagnosed with hypertension, you may have other blood or imaging tests to help your health care provider understand your overall risk for other conditions. How is this treated? This condition is treated by making healthy lifestyle changes, such as eating healthy foods, exercising more, and reducing your alcohol intake. You may be referred for counseling on a healthy diet and physical activity. Your health care provider may prescribe medicine if lifestyle changes are not enough to get your blood pressure under control and if: Your systolic blood pressure is above 130. Your diastolic blood pressure is above 80. Your personal target blood pressure may vary depending on your medical conditions, your age, and other factors. Follow these instructions at home: Eating and drinking  Eat a diet that is high in fiber and potassium, and low in sodium, added sugar, and fat. An example of this eating plan is called the DASH diet. DASH stands for Dietary Approaches to Stop Hypertension. To eat this way: Eat   plenty of fresh fruits and vegetables. Try to fill one half of your plate at each meal with fruits and vegetables. Eat whole grains, such as whole-wheat pasta, brown rice, or whole-grain bread. Fill about one  fourth of your plate with whole grains. Eat or drink low-fat dairy products, such as skim milk or low-fat yogurt. Avoid fatty cuts of meat, processed or cured meats, and poultry with skin. Fill about one fourth of your plate with lean proteins, such as fish, chicken without skin, beans, eggs, or tofu. Avoid pre-made and processed foods. These tend to be higher in sodium, added sugar, and fat. Reduce your daily sodium intake. Many people with hypertension should eat less than 1,500 mg of sodium a day. Do not drink alcohol if: Your health care provider tells you not to drink. You are pregnant, may be pregnant, or are planning to become pregnant. If you drink alcohol: Limit how much you have to: 0-1 drink a day for women. 0-2 drinks a day for men. Know how much alcohol is in your drink. In the [place] one drink equals one 12 oz bottle of beer (355 mL), one 5 oz glass of wine (148 mL), or one 1 oz glass of hard liquor (44 mL). Lifestyle  Work with your health care provider to maintain a healthy body weight or to lose weight. Ask what an ideal weight is for you. Get at least 30 minutes of exercise that causes your heart to beat faster (aerobic exercise) most days of the week. Activities may include walking, swimming, or biking. Include exercise to strengthen your muscles (resistance exercise), such as Pilates or lifting weights, as part of your weekly exercise routine. Try to do these types of exercises for 30 minutes at least 3 days a week. Do not use any products that contain nicotine or tobacco. These products include cigarettes, chewing tobacco, and vaping devices, such as e-cigarettes. If you need help quitting, ask your health care provider. Monitor your blood pressure at home as told by your health care provider. Keep all follow-up visits. This is important. Medicines Take over-the-counter and prescription medicines only as told by your health care provider. Follow directions carefully. Blood  pressure medicines must be taken as prescribed. Do not skip doses of blood pressure medicine. Doing this puts you at risk for problems and can make the medicine less effective. Ask your health care provider about side effects or reactions to medicines that you should watch for. Contact a health care provider if you: Think you are having a reaction to a medicine you are taking. Have headaches that keep coming back (recurring). Feel dizzy. Have swelling in your ankles. Have trouble with your vision. Get help right away if you: Develop a severe headache or confusion. Have unusual weakness or numbness. Feel faint. Have severe pain in your chest or abdomen. Vomit repeatedly. Have trouble breathing. These symptoms may be an emergency. Get help right away. Call 911. Do not wait to see if the symptoms will go away. Do not drive yourself to the hospital. Summary Hypertension is when the force of blood pumping through your arteries is too strong. If this condition is not controlled, it may put you at risk for serious complications. Your personal target blood pressure may vary depending on your medical conditions, your age, and other factors. For most people, a normal blood pressure is less than 120/80. Hypertension is treated with lifestyle changes, medicines, or a combination of both. Lifestyle changes include losing weight, eating a healthy,   low-sodium diet, exercising more, and limiting alcohol. This information is not intended to replace advice given to you by your health care provider. Make sure you discuss any questions you have with your health care provider. Document Revised: 07/25/2021 Document Reviewed: 07/25/2021 Elsevier Patient Education  2023 Elsevier Inc.  

## 2022-10-25 LAB — HEMOGLOBIN A1C
Est. average glucose Bld gHb Est-mCnc: 120 mg/dL
Hgb A1c MFr Bld: 5.8 % — ABNORMAL HIGH (ref 4.8–5.6)

## 2022-10-25 LAB — LIPID PANEL
Chol/HDL Ratio: 2.7 ratio (ref 0.0–4.4)
Cholesterol, Total: 181 mg/dL (ref 100–199)
HDL: 66 mg/dL (ref >39)
LDL Chol Calc (NIH): 103 mg/dL — ABNORMAL HIGH (ref 0–99)
Triglycerides: 64 mg/dL (ref 0–149)
VLDL Cholesterol Cal: 12 mg/dL (ref 5–40)

## 2022-10-25 LAB — BMP8+EGFR
BUN/Creatinine Ratio: 18 (ref 12–28)
BUN: 15 mg/dL (ref 8–27)
CO2: 28 mmol/L (ref 20–29)
Calcium: 10.7 mg/dL — ABNORMAL HIGH (ref 8.7–10.3)
Chloride: 102 mmol/L (ref 96–106)
Creatinine, Ser: 0.84 mg/dL (ref 0.57–1.00)
Glucose: 100 mg/dL — ABNORMAL HIGH (ref 70–99)
Potassium: 4.3 mmol/L (ref 3.5–5.2)
Sodium: 144 mmol/L (ref 134–144)
eGFR: 76 mL/min/{1.73_m2} (ref >59)

## 2022-10-28 NOTE — Assessment & Plan Note (Signed)
04/02/2019:Screening mammogram detected a mass in the right axilla. [place] confirmed a 0.9cm right breast mass. Biopsy showed IDC, grade 2, HER-2+ (3+), ER+ 100%, PR+ 5%, Ki67 15%.    Treatment plan: 1.  Breast conserving surgery with sentinel lymph node biopsy 2.  Adjuvant chemotherapy with Taxol Herceptin weekly x12 followed by Herceptin maintenance for 1 year started 05/15/2019 completed July 2021 3.  Adjuvant radiation therapy will be starting 08/22/2019-09/21/2019 4.  Followed by adjuvant antiestrogen therapy with anastrozole 1 mg daily x5 to 7 years started 11/10/2019 ------------------------------------------------------------------------------------------------------------------------------------------- Current Treatment: Anastrozole started 11/10/2019   Breast cancer surveillance: 1. Mamm[facility]/22 at [facility]: Benign, breast density category A 2. Breast exam 10/29/2022: Benign   Anastrozole toxicities: Denies any hot flashes or myalgias. Return to clinic in 1 year for follow-up

## 2022-10-29 ENCOUNTER — Other Ambulatory Visit: Payer: Self-pay

## 2022-10-29 ENCOUNTER — Inpatient Hospital Stay: Payer: Medicare Other | Attending: Hematology and Oncology | Admitting: Hematology and Oncology

## 2022-10-29 VITALS — BP 163/93 | HR 65 | Temp 98.1°F | Resp 13 | Wt 160.7 lb

## 2022-10-29 DIAGNOSIS — Z79899 Other long term (current) drug therapy: Secondary | ICD-10-CM | POA: Insufficient documentation

## 2022-10-29 DIAGNOSIS — Z79811 Long term (current) use of aromatase inhibitors: Secondary | ICD-10-CM | POA: Diagnosis not present

## 2022-10-29 DIAGNOSIS — Z170 Estrogen receptor positive status [ER+]: Secondary | ICD-10-CM

## 2022-10-29 DIAGNOSIS — C50411 Malignant neoplasm of upper-outer quadrant of right female breast: Secondary | ICD-10-CM | POA: Diagnosis not present

## 2022-10-29 DIAGNOSIS — Z923 Personal history of irradiation: Secondary | ICD-10-CM | POA: Diagnosis not present

## 2022-10-29 MED ORDER — ANASTROZOLE 1 MG PO TABS: 1.0000 mg | ORAL_TABLET | Freq: Every day | ORAL | 3 refills | Status: DC

## 2022-10-29 MED ORDER — LUBRICATING EYE DROPS 0.5-0.9 % OP SOLN: 1.0000 [drp] | OPHTHALMIC | 6 refills | Status: DC | PRN

## 2022-10-29 NOTE — Progress Notes (Signed)
Patient Care Team: Minette Brine, FNP as PCP - General (General Practice) Nicholas Lose, MD as Consulting Physician (Hematology and Oncology) Rolm Bookbinder, MD as Consulting Physician (General Surgery) Eppie Gibson, MD as Attending Physician (Radiation Oncology)  DIAGNOSIS:  Encounter Diagnosis  Name Primary?   Malignant neoplasm of upper-outer quadrant of right breast in female, estrogen receptor positive ([place]) Yes    SUMMARY OF ONCOLOGIC HISTORY: Oncology History  Malignant neoplasm of upper-outer quadrant of right breast in female, estrogen receptor positive ([place])  04/02/2019 Initial Diagnosis   Screening mammogram detected a mass in the right axilla. [place] confirmed a 0.9cm right breast mass. Biopsy showed IDC, grade 2, HER-2+ (3+), ER+ 100%, PR+ 5%, Ki67 15%.    04/08/2019 Cancer Staging   Staging form: Breast, AJCC 8th Edition - Clinical stage from 04/08/2019: Stage IA (cT1b, cN0, cM0, G2, ER+, PR+, HER2+) - Signed by Nicholas Lose, MD on 04/08/2019   04/14/2019 Genetic Testing   Patient had genetic testing done for her personal and family history of breast cancer.  Negative genetic testing on the [facility] Breast Cancer STAT panel. The STAT Breast cancer panel offered by [facility] includes sequencing and rearrangement analysis for the following 9 genes:  ATM, BRCA1, BRCA2, CDH1, CHEK2, PALB2, PTEN, STK11 and TP53.  The report date is 04/14/2019.   04/17/2019 Surgery   Right lumpectomy Emily Ross): IDC, 2.0cm, grade 2, clear margins, 5 axillary lymph nodes negative for carcinoma   08/24/2019 - 09/21/2019 Radiation Therapy   Adjuvant radiation   10/01/2019 -  Anti-estrogen oral therapy   Anastrozole daily   Carcinoma of upper-inner quadrant of right breast in female, estrogen receptor positive ([place])  04/08/2019 Cancer Staging   Staging form: Breast, AJCC 8th Edition - Clinical stage from 04/08/2019: Stage IA (cT1b, cN0, cM0, G2, ER+, PR+, HER2+) - Signed by Eppie Gibson, MD on  04/08/2019   05/15/2019 - 04/29/2020 Chemotherapy   Patient is on Treatment Plan : BREAST weekly PACLitaxel / trastuzumab / Maintenance trastuzumab every 21 days       CHIEF COMPLIANT: Follow-up of right breast cancer on anastrozole   INTERVAL HISTORY: Emily Ross is a 67 y.o. with above-mentioned history of breast cancer who underwent a lumpectomy, adjuvant chemotherapy, radiation, Herceptin maintenance, and is currently on antiestrogen therapy with anastrozole. She is a participant in the [employer] clinical trial. She presents to the clinic today for follow-up. She reports that she has been doing good. She denies any hot flashes, joint stiffness and any pain or discomfort in breast.   ALLERGIES:  is allergic to tomato.  MEDICATIONS:  Current Outpatient Medications  Medication Sig Dispense Refill   acetaminophen (TYLENOL) 500 MG tablet Take 2 tablets (1,000 mg total) by mouth every 8 (eight) hours as needed. 30 tablet 0   anastrozole (ARIMIDEX) 1 MG tablet Take 1 tablet (1 mg total) by mouth daily. 90 tablet 3   Aspirin-Caffeine (BACK & BODY EXTRA STRENGTH) 500-32.5 MG TABS Take 1 tablet by mouth every 6 (six) hours as needed (pain).     carboxymethylcellul-glycerin (LUBRICATING EYE DROPS) 0.5-0.9 % ophthalmic solution Place 1 drop into both eyes as needed (dry eyes). 30 mL 6   Cholecalciferol (DIALYVITE VITAMIN D 5000) 125 MCG (5000 UT) capsule Take 5,000 Units by mouth daily.     diphenhydrAMINE (BENADRYL ALLERGY) 25 mg capsule 1 tablet at bedtime as needed Orally as needed     diphenhydrAMINE (BENADRYL) 25 MG tablet Take 25 mg by mouth daily as needed for allergies.  EPINEPHrine (EPIPEN 2-PAK) 0.3 mg/0.3 mL IJ SOAJ injection Inject 0.3 mg into the muscle as needed for anaphylaxis. 2 each 1   famotidine (PEPCID) 10 MG tablet Take 10 mg by mouth as needed.     hydrochlorothiazide (HYDRODIURIL) 25 MG tablet TAKE ONE TABLET BY MOUTH ONE TIME DAILY 90 tablet 0   metoprolol tartrate  (LOPRESSOR) 50 MG tablet Take 1 tablet (50 mg total) by mouth 2 (two) times daily. 180 tablet 1   Multiple Vitamin (M.V.I. ADULT IV) MVI     Red Yeast Rice Extract (RED YEAST RICE PO) Take 1 tablet by mouth at bedtime.     simvastatin (ZOCOR) 10 MG tablet TAKE ONE TABLET BY MOUTH DAILY AT BEDTIME 90 tablet 0   No current facility-administered medications for this visit.    PHYSICAL EXAMINATION: ECOG PERFORMANCE STATUS: 1 - Symptomatic but completely ambulatory  Vitals:   10/29/22 1025  BP: (!) 163/93  Pulse: 65  Resp: 13  Temp: 98.1 F (36.7 C)  SpO2: 100%   Filed Weights   10/29/22 1025  Weight: 160 lb 11.2 oz (72.9 kg)    BREAST: No palpable masses or nodules in either right or left breasts. No palpable axillary supraclavicular or infraclavicular adenopathy no breast tenderness or nipple discharge. (exam performed in the presence of a chaperone)  LABORATORY DATA:  I have reviewed the data as listed    Latest Ref Rng & Units 10/24/2022   12:33 PM 05/17/2022   11:15 AM 02/09/2022    9:00 AM  CMP  Glucose 70 - 99 mg/dL 100  103  90   BUN 8 - 27 mg/dL '15  10  17   '$ Creatinine 0.57 - 1.00 mg/dL 0.84  0.80  0.81   Sodium 134 - 144 mmol/L 144  138  137   Potassium 3.5 - 5.2 mmol/L 4.3  4.6  3.9   Chloride 96 - 106 mmol/L 102  98  101   CO2 20 - 29 mmol/L '28  25  29   '$ Calcium 8.7 - 10.3 mg/dL 10.7  10.5  9.7   Total Protein 6.0 - 8.5 g/dL  7.5    Total Bilirubin 0.0 - 1.2 mg/dL  0.4    Alkaline Phos 44 - 121 IU/L  81    AST 0 - 40 IU/L  13    ALT 0 - 32 IU/L  11      Lab Results  Component Value Date   WBC 4.3 05/17/2022   HGB 12.5 05/17/2022   HCT 38.1 05/17/2022   MCV 91 05/17/2022   PLT 282 05/17/2022   NEUTROABS 1.6 (L) 04/29/2020    ASSESSMENT & PLAN:  Malignant neoplasm of upper-outer quadrant of right breast in female, estrogen receptor positive ([place]) 04/02/2019:Screening mammogram detected a mass in the right axilla. [place] confirmed a 0.9cm right breast mass.  Biopsy showed IDC, grade 2, HER-2+ (3+), ER+ 100%, PR+ 5%, Ki67 15%.    Treatment plan: 1.  Breast conserving surgery with sentinel lymph node biopsy 2.  Adjuvant chemotherapy with Taxol Herceptin weekly x12 followed by Herceptin maintenance for 1 year started 05/15/2019 completed July 2021 3.  Adjuvant radiation therapy will be starting 08/22/2019-09/21/2019 4.  Followed by adjuvant antiestrogen therapy with anastrozole 1 mg daily x5 to 7 years started 11/10/2019 ------------------------------------------------------------------------------------------------------------------------------------------- Current Treatment: Anastrozole started 11/10/2019   Breast cancer surveillance: 1. Mammogram 04/06/22 at [facility]: Benign, breast density category A 2. Breast exam 10/29/2022: Benign   Anastrozole toxicities: Denies any hot  flashes or myalgias.  Return to clinic in 1 year for follow-up      No orders of the defined types were placed in this encounter.  The patient has a good understanding of the overall plan. she agrees with it. she will call with any problems that may develop before the next visit here. Total time spent: 30 mins including face to face time and time spent for planning, charting and co-ordination of care   Harriette Ohara, MD 10/29/22    I Gardiner Coins am acting as a Education administrator for [employer]  I have reviewed the above documentation for accuracy and completeness, and I agree with the above.

## 2023-01-18 ENCOUNTER — Other Ambulatory Visit: Payer: Self-pay | Admitting: Nurse Practitioner

## 2023-01-18 DIAGNOSIS — I10 Essential (primary) hypertension: Secondary | ICD-10-CM

## 2023-01-18 DIAGNOSIS — E7800 Pure hypercholesterolemia, unspecified: Secondary | ICD-10-CM

## 2023-02-13 ENCOUNTER — Other Ambulatory Visit: Payer: Self-pay | Admitting: Hematology and Oncology

## 2023-04-16 ENCOUNTER — Other Ambulatory Visit: Payer: Self-pay

## 2023-04-16 DIAGNOSIS — Z853 Personal history of malignant neoplasm of breast: Secondary | ICD-10-CM

## 2023-04-16 DIAGNOSIS — Z1231 Encounter for screening mammogram for malignant neoplasm of breast: Secondary | ICD-10-CM

## 2023-04-17 ENCOUNTER — Other Ambulatory Visit: Payer: Self-pay | Admitting: Nurse Practitioner

## 2023-04-17 DIAGNOSIS — I10 Essential (primary) hypertension: Secondary | ICD-10-CM

## 2023-04-17 DIAGNOSIS — E7800 Pure hypercholesterolemia, unspecified: Secondary | ICD-10-CM

## 2023-05-22 ENCOUNTER — Ambulatory Visit (INDEPENDENT_AMBULATORY_CARE_PROVIDER_SITE_OTHER): Payer: Medicare Other

## 2023-05-22 DIAGNOSIS — Z0000 Encounter for general adult medical examination without abnormal findings: Secondary | ICD-10-CM

## 2023-05-22 NOTE — Patient Instructions (Signed)
Ms. Wedeking , Thank you for taking time to come for your Medicare Wellness Visit. I appreciate your ongoing commitment to your health goals. Please review the following plan we discussed and let me know if I can assist you in the future.   Referrals/Orders/Follow-Ups/Clinician Recommendations: none  This is a list of the screening recommended for you and due dates:  Health Maintenance  Topic Date Due   COVID-19 Vaccine (6 - 2023-24 season) 06/01/2022   Mammogram  04/27/2023   Flu Shot  05/02/2023   Medicare Annual Wellness Visit  05/21/2024   DTaP/Tdap/Td vaccine (3 - Td or Tdap) 04/09/2028   Colon Cancer Screening  12/02/2028   Pneumonia Vaccine  Completed   DEXA scan (bone density measurement)  Completed   Hepatitis C Screening  Completed   Zoster (Shingles) Vaccine  Completed   HPV Vaccine  Aged Out    Advanced directives: (ACP Link)Information on Advanced Care Planning can be found at [facility] Advance Health Care Directives Advance Health Care Directives ([email or web address]   Next Medicare Annual Wellness Visit scheduled for next year: No, office to schedule appointment  Insert Preventive Care attachment Insert FALL PREVENTION attachment if needed

## 2023-05-22 NOTE — Progress Notes (Signed)
Subjective:   Emily Ross is a 67 y.o. female who presents for Medicare Annual (Subsequent) preventive examination.  Visit Complete: Virtual  I connected with  Irmalee B Ross on 05/22/23 by a audio enabled telemedicine application and verified that I am speaking with the correct person using two identifiers.  Patient Location: Home  Provider Location: Office/Clinic  I discussed the limitations of evaluation and management by telemedicine. The patient expressed understanding and agreed to proceed.  Vital Signs: Unable to obtain new vitals due to this being a telehealth visit.  Review of Systems     Cardiac Risk Factors include: advanced age (>71men, >40 women);hypertension     Objective:    Today's Vitals   There is no height or weight on file to calculate BMI.     05/22/2023   11:03 AM 10/29/2022   10:34 AM 06/28/2022   11:37 AM 02/13/2022   12:22 PM 11/20/2021   10:41 AM 11/09/2021   12:06 PM 06/07/2021   11:54 AM  Advanced Directives  Does Patient Have a Medical Advance Directive? No No No No No No No  Would patient like information on creating a medical advance directive?  No - Patient declined  No - Patient declined No - Patient declined  No - Patient declined    Current Medications (verified) Outpatient Encounter Medications as of 05/22/2023  Medication Sig   acetaminophen (TYLENOL) 500 MG tablet Take 2 tablets (1,000 mg total) by mouth every 8 (eight) hours as needed.   anastrozole (ARIMIDEX) 1 MG tablet Take 1 tablet (1 mg total) by mouth daily.   Aspirin-Caffeine (BACK & BODY EXTRA STRENGTH) 500-32.5 MG TABS Take 1 tablet by mouth every 6 (six) hours as needed (pain).   carboxymethylcellul-glycerin (LUBRICATING EYE DROPS) 0.5-0.9 % ophthalmic solution Place 1 drop into both eyes as needed (dry eyes).   Cholecalciferol (DIALYVITE VITAMIN D 5000) 125 MCG (5000 UT) capsule Take 5,000 Units by mouth daily.   diphenhydrAMINE (BENADRYL ALLERGY) 25 mg capsule 1 tablet  at bedtime as needed Orally as needed   diphenhydrAMINE (BENADRYL) 25 MG tablet Take 25 mg by mouth daily as needed for allergies.   EPINEPHrine (EPIPEN 2-PAK) 0.3 mg/0.3 mL IJ SOAJ injection Inject 0.3 mg into the muscle as needed for anaphylaxis.   famotidine (PEPCID) 10 MG tablet Take 10 mg by mouth as needed.   hydrochlorothiazide (HYDRODIURIL) 25 MG tablet TAKE ONE TABLET BY MOUTH ONCE A DAY   metoprolol tartrate (LOPRESSOR) 50 MG tablet TAKE ONE TABLET BY MOUTH TWICE DAILY   Red Yeast Rice Extract (RED YEAST RICE PO) Take 1 tablet by mouth at bedtime.   simvastatin (ZOCOR) 10 MG tablet TAKE ONE TABLET BY MOUTH DAILY AT BEDTIME   Multiple Vitamin (M.V.I. ADULT IV) MVI (Patient not taking: Reported on 05/22/2023)   No facility-administered encounter medications on file as of 05/22/2023.    Allergies (verified) Tomato   History: Past Medical History:  Diagnosis Date   Breast cancer (HCC)    2009 left breast, 2020 right breast   Family history of breast cancer    Family history of stomach cancer    High cholesterol    History of radiation therapy 08/23/19- 09/21/19   Right breast 15 fx of 2.67 Gy to total 40.05 Gy. Right Breast boost 5 fx of 2 Gy each to total 10 Gy   History of seasonal allergies    Hypertension    Pneumonia    Pre-diabetes    Wears glasses  Wears partial dentures    upper   Past Surgical History:  Procedure Laterality Date   AXILLARY SENTINEL NODE BIOPSY Right 04/17/2019   Procedure: Right Axillary Sentinel Node Biopsy;  Surgeon: Emelia Loron, MD;  Location: [facility] OR;  Service: General;  Laterality: Right;   BREAST LUMPECTOMY     BREAST LUMPECTOMY WITH RADIOACTIVE SEED AND SENTINEL LYMPH NODE BIOPSY Right 04/17/2019   Procedure: RIGHT BREAST LUMPECTOMY WITH RADIOACTIVE SEED;  Surgeon: Emelia Loron, MD;  [facility];  Service: General;  Laterality: Right;   CERVICAL CONIZATION W/BX N/A 11/20/2021   Procedure: CONIZATION CERVIX WITH BIOPSY;   Surgeon: Emily Leitz, MD;  Location: [facility];  Service: Gynecology;  Laterality: N/A;   COLONOSCOPY     PORTACATH PLACEMENT Right 04/17/2019   Procedure: INSERTION PORT-A-CATH WITH ULTRASOUND;  Surgeon: Emelia Loron, MD;  Location: [facility] OR;  Service: General;  Laterality: Right;   ROBOTIC ASSISTED LAPAROSCOPIC HYSTERECTOMY AND SALPINGECTOMY Bilateral 02/13/2022   Procedure: TOTAL ABDOMINALHYSTERECTOMY AND SALPINGO-OOPHORECTOMY;  Surgeon: Emily Leitz, MD;  Location: [facility];  Service: Gynecology;  Laterality: Bilateral;   TUBAL LIGATION     WISDOM TOOTH EXTRACTION     Family History  Problem Relation Age of Onset   Diabetes Father    Hypertension Father    Stomach cancer Father 42   Stroke Father    Diabetes Mother    Hypertension Mother    Stroke Mother    Breast cancer Sister 44       negative genetic testing   Cancer Sister    Breast cancer Maternal Aunt 17   Kidney disease Sister    Colon cancer Neg Hx    Esophageal cancer Neg Hx    Rectal cancer Neg Hx    Social History   Socioeconomic History   Marital status: Married    Spouse name: Not on file   Number of children: Not on file   Years of education: Not on file   Highest education level: Not on file  Occupational History   Not on file  Tobacco Use   Smoking status: Never   Smokeless tobacco: Never  Vaping Use   Vaping status: Never Used  Substance and Sexual Activity   Alcohol use: No   Drug use: No   Sexual activity: Yes    Birth control/protection: Post-menopausal, None    Comment: tubal ligation  Other Topics Concern   Not on file  Social History Narrative   ** Merged History Encounter **       Social Determinants of Health   Financial Resource Strain: Low Risk  (05/22/2023)   Overall Financial Resource Strain (CARDIA)    Difficulty of Paying Living Expenses: Not hard at all  Food Insecurity: No Food Insecurity (05/22/2023)   Hunger Vital Sign    Worried About  Running Out of Food in the Last Year: Never true    Ran Out of Food in the Last Year: Never true  Transportation Needs: No Transportation Needs (05/22/2023)   PRAPARE - Administrator, Civil Service (Medical): No    Lack of Transportation (Non-Medical): No  Physical Activity: Insufficiently Active (05/22/2023)   Exercise Vital Sign    Days of Exercise per Week: 4 days    Minutes of Exercise per Session: 30 min  Stress: No Stress Concern Present (05/22/2023)   [employer] of Occupational Health - Occupational Stress Questionnaire    Feeling of Stress : Not at all  Social Connections:  Socially Integrated (05/22/2023)   Social Connection and Isolation Panel [NHANES]    Frequency of Communication with Friends and Family: More than three times a week    Frequency of Social Gatherings with Friends and Family: Three times a week    Attends Religious Services: More than 4 times per year    Active Member of Clubs or Organizations: Yes    Attends Engineer, structural: More than 4 times per year    Marital Status: Married    Tobacco Counseling Counseling given: Not Answered   Clinical Intake:  Pre-visit preparation completed: Yes  Pain : No/denies pain     Nutritional Risks: None Diabetes: No  How often do you need to have someone help you when you read instructions, pamphlets, or other written materials from your doctor or pharmacy?: 1 - Never  Interpreter Needed?: No  Information entered by :: NAllen LPN   Activities of Daily Living    05/22/2023   10:57 AM 06/28/2022   11:38 AM  In your present state of health, do you have any difficulty performing the following activities:  Hearing? 0 0  Vision? 0 0  Difficulty concentrating or making decisions? 0 0  Walking or climbing stairs? 0 0  Dressing or bathing? 0 0  Doing errands, shopping? 0 0  Preparing Food and eating ? N N  Using the Toilet? N N  In the past six months, have you accidently leaked  urine? N N  Do you have problems with loss of bowel control? N N  Managing your Medications? N N  Managing your Finances? N N  Housekeeping or managing your Housekeeping? N N    Patient Care Team: Arnette Felts, FNP as PCP - General (General Practice) Serena Croissant, MD as Consulting Physician (Hematology and Oncology) Emelia Loron, MD as Consulting Physician (General Surgery) Lonie Peak, MD as Attending Physician (Radiation Oncology)  Indicate any recent Medical Services you may have received from other than [facility] providers in the past year (date may be approximate).     Assessment:   This is a routine wellness examination for [place].  Hearing/Vision screen Hearing Screening - Comments:: Denies hearing issues Vision Screening - Comments:: Regular eye exams, Dr. Berdine Dance  Dietary issues and exercise activities discussed:     Goals Addressed             This Visit's Progress    Exercise 3x per week (30 min per time)   On track    I would like to continue to exercise 4 days a week and be mindful of what I am consuming.     Patient Stated       05/22/2023, maintain current exercise routine       Depression Screen    05/22/2023   11:06 AM 10/24/2022   11:33 AM 06/28/2022   11:38 AM 05/17/2022   10:00 AM 06/07/2021   11:55 AM 05/11/2021    2:42 PM 05/10/2020    2:34 PM  PHQ 2/9 Scores  PHQ - 2 Score 1 0 0 0 0 0 0  PHQ- 9 Score 1     0     Fall Risk    05/22/2023   11:05 AM 10/24/2022   11:33 AM 06/28/2022   11:38 AM 05/17/2022   10:00 AM 06/07/2021   11:55 AM  Fall Risk   Falls in the past year? 0 0 0 0 0  Number falls in past yr: 0 0 0 0   Injury  with Fall? 0 0 0 0   Risk for fall due to : Medication side effect No Fall Risks Medication side effect No Fall Risks Medication side effect  Follow up Falls prevention discussed;Falls evaluation completed Falls evaluation completed Falls prevention discussed;Education provided;Falls evaluation completed Falls  evaluation completed Falls evaluation completed;Education provided;Falls prevention discussed    MEDICARE RISK AT HOME: Medicare Risk at Home Any stairs in or around the home?: Yes If so, are there any without handrails?: No Home free of loose throw rugs in walkways, pet beds, electrical cords, etc?: Yes Adequate lighting in your home to reduce risk of falls?: Yes Life alert?: No Use of a cane, walker or w/c?: No Grab bars in the bathroom?: No Shower chair or bench in shower?: No Elevated toilet seat or a handicapped toilet?: Yes  TIMED UP AND GO:  Was the test performed?  No    Cognitive Function:        05/22/2023   11:07 AM 06/28/2022   11:39 AM 06/07/2021   11:56 AM 05/10/2020    2:27 PM  6CIT Screen  What Year? 0 points 0 points 0 points 0 points  What month? 0 points 0 points 0 points 0 points  What time? 0 points 0 points 0 points 0 points  Count back from 20 0 points 0 points 0 points 0 points  Months in reverse 0 points 0 points 0 points 0 points  Repeat phrase 2 points 2 points 4 points 0 points  Total Score 2 points 2 points 4 points 0 points    Immunizations Immunization History  Administered Date(s) Administered   Fluad Quad(high Dose 65+) 06/07/2021, 06/14/2022   Influenza, High Dose Seasonal PF 07/04/2020   Influenza,inj,Quad PF,6+ Mos 06/12/2019   PFIZER(Purple Top)SARS-COV-2 Vaccination 12/05/2019, 12/26/2019, 07/06/2020, 03/11/2021   PNEUMOCOCCAL CONJUGATE-20 12/18/2021   Pfizer Covid-19 Vaccine Bivalent Booster 78yrs & up 07/12/2021   Pneumococcal Polysaccharide-23 11/15/2020   Tdap 05/22/2011, 04/09/2018   Zoster Recombinant(Shingrix) 08/02/2021, 11/28/2021    TDAP status: Up to date  Flu Vaccine status: Due, Education has been provided regarding the importance of this vaccine. Advised may receive this vaccine at local pharmacy or Health Dept. Aware to provide a copy of the vaccination record if obtained from local pharmacy or Health Dept.  Verbalized acceptance and understanding.  Pneumococcal vaccine status: Up to date  Covid-19 vaccine status: Information provided on how to obtain vaccines.   Qualifies for Shingles Vaccine? Yes   Zostavax completed Yes   Shingrix Completed?: Yes  Screening Tests Health Maintenance  Topic Date Due   COVID-19 Vaccine (6 - 2023-24 season) 06/01/2022   MAMMOGRAM  04/27/2023   INFLUENZA VACCINE  05/02/2023   Medicare Annual Wellness (AWV)  05/21/2024   DTaP/Tdap/Td (3 - Td or Tdap) 04/09/2028   Colonoscopy  12/02/2028   Pneumonia Vaccine 61+ Years old  Completed   DEXA SCAN  Completed   Hepatitis C Screening  Completed   Zoster Vaccines- Shingrix  Completed   HPV VACCINES  Aged Out    Health Maintenance  Health Maintenance Due  Topic Date Due   COVID-19 Vaccine (6 - 2023-24 season) 06/01/2022   MAMMOGRAM  04/27/2023   INFLUENZA VACCINE  05/02/2023    Colorectal cancer screening: Type of screening: Colonoscopy. Completed 12/03/2018. Repeat every 8 years  Mammogram status: Ordered 04/16/2023. Pt provided with contact info and advised to call to schedule appt.   Bone Density status: Completed 12/30/2019.   Lung Cancer Screening: (Low Dose CT  Chest recommended if Age 74-80 years, 20 pack-year currently smoking OR have quit w/in 15years.) does not qualify.   Lung Cancer Screening Referral: no  Additional Screening:  Hepatitis C Screening: does qualify; Completed 03/04/2017  Vision Screening: Recommended annual ophthalmology exams for early detection of glaucoma and other disorders of the eye. Is the patient up to date with their annual eye exam?  Yes  Who is the provider or what is the name of the office in which the patient attends annual eye exams? Dr. Berdine Dance If pt is not established with a provider, would they like to be referred to a provider to establish care? No .   Dental Screening: Recommended annual dental exams for proper oral hygiene  Diabetic Foot Exam:  n/a  Community Resource Referral / Chronic Care Management: CRR required this visit?  No   CCM required this visit?  No     Plan:     I have personally reviewed and noted the following in the patient's chart:   Medical and social history Use of alcohol, tobacco or illicit drugs  Current medications and supplements including opioid prescriptions. Patient is not currently taking opioid prescriptions. Functional ability and status Nutritional status Physical activity Advanced directives List of other physicians Hospitalizations, surgeries, and ER visits in previous 12 months Vitals Screenings to include cognitive, depression, and falls Referrals and appointments  In addition, I have reviewed and discussed with patient certain preventive protocols, quality metrics, and best practice recommendations. A written personalized care plan for preventive services as well as general preventive health recommendations were provided to patient.     Barb Merino, LPN   01/07/8118   After Visit Summary: (MyChart) Due to this being a telephonic visit, the after visit summary with patients personalized plan was offered to patient via MyChart   Nurse Notes: none

## 2023-05-27 ENCOUNTER — Emergency Department (HOSPITAL_BASED_OUTPATIENT_CLINIC_OR_DEPARTMENT_OTHER)
Admission: EM | Admit: 2023-05-27 | Discharge: 2023-05-27 | Disposition: A | Discharge status: Home / Self Care | Payer: Medicare Other | Attending: Emergency Medicine | Admitting: Emergency Medicine

## 2023-05-27 ENCOUNTER — Encounter: Payer: Medicare Other | Admitting: Nurse Practitioner

## 2023-05-27 ENCOUNTER — Other Ambulatory Visit: Payer: Self-pay

## 2023-05-27 ENCOUNTER — Other Ambulatory Visit (HOSPITAL_BASED_OUTPATIENT_CLINIC_OR_DEPARTMENT_OTHER): Payer: Self-pay

## 2023-05-27 ENCOUNTER — Emergency Department (HOSPITAL_BASED_OUTPATIENT_CLINIC_OR_DEPARTMENT_OTHER): Payer: Medicare Other

## 2023-05-27 DIAGNOSIS — Z7982 Long term (current) use of aspirin: Secondary | ICD-10-CM | POA: Diagnosis not present

## 2023-05-27 DIAGNOSIS — W010XXA Fall on same level from slipping, tripping and stumbling without subsequent striking against object, initial encounter: Secondary | ICD-10-CM | POA: Insufficient documentation

## 2023-05-27 DIAGNOSIS — S4992XA Unspecified injury of left shoulder and upper arm, initial encounter: Secondary | ICD-10-CM | POA: Diagnosis present

## 2023-05-27 DIAGNOSIS — Z79899 Other long term (current) drug therapy: Secondary | ICD-10-CM | POA: Insufficient documentation

## 2023-05-27 DIAGNOSIS — S42352A Displaced comminuted fracture of shaft of humerus, left arm, initial encounter for closed fracture: Secondary | ICD-10-CM | POA: Diagnosis not present

## 2023-05-27 DIAGNOSIS — Z853 Personal history of malignant neoplasm of breast: Secondary | ICD-10-CM | POA: Insufficient documentation

## 2023-05-27 DIAGNOSIS — S42292A Other displaced fracture of upper end of left humerus, initial encounter for closed fracture: Secondary | ICD-10-CM

## 2023-05-27 DIAGNOSIS — I10 Essential (primary) hypertension: Secondary | ICD-10-CM | POA: Insufficient documentation

## 2023-05-27 DIAGNOSIS — S0083XA Contusion of other part of head, initial encounter: Secondary | ICD-10-CM | POA: Diagnosis not present

## 2023-05-27 MED ORDER — OXYCODONE-ACETAMINOPHEN 5-325 MG PO TABS
1.0000 | ORAL_TABLET | Freq: Four times a day (QID) | ORAL | 0 refills | Status: DC | PRN
Start: 2023-05-27 — End: 2023-08-07
  Filled 2023-05-27: qty 20, 3d supply, fill #0

## 2023-05-27 MED ORDER — OXYCODONE-ACETAMINOPHEN 5-325 MG PO TABS
1.0000 | ORAL_TABLET | Freq: Once | ORAL | Status: AC
  Administered 2023-05-27: 1 via ORAL
  Filled 2023-05-27: qty 1

## 2023-05-27 NOTE — ED Triage Notes (Signed)
Patient presents to ED via POV from home post fall. Reports getting tripped on her shoes. Reports positive head strike. Denies LOC. Denies being on blood thinners. Reports left shoulder pain. Ambulatory. GCS 15.

## 2023-05-27 NOTE — Progress Notes (Deleted)
Madelaine Bhat, CMA,acting as a Neurosurgeon for Arnette Felts, FNP.,have documented all relevant documentation on the behalf of Arnette Felts, FNP,as directed by  Arnette Felts, FNP while in the presence of Arnette Felts, FNP.  Subjective:    Patient ID: Emily Ross , female    DOB: Jan 18, 1956 , 67 y.o.   MRN: [id number]  No chief complaint on file.   HPI  Patient presents today for HM, Patient reports compliance with medications. Patient denies any chest pain, SOB, or headache. Patient has no concerns today.     Past Medical History:  Diagnosis Date  . Breast cancer (HCC)    2009 left breast, 2020 right breast  . Family history of breast cancer   . Family history of stomach cancer   . High cholesterol   . History of radiation therapy 08/23/19- 09/21/19   Right breast 15 fx of 2.67 Gy to total 40.05 Gy. Right Breast boost 5 fx of 2 Gy each to total 10 Gy  . History of seasonal allergies   . Hypertension   . Pneumonia   . Pre-diabetes   . Wears glasses   . Wears partial dentures    upper     Family History  Problem Relation Age of Onset  . Diabetes Father   . Hypertension Father   . Stomach cancer Father 35  . Stroke Father   . Diabetes Mother   . Hypertension Mother   . Stroke Mother   . Breast cancer Sister 61       negative genetic testing  . Cancer Sister   . Breast cancer Maternal Aunt 80  . Kidney disease Sister   . Colon cancer Neg Hx   . Esophageal cancer Neg Hx   . Rectal cancer Neg Hx      Current Outpatient Medications:  .  acetaminophen (TYLENOL) 500 MG tablet, Take 2 tablets (1,000 mg total) by mouth every 8 (eight) hours as needed., Disp: 30 tablet, Rfl: 0 .  anastrozole (ARIMIDEX) 1 MG tablet, Take 1 tablet (1 mg total) by mouth daily., Disp: 90 tablet, Rfl: 3 .  Aspirin-Caffeine (BACK & BODY EXTRA STRENGTH) 500-32.5 MG TABS, Take 1 tablet by mouth every 6 (six) hours as needed (pain)., Disp: , Rfl:  .  carboxymethylcellul-glycerin (LUBRICATING EYE  DROPS) 0.5-0.9 % ophthalmic solution, Place 1 drop into both eyes as needed (dry eyes)., Disp: 30 mL, Rfl: 6 .  Cholecalciferol (DIALYVITE VITAMIN D 5000) 125 MCG (5000 UT) capsule, Take 5,000 Units by mouth daily., Disp: , Rfl:  .  diphenhydrAMINE (BENADRYL ALLERGY) 25 mg capsule, 1 tablet at bedtime as needed Orally as needed, Disp: , Rfl:  .  diphenhydrAMINE (BENADRYL) 25 MG tablet, Take 25 mg by mouth daily as needed for allergies., Disp: , Rfl:  .  EPINEPHrine (EPIPEN 2-PAK) 0.3 mg/0.3 mL IJ SOAJ injection, Inject 0.3 mg into the muscle as needed for anaphylaxis., Disp: 2 each, Rfl: 1 .  famotidine (PEPCID) 10 MG tablet, Take 10 mg by mouth as needed., Disp: , Rfl:  .  hydrochlorothiazide (HYDRODIURIL) 25 MG tablet, TAKE ONE TABLET BY MOUTH ONCE A DAY, Disp: 90 tablet, Rfl: 0 .  metoprolol tartrate (LOPRESSOR) 50 MG tablet, TAKE ONE TABLET BY MOUTH TWICE DAILY, Disp: 180 tablet, Rfl: 0 .  Multiple Vitamin (M.V.I. ADULT IV), MVI (Patient not taking: Reported on 05/22/2023), Disp: , Rfl:  .  Red Yeast Rice Extract (RED YEAST RICE PO), Take 1 tablet by mouth at bedtime., Disp: ,  Rfl:  .  simvastatin (ZOCOR) 10 MG tablet, TAKE ONE TABLET BY MOUTH DAILY AT BEDTIME, Disp: 90 tablet, Rfl: 0   Allergies  Allergen Reactions  . Tomato Swelling      The patient states she uses {contraceptive methods:5051} for birth control. Patient's last menstrual period was 12/03/2011.. {Dysmenorrhea-menorrhagia:21918}. Negative for: breast discharge, breast lump(s), breast pain and breast self exam. Associated symptoms include abnormal vaginal bleeding. Pertinent negatives include abnormal bleeding (hematology), anxiety, decreased libido, depression, difficulty falling sleep, dyspareunia, history of infertility, nocturia, sexual dysfunction, sleep disturbances, urinary incontinence, urinary urgency, vaginal discharge and vaginal itching. Diet regular.The patient states her exercise level is    . The patient's tobacco  use is:  Social History   Tobacco Use  Smoking Status Never  Smokeless Tobacco Never  . She has been exposed to passive smoke. The patient's alcohol use is:  Social History   Substance and Sexual Activity  Alcohol Use No  . Additional information: Last pap ***, next one scheduled for ***.    Review of Systems   There were no vitals filed for this visit. There is no height or weight on file to calculate BMI.  Wt Readings from Last 3 Encounters:  10/29/22 160 lb 11.2 oz (72.9 kg)  10/24/22 160 lb (72.6 kg)  06/28/22 158 lb (71.7 kg)     Objective:  Physical Exam      Assessment And Plan:     Encounter for annual health examination  Essential hypertension  Prediabetes  Elevated LDL cholesterol level     No follow-ups on file. Patient was given opportunity to ask questions. Patient verbalized understanding of the plan and was able to repeat key elements of the plan. All questions were answered to their satisfaction.   Arnette Felts, FNP  I, Arnette Felts, FNP, have reviewed all documentation for this visit. The documentation on 05/27/23 for the exam, diagnosis, procedures, and orders are all accurate and complete.

## 2023-05-27 NOTE — Discharge Instructions (Addendum)
Thank you for allowing [place] to be a part of your care today.  You were evaluated for left shoulder pain related to a fall.   Your x-ray shows that you have fractured the humeral head (the ball to the ball and socket of your shoulder).  This is managed with a sling that completely immobilizes your shoulder and arm.  Please keep this sling on 24 hours a day, except for when showering.  When showering, do not use your left arm.    I have sent pain medicine to your pharmacy to help with severe pain.  I also recommend taking 600-800 mg of ibuprofen every 6-8 hours in addition to the pain medicine.   You may continue to ice your injury 2-3 times per day for no longer than 20 minutes at a time.    Please call [facility] and schedule a follow up appointment with them.   Return to the ED if you develop sudden worsening of your symptoms or if you have any new concerns.

## 2023-05-27 NOTE — ED Provider Notes (Signed)
[place] [facility] Provider Note   CSN: [id number] Arrival date & time: 05/27/23  1051     History  Chief Complaint  Patient presents with   Emily Ross    CACEE FLOWERS is a 67 y.o. female with past medical history significant for hypertension, hyperlipidemia, breast cancer with prior radiation therapy presents to the ED complaining of left shoulder pain after a fall.  Patient states she got tripped up on her shoes and caused her fall onto her left side.  She did hit her face but denies head or neck pain.  She does not take blood thinners.  Denies loss of consciousness.         Home Medications Prior to Admission medications   Medication Sig Start Date End Date Taking? Authorizing Provider  acetaminophen (TYLENOL) 500 MG tablet Take 2 tablets (1,000 mg total) by mouth every 8 (eight) hours as needed. 02/14/22   Gerald Leitz, MD  anastrozole (ARIMIDEX) 1 MG tablet Take 1 tablet (1 mg total) by mouth daily. 10/29/22   Serena Croissant, MD  Aspirin-Caffeine (BACK & BODY EXTRA STRENGTH) 500-32.5 MG TABS Take 1 tablet by mouth every 6 (six) hours as needed (pain).    [provider]  carboxymethylcellul-glycerin (LUBRICATING EYE DROPS) 0.5-0.9 % ophthalmic solution Place 1 drop into both eyes as needed (dry eyes). 10/29/22   Serena Croissant, MD  Cholecalciferol (DIALYVITE VITAMIN D 5000) 125 MCG (5000 UT) capsule Take 5,000 Units by mouth daily.    [provider]  diphenhydrAMINE (BENADRYL ALLERGY) 25 mg capsule 1 tablet at bedtime as needed Orally as needed    [provider]  diphenhydrAMINE (BENADRYL) 25 MG tablet Take 25 mg by mouth daily as needed for allergies.    [provider]  EPINEPHrine (EPIPEN 2-PAK) 0.3 mg/0.3 mL IJ SOAJ injection Inject 0.3 mg into the muscle as needed for anaphylaxis. 10/24/22   Arnette Felts, FNP  famotidine (PEPCID) 10 MG tablet Take 10 mg by mouth as needed.    [provider]   hydrochlorothiazide (HYDRODIURIL) 25 MG tablet TAKE ONE TABLET BY MOUTH ONCE A DAY 04/17/23   Arnette Felts, FNP  metoprolol tartrate (LOPRESSOR) 50 MG tablet TAKE ONE TABLET BY MOUTH TWICE DAILY 04/17/23   Arnette Felts, FNP  Multiple Vitamin (M.V.I. ADULT IV) MVI Patient not taking: Reported on 05/22/2023    [provider]  Red Yeast Rice Extract (RED YEAST RICE PO) Take 1 tablet by mouth at bedtime.    [provider]  simvastatin (ZOCOR) 10 MG tablet TAKE ONE TABLET BY MOUTH DAILY AT BEDTIME 04/17/23   Arnette Felts, FNP      Allergies    Tomato    Review of Systems   Review of Systems  Musculoskeletal:  Positive for arthralgias (left shoulder pain) and joint swelling (left shoulder). Negative for neck pain.  Neurological:  Negative for syncope and headaches.    Physical Exam Updated Vital Signs BP (!) 153/92   Pulse 67   Temp 97.7 F (36.5 C) (Oral)   Resp 17   Ht 5\' 7"  (1.702 m)   Wt 72.6 kg   LMP 12/03/2011   SpO2 98%   BMI 25.06 kg/m  Physical Exam Vitals and nursing note reviewed.  Constitutional:      General: She is not in acute distress.    Appearance: Normal appearance. She is not ill-appearing or diaphoretic.  HENT:     Head: Normocephalic. Contusion (left eyebrow) present. No  raccoon eyes, abrasion, right periorbital erythema, left periorbital erythema or laceration.  Eyes:     General: Vision grossly intact.     Extraocular Movements: Extraocular movements intact.  Cardiovascular:     Rate and Rhythm: Normal rate and regular rhythm.  Pulmonary:     Effort: Pulmonary effort is normal.  Musculoskeletal:     Left shoulder: Swelling, tenderness and bony tenderness present. No deformity. Decreased range of motion. Normal pulse.       Arms:     Cervical back: Full passive range of motion without pain. No spinous process tenderness or muscular tenderness.  Skin:    General: Skin is warm and dry.     Capillary Refill: Capillary refill takes  less than 2 seconds.  Neurological:     Mental Status: She is alert. Mental status is at baseline.  Psychiatric:        Mood and Affect: Mood normal.        Behavior: Behavior normal.     ED Results / Procedures / Treatments   Labs (all labs ordered are listed, but only abnormal results are displayed) Labs Reviewed - No data to display  EKG None  Radiology DG Shoulder Left  Result Date: 05/27/2023 CLINICAL DATA:  Fall, unable to move arm. EXAM: LEFT SHOULDER - 2+ VIEW COMPARISON:  None Available. FINDINGS: There is an acute fracture of the left humeral head with suspected extension into the glenohumeral joint and involvement of the greater tuberosity. Glenohumeral and acromioclavicular alignment is maintained. There is no erosive change. The soft tissues are unremarkable. IMPRESSION: Acute comminuted fracture of the humeral head with possible intra-articular extension. Electronically Signed   By: Lesia Hausen M.D.   On: 05/27/2023 11:28    Procedures Procedures    Medications Ordered in ED Medications  oxyCODONE-acetaminophen (PERCOCET/ROXICET) 5-325 MG per tablet 1 tablet (has no administration in time range)    ED Course/ Medical Decision Making/ A&P                                 Medical Decision Making Amount and/or Complexity of Data Reviewed Radiology: ordered.   This patient presents to the ED with chief complaint(s) of left shoulder pain after a fall.  The complaint involves an extensive differential diagnosis and also carries with it a high risk of complications and morbidity.    The differential diagnosis includes acute fracture or dislocation of left shoulder, contusion, shoulder sprain   The initial plan is to obtain x-ray of left shoulder  Initial Assessment:   Exam significant for overall well-appearing patient who is not in acute distress.  Small contusion over left eyebrow with tenderness on palpation.  No palpable facial or skull fractures.  Normal ROM  of neck without pain.  Left shoulder with tenderness proximally.  Mild swelling appreciated.  Patient has been icing her shoulder.  Severely decreased ROM due to pain.  Radial pulse is 2+.  Skin is warm and dry.    Independent visualization and interpretation of imaging: I independently visualized the following imaging with scope of interpretation limited to determining acute life threatening conditions related to emergency care: left shoulder x-ray, which revealed acute comminuted fracture of the humeral head with possible intra-articular extension.    Treatment and Reassessment: Patient given pain medicine with improvement in pain symptoms.  Patient placed in sling and swathe for shoulder immobilization.   Disposition:   Patient provided with ortho  referral information.  Instructed patient on proper sling use.  Pain medicine sent to pharmacy.  Recommended patient continue to take ibuprofen for mild-moderate pain.    The patient has been appropriately medically screened and/or stabilized in the ED. I have low suspicion for any other emergent medical condition which would require further screening, evaluation or treatment in the ED or require inpatient management. At time of discharge the patient is hemodynamically stable and in no acute distress. I have discussed work-up results and diagnosis with patient and answered all questions. Patient is agreeable with discharge plan. We discussed strict return precautions for returning to the emergency department and they verbalized understanding.            Final Clinical Impression(s) / ED Diagnoses Final diagnoses:  Closed fracture of head of left humerus, initial encounter  Contusion of face, initial encounter    Rx / DC Orders ED Discharge Orders     None         Lenard Simmer, PA-C 05/27/23 1301    Melene Plan, DO 05/27/23 1353

## 2023-05-29 ENCOUNTER — Telehealth: Payer: Self-pay

## 2023-05-29 NOTE — Transitions of Care (Post Inpatient/ED Visit) (Signed)
05/29/2023  Name: Emily Ross MRN: [id number] DOB: 1956/08/16  Today's TOC FU Call Status: Today's TOC FU Call Status:: Successful TOC FU Call Completed TOC FU Call Complete Date: 05/29/23 Patient's Name and Date of Birth confirmed.  Transition Care Management Follow-up Telephone Call Date of Discharge: 05/27/23 Discharge Facility: [facility] Type of Discharge: Emergency Department Reason for ED Visit: Other: How have you been since you were released from the hospital?: Better Any questions or concerns?: No  Items Reviewed: Did you receive and understand the discharge instructions provided?: Yes Medications obtained,verified, and reconciled?: Yes (Medications Reviewed) Any new allergies since your discharge?: No Dietary orders reviewed?: No Do you have support at home?: Yes People in Home: spouse  Medications Reviewed Today: Medications Reviewed Today     Reviewed by Marlyn Corporal, CMA (Certified Medical Assistant) on 05/29/23 at [phone]  Med List Status: <None>   Medication Order Taking? Sig Documenting Provider Last Dose Status Informant  acetaminophen (TYLENOL) 500 MG tablet [id number] Yes Take 2 tablets (1,000 mg total) by mouth every 8 (eight) hours as needed. Gerald Leitz, MD Taking Active   anastrozole (ARIMIDEX) 1 MG tablet [id number] Yes Take 1 tablet (1 mg total) by mouth daily. Serena Croissant, MD Taking Active   Aspirin-Caffeine (BACK & BODY EXTRA STRENGTH) 500-32.5 MG TABS [id number] Yes Take 1 tablet by mouth every 6 (six) hours as needed (pain). [provider] Taking Active   carboxymethylcellul-glycerin (LUBRICATING EYE DROPS) 0.5-0.9 % ophthalmic solution [id number] Yes Place 1 drop into both eyes as needed (dry eyes). Serena Croissant, MD Taking Active   Cholecalciferol (DIALYVITE VITAMIN D 5000) 125 MCG (5000 UT) capsule [id number] Yes Take 5,000 Units by mouth daily. [provider] Taking Active Self  diphenhydrAMINE (BENADRYL ALLERGY) 25  mg capsule [id number] Yes 1 tablet at bedtime as needed Orally as needed [provider] Taking Active   EPINEPHrine (EPIPEN 2-PAK) 0.3 mg/0.3 mL IJ SOAJ injection [id number] Yes Inject 0.3 mg into the muscle as needed for anaphylaxis. Arnette Felts, FNP Taking Active   famotidine (PEPCID) 10 MG tablet [id number] Yes Take 10 mg by mouth as needed. [provider] Taking Active Self  hydrochlorothiazide (HYDRODIURIL) 25 MG tablet [id number] Yes TAKE ONE TABLET BY MOUTH ONCE A Vedia Pereyra, FNP Taking Active   metoprolol tartrate (LOPRESSOR) 50 MG tablet [id number] Yes TAKE ONE TABLET BY MOUTH TWICE DAILY Arnette Felts, FNP Taking Active   Multiple Vitamin (M.V.I. ADULT IV) [id number] No MVI  Patient not taking: Reported on 05/22/2023   [provider] Not Taking Active   oxyCODONE-acetaminophen (PERCOCET/ROXICET) 5-325 MG tablet [id number] Yes Take 1-2 tablets by mouth every 6 (six) hours as needed for severe pain. Clark, Meghan R, PA-C Taking Active   Red Yeast Rice Extract (RED YEAST RICE PO) [id number] Yes Take 1 tablet by mouth at bedtime. [provider] Taking Active Self  simvastatin (ZOCOR) 10 MG tablet [id number] Yes TAKE ONE TABLET BY MOUTH DAILY AT BEDTIME Arnette Felts, FNP Taking Active             Home Care and Equipment/Supplies: Were Home Health Services Ordered?: No Any new equipment or medical supplies ordered?: No  Functional Questionnaire: Do you need assistance with bathing/showering or dressing?: No Do you need assistance with meal preparation?: No Do you need assistance with eating?: No Do you have difficulty maintaining continence: No Do you need assistance with getting out of bed/getting out of a chair/moving?: No Do you have difficulty managing or  taking your medications?: No  Follow up appointments reviewed: PCP Follow-up appointment confirmed?: No MD Provider Line Number:[phone] Given: Yes Specialist Hospital Follow-up  appointment confirmed?: Yes Date of Specialist follow-up appointment?: 05/29/23 Follow-Up Specialty Provider:: ortho Do you need transportation to your follow-up appointment?: No Do you understand care options if your condition(s) worsen?: Yes-patient verbalized understanding    SIGNATURE Lisabeth Devoid, CMA

## 2023-07-17 ENCOUNTER — Other Ambulatory Visit: Payer: Self-pay | Admitting: Nurse Practitioner

## 2023-07-17 DIAGNOSIS — E7800 Pure hypercholesterolemia, unspecified: Secondary | ICD-10-CM

## 2023-07-17 DIAGNOSIS — I10 Essential (primary) hypertension: Secondary | ICD-10-CM

## 2023-07-29 ENCOUNTER — Telehealth: Payer: Self-pay | Admitting: Medical Oncology

## 2023-07-29 NOTE — Telephone Encounter (Signed)
WF [id number] Understanding and Predicting Breast Cancer Events after Treatment (UPBEAT)   Outgoing call: 15:47  LVMOM with patient regarding study follow-up call for year 4. Informed patient that an email has been sent to her from study for completion of questionnaires. Asked patient to return call at earliest convenience regarding any cardiovascular events or hospitalizations pertaining to.   Patient thanked. Call back number provided.   Rexene Edison, RN, BSN, CCR[employer] Clinical Research Nurse Lead 07/29/2023 3:47 PM

## 2023-08-06 ENCOUNTER — Telehealth: Payer: Self-pay | Admitting: Medical Oncology

## 2023-08-06 NOTE — Telephone Encounter (Signed)
WF [id number] Understanding and Predicting Breast Cancer Events after Treatment (UPBEAT)   Outgoing call (x2): 13:15 Follow up, Year 4  LVMOM with patient regarding study follow-up call for year 4. Informed patient that an email has been sent to her from study for completion of questionnaires. Asked patient to return call at earliest convenience regarding any cardiovascular events or hospitalizations pertaining to.   Patient thanked. Call back number provided.   Rexene Edison, RN, BSN, CCR[employer] Clinical Research Nurse Lead 08/06/2023 1:15 PM

## 2023-08-06 NOTE — Progress Notes (Unsigned)
Madelaine Bhat, CMA,acting as a Neurosurgeon for Arnette Felts, FNP.,have documented all relevant documentation on the behalf of Arnette Felts, FNP,as directed by  Arnette Felts, FNP while in the presence of Arnette Felts, FNP.  Subjective:  Patient ID: Emily Ross , female    DOB: [birth year] , 67 y.o.   MRN: [id number]  No chief complaint on file.   HPI  Patient presents today for a bp and  pre dm follow up, Patient reports compliance with medication. Patient denies any chest pain, SOB, or headaches. Patient has no concerns today.     Past Medical History:  Diagnosis Date  . Breast cancer (HCC)    2009 left breast, 2020 right breast  . Family history of breast cancer   . Family history of stomach cancer   . High cholesterol   . History of radiation therapy 08/23/19- 09/21/19   Right breast 15 fx of 2.67 Gy to total 40.05 Gy. Right Breast boost 5 fx of 2 Gy each to total 10 Gy  . History of seasonal allergies   . Hypertension   . Pneumonia   . Pre-diabetes   . Wears glasses   . Wears partial dentures    upper     Family History  Problem Relation Age of Onset  . Diabetes Father   . Hypertension Father   . Stomach cancer Father 29  . Stroke Father   . Diabetes Mother   . Hypertension Mother   . Stroke Mother   . Breast cancer Sister 68       negative genetic testing  . Cancer Sister   . Breast cancer Maternal Aunt 80  . Kidney disease Sister   . Colon cancer Neg Hx   . Esophageal cancer Neg Hx   . Rectal cancer Neg Hx      Current Outpatient Medications:  .  acetaminophen (TYLENOL) 500 MG tablet, Take 2 tablets (1,000 mg total) by mouth every 8 (eight) hours as needed., Disp: 30 tablet, Rfl: 0 .  anastrozole (ARIMIDEX) 1 MG tablet, Take 1 tablet (1 mg total) by mouth daily., Disp: 90 tablet, Rfl: 3 .  Aspirin-Caffeine (BACK & BODY EXTRA STRENGTH) 500-32.5 MG TABS, Take 1 tablet by mouth every 6 (six) hours as needed (pain)., Disp: , Rfl:  .  carboxymethylcellul-glycerin  (LUBRICATING EYE DROPS) 0.5-0.9 % ophthalmic solution, Place 1 drop into both eyes as needed (dry eyes)., Disp: 30 mL, Rfl: 6 .  Cholecalciferol (DIALYVITE VITAMIN D 5000) 125 MCG (5000 UT) capsule, Take 5,000 Units by mouth daily., Disp: , Rfl:  .  diphenhydrAMINE (BENADRYL ALLERGY) 25 mg capsule, 1 tablet at bedtime as needed Orally as needed, Disp: , Rfl:  .  EPINEPHrine (EPIPEN 2-PAK) 0.3 mg/0.3 mL IJ SOAJ injection, Inject 0.3 mg into the muscle as needed for anaphylaxis., Disp: 2 each, Rfl: 1 .  famotidine (PEPCID) 10 MG tablet, Take 10 mg by mouth as needed., Disp: , Rfl:  .  hydrochlorothiazide (HYDRODIURIL) 25 MG tablet, TAKE ONE TABLET BY MOUTH ONCE A DAY, Disp: 90 tablet, Rfl: 0 .  metoprolol tartrate (LOPRESSOR) 50 MG tablet, TAKE ONE TABLET BY MOUTH TWICE DAILY, Disp: 180 tablet, Rfl: 0 .  Multiple Vitamin (M.V.I. ADULT IV), MVI (Patient not taking: Reported on 05/22/2023), Disp: , Rfl:  .  oxyCODONE-acetaminophen (PERCOCET/ROXICET) 5-325 MG tablet, Take 1-2 tablets by mouth every 6 (six) hours as needed for severe pain., Disp: 20 tablet, Rfl: 0 .  Red Yeast Rice Extract (RED YEAST RICE  PO), Take 1 tablet by mouth at bedtime., Disp: , Rfl:  .  simvastatin (ZOCOR) 10 MG tablet, TAKE ONE TABLET BY MOUTH DAILY AT BEDTIME, Disp: 90 tablet, Rfl: 0   Allergies  Allergen Reactions  . Tomato Swelling     Review of Systems   There were no vitals filed for this visit. There is no height or weight on file to calculate BMI.  Wt Readings from Last 3 Encounters:  05/27/23 160 lb (72.6 kg)  10/29/22 160 lb 11.2 oz (72.9 kg)  10/24/22 160 lb (72.6 kg)    The 10-year ASCVD risk score (Arnett DK, et al., 2019) is: 14%   Values used to calculate the score:     Age: 4 years     Sex: Female     Is Non-Hispanic African American: Yes     Diabetic: No     Tobacco smoker: No     Systolic Blood Pressure: 153 mmHg     Is BP treated: Yes     HDL Cholesterol: 66 mg/dL     Total Cholesterol: 181  mg/dL  Objective:  Physical Exam      Assessment And Plan:  Essential hypertension  Prediabetes    No follow-ups on file.  Patient was given opportunity to ask questions. Patient verbalized understanding of the plan and was able to repeat key elements of the plan. All questions were answered to their satisfaction.    Jeanell Sparrow, FNP, have reviewed all documentation for this visit. The documentation on 08/06/23 for the exam, diagnosis, procedures, and orders are all accurate and complete.   IF YOU HAVE BEEN REFERRED TO A SPECIALIST, IT MAY TAKE 1-2 WEEKS TO SCHEDULE/PROCESS THE REFERRAL. IF YOU HAVE NOT HEARD FROM US/SPECIALIST IN TWO WEEKS, PLEASE GIVE [place] A CALL AT [phone].

## 2023-08-07 ENCOUNTER — Ambulatory Visit (INDEPENDENT_AMBULATORY_CARE_PROVIDER_SITE_OTHER): Payer: Medicare Other | Admitting: Nurse Practitioner

## 2023-08-07 ENCOUNTER — Encounter: Payer: Self-pay | Admitting: Nurse Practitioner

## 2023-08-07 VITALS — BP 138/90 | HR 76 | Temp 98.6°F | Ht 67.0 in | Wt 162.0 lb

## 2023-08-07 DIAGNOSIS — Z0000 Encounter for general adult medical examination without abnormal findings: Secondary | ICD-10-CM | POA: Diagnosis not present

## 2023-08-07 DIAGNOSIS — Z79899 Other long term (current) drug therapy: Secondary | ICD-10-CM

## 2023-08-07 DIAGNOSIS — I10 Essential (primary) hypertension: Secondary | ICD-10-CM

## 2023-08-07 DIAGNOSIS — Z8781 Personal history of (healed) traumatic fracture: Secondary | ICD-10-CM

## 2023-08-07 DIAGNOSIS — R7303 Prediabetes: Secondary | ICD-10-CM | POA: Diagnosis not present

## 2023-08-07 DIAGNOSIS — Z170 Estrogen receptor positive status [ER+]: Secondary | ICD-10-CM

## 2023-08-07 DIAGNOSIS — E7800 Pure hypercholesterolemia, unspecified: Secondary | ICD-10-CM

## 2023-08-07 DIAGNOSIS — C50411 Malignant neoplasm of upper-outer quadrant of right female breast: Secondary | ICD-10-CM

## 2023-08-07 LAB — POCT URINALYSIS DIP (CLINITEK)
Bilirubin, UA: NEGATIVE
Glucose, UA: NEGATIVE mg/dL
Ketones, POC UA: NEGATIVE mg/dL
Leukocytes, UA: NEGATIVE
Nitrite, UA: NEGATIVE
POC PROTEIN,UA: NEGATIVE
Spec Grav, UA: <=1.005 — AB (ref 1.010–1.025)
Urobilinogen, UA: 0.2 U/dL
pH, UA: 7 (ref 5.0–8.0)

## 2023-08-07 NOTE — Patient Instructions (Addendum)
Magnesium glycinate with evening meal or before bed this will help with anxiety and blood pressure

## 2023-08-07 NOTE — Progress Notes (Signed)
Madelaine Bhat, CMA,acting as a Neurosurgeon for Arnette Felts, FNP.,have documented all relevant documentation on the behalf of Arnette Felts, FNP,as directed by  Arnette Felts, FNP while in the presence of Arnette Felts, FNP.  Subjective:    Patient ID: Emily Ross , female    DOB: 19-Apr-1956 , 67 y.o.   MRN: [id number]  Chief Complaint  Patient presents with   Annual Exam    HPI  Patient presents today for HM, Patient reports compliance with medication. Patient denies any chest pain, SOB, or headaches. Patient has no concerns today. She has been seeing the Orthopedic after fracturing her shoulder, she tripped over the water, left shoulder. No surgery. She lost her brother in law to pancreatic cancer and her biological brother passed from a spot on his lungs, both passed this year. She has been stress eating.   BP Readings from Last 3 Encounters: 08/07/23 : (!) 140/100 05/27/23 : (!) 153/92 10/29/22 : (!) 163/93       Past Medical History:  Diagnosis Date   Allergy    Breast cancer (HCC)    2009 left breast, 2020 right breast   Family history of breast cancer    Family history of stomach cancer    High cholesterol    History of radiation therapy 08/23/19- 09/21/19   Right breast 15 fx of 2.67 Gy to total 40.05 Gy. Right Breast boost 5 fx of 2 Gy each to total 10 Gy   History of seasonal allergies    Hypertension    Pneumonia    Pre-diabetes    Wears glasses    Wears partial dentures    upper     Family History  Problem Relation Age of Onset   Diabetes Father    Hypertension Father    Stomach cancer Father 10   Stroke Father    Diabetes Mother    Hypertension Mother    Stroke Mother    Breast cancer Sister 69       negative genetic testing   Cancer Sister    Cancer Brother    Breast cancer Maternal Aunt 46   Kidney disease Sister    Colon cancer Neg Hx    Esophageal cancer Neg Hx    Rectal cancer Neg Hx      Current Outpatient Medications:    acetaminophen  (TYLENOL) 500 MG tablet, Take 2 tablets (1,000 mg total) by mouth every 8 (eight) hours as needed., Disp: 30 tablet, Rfl: 0   anastrozole (ARIMIDEX) 1 MG tablet, Take 1 tablet (1 mg total) by mouth daily., Disp: 90 tablet, Rfl: 3   Aspirin-Caffeine (BACK & BODY EXTRA STRENGTH) 500-32.5 MG TABS, Take 1 tablet by mouth every 6 (six) hours as needed (pain)., Disp: , Rfl:    Cholecalciferol (DIALYVITE VITAMIN D 5000) 125 MCG (5000 UT) capsule, Take 5,000 Units by mouth daily., Disp: , Rfl:    diphenhydrAMINE (BENADRYL ALLERGY) 25 mg capsule, 1 tablet at bedtime as needed Orally as needed, Disp: , Rfl:    EPINEPHrine (EPIPEN 2-PAK) 0.3 mg/0.3 mL IJ SOAJ injection, Inject 0.3 mg into the muscle as needed for anaphylaxis., Disp: 2 each, Rfl: 1   famotidine (PEPCID) 10 MG tablet, Take 10 mg by mouth as needed., Disp: , Rfl:    hydrochlorothiazide (HYDRODIURIL) 25 MG tablet, TAKE ONE TABLET BY MOUTH ONCE A DAY, Disp: 90 tablet, Rfl: 0   metoprolol tartrate (LOPRESSOR) 50 MG tablet, TAKE ONE TABLET BY MOUTH TWICE DAILY, Disp: 180 tablet,  Rfl: 0   Red Yeast Rice Extract (RED YEAST RICE PO), Take 1 tablet by mouth at bedtime., Disp: , Rfl:    simvastatin (ZOCOR) 10 MG tablet, TAKE ONE TABLET BY MOUTH DAILY AT BEDTIME, Disp: 90 tablet, Rfl: 0   amLODipine (NORVASC) 2.5 MG tablet, Take 1 tablet (2.5 mg total) by mouth daily., Disp: 30 tablet, Rfl: 1   Multiple Vitamin (M.V.I. ADULT IV), MVI (Patient not taking: Reported on 05/22/2023), Disp: , Rfl:    Allergies  Allergen Reactions   Tomato Swelling      The patient states she uses status post hysterectomy for birth control. Patient's last menstrual period was 12/03/2011.. Negative for Dysmenorrhea and Negative for Menorrhagia. Negative for: breast discharge, breast lump(s), breast pain and breast self exam. Associated symptoms include abnormal vaginal bleeding. Pertinent negatives include abnormal bleeding (hematology), anxiety, decreased libido, depression,  difficulty falling sleep, dyspareunia, history of infertility, nocturia, sexual dysfunction, sleep disturbances, urinary incontinence, urinary urgency, vaginal discharge and vaginal itching. Diet regular.The patient states her exercise level is moderate 3-4 times a week.   The patient's tobacco use is:  Social History   Tobacco Use  Smoking Status Never  Smokeless Tobacco Never  . She has been exposed to passive smoke. The patient's alcohol use is:  Social History   Substance and Sexual Activity  Alcohol Use No    Review of Systems  Constitutional: Negative.   HENT: Negative.    Eyes: Negative.   Respiratory: Negative.    Cardiovascular: Negative.   Gastrointestinal: Negative.   Endocrine: Negative.   Genitourinary: Negative.   Musculoskeletal: Negative.   Skin: Negative.   Allergic/Immunologic: Negative.   Neurological: Negative.   Hematological: Negative.   Psychiatric/Behavioral: Negative.       Today's Vitals   08/07/23 1511 08/07/23 1602  BP: (!) 140/100 (!) 138/90  Pulse: 76   Temp: 98.6 F (37 C)   TempSrc: Oral   Weight: 162 lb (73.5 kg)   Height: 5\' 7"  (1.702 m)   PainSc: 4    PainLoc: Shoulder    Body mass index is 25.37 kg/m.  Wt Readings from Last 3 Encounters:  08/20/23 162 lb (73.5 kg)  08/07/23 162 lb (73.5 kg)  05/27/23 160 lb (72.6 kg)     Objective:  Physical Exam Vitals reviewed.  Constitutional:      General: She is not in acute distress.    Appearance: Normal appearance. She is well-developed.  HENT:     Head: Normocephalic and atraumatic.     Right Ear: Hearing, tympanic membrane, ear canal and external ear normal. There is no impacted cerumen.     Left Ear: Hearing, tympanic membrane, ear canal and external ear normal. There is no impacted cerumen.     Nose: Nose normal.     Mouth/Throat:     Mouth: Mucous membranes are moist.  Eyes:     General: Lids are normal.     Extraocular Movements: Extraocular movements intact.      Conjunctiva/sclera: Conjunctivae normal.     Pupils: Pupils are equal, round, and reactive to light.     Funduscopic exam:    Right eye: No papilledema.        Left eye: No papilledema.  Neck:     Thyroid: No thyroid mass.     Vascular: No carotid bruit.  Cardiovascular:     Rate and Rhythm: Normal rate and regular rhythm.     Pulses: Normal pulses.     Heart sounds:  Normal heart sounds. No murmur heard. Pulmonary:     Effort: Pulmonary effort is normal. No respiratory distress.     Breath sounds: Normal breath sounds. No wheezing.  Chest:  Breasts:    Tanner Score is 5.  Abdominal:     General: Abdomen is flat. Bowel sounds are normal. There is no distension.     Palpations: Abdomen is soft.     Tenderness: There is no abdominal tenderness.  Genitourinary:    Comments: Followed by GYN Musculoskeletal:        General: No swelling. Normal range of motion.     Cervical back: Full passive range of motion without pain, normal range of motion and neck supple.     Right lower leg: No edema.     Left lower leg: No edema.  Skin:    General: Skin is warm and dry.     Capillary Refill: Capillary refill takes less than 2 seconds.  Neurological:     General: No focal deficit present.     Mental Status: She is alert and oriented to person, place, and time.     Cranial Nerves: No cranial nerve deficit.     Sensory: No sensory deficit.  Psychiatric:        Mood and Affect: Mood normal.        Behavior: Behavior normal.        Thought Content: Thought content normal.        Judgment: Judgment normal.         Assessment And Plan:     Encounter for annual health examination Assessment & Plan: Behavior modifications discussed and diet history reviewed.   Pt will continue to exercise regularly and modify diet with low GI, plant based foods and decrease intake of processed foods.  Recommend intake of daily multivitamin, Vitamin D, and calcium.  Recommend mammogram and colonoscopy for  preventive screenings, as well as recommend immunizations that include influenza, TDAP, and Shingles    Elevated blood pressure reading with diagnosis of hypertension Assessment & Plan: Blood pressure is elevated repeat is slightly improved, has been under more stress will have her to come in 2 weeks for NV BP check. EKG done with NSR HR 73  Orders: -     EKG 12-Lead -     POCT URINALYSIS DIP (CLINITEK) -     Microalbumin / creatinine urine ratio -     CMP14+EGFR  Prediabetes Assessment & Plan: HgbA1c is stable. Continue focusing on healthy diet low in sugar and starches.   Orders: -     Hemoglobin A1c  Elevated LDL cholesterol level Assessment & Plan: Cholesterol levels are stable, continue statin.   Orders: -     Lipid panel  History of left shoulder fracture Assessment & Plan: Continue f/u with Orthopedics  Orders: -     DG Bone Density; Future  Malignant neoplasm of upper-outer quadrant of right breast in female, estrogen receptor positive (HCC) Assessment & Plan: Continue follow up with oncology   Other long term (current) drug therapy -     CBC with Differential/Platelet    Return for 1 year physical, 6 month bp check; 2 week NV BP check. Patient was given opportunity to ask questions. Patient verbalized understanding of the plan and was able to repeat key elements of the plan. All questions were answered to their satisfaction.   Arnette Felts, FNP  I, Arnette Felts, FNP, have reviewed all documentation for this visit. The documentation on 08/07/23 for  the exam, diagnosis, procedures, and orders are all accurate and complete.

## 2023-08-08 LAB — CBC WITH DIFFERENTIAL/PLATELET
Basophils Absolute: 0 10*3/uL (ref 0.0–0.2)
Basos: 1 %
EOS (ABSOLUTE): 0.1 10*3/uL (ref 0.0–0.4)
Eos: 2 %
Hematocrit: 38.6 % (ref 34.0–46.6)
Hemoglobin: 12.2 g/dL (ref 11.1–15.9)
Immature Grans (Abs): 0 10*3/uL (ref 0.0–0.1)
Immature Granulocytes: 0 %
Lymphocytes Absolute: 1.8 10*3/uL (ref 0.7–3.1)
Lymphs: 45 %
MCH: 29.6 pg (ref 26.6–33.0)
MCHC: 31.6 g/dL (ref 31.5–35.7)
MCV: 94 fL (ref 79–97)
Monocytes Absolute: 0.3 10*3/uL (ref 0.1–0.9)
Monocytes: 7 %
Neutrophils Absolute: 1.9 10*3/uL (ref 1.4–7.0)
Neutrophils: 45 %
Platelets: 291 10*3/uL (ref 150–450)
RBC: 4.12 x10E6/uL (ref 3.77–5.28)
RDW: 12.1 % (ref 11.7–15.4)
WBC: 4 10*3/uL (ref 3.4–10.8)

## 2023-08-08 LAB — LIPID PANEL
Chol/HDL Ratio: 2.7 ratio (ref 0.0–4.4)
Cholesterol, Total: 185 mg/dL (ref 100–199)
HDL: 69 mg/dL (ref >39)
LDL Chol Calc (NIH): 103 mg/dL — ABNORMAL HIGH (ref 0–99)
Triglycerides: 70 mg/dL (ref 0–149)
VLDL Cholesterol Cal: 13 mg/dL (ref 5–40)

## 2023-08-08 LAB — CMP14+EGFR
ALT: 12 [IU]/L (ref 0–32)
AST: 18 [IU]/L (ref 0–40)
Albumin: 4.6 g/dL (ref 3.9–4.9)
Alkaline Phosphatase: 82 [IU]/L (ref 44–121)
BUN/Creatinine Ratio: 14 (ref 12–28)
BUN: 11 mg/dL (ref 8–27)
Bilirubin Total: 0.5 mg/dL (ref 0.0–1.2)
CO2: 26 mmol/L (ref 20–29)
Calcium: 10.6 mg/dL — ABNORMAL HIGH (ref 8.7–10.3)
Chloride: 97 mmol/L (ref 96–106)
Creatinine, Ser: 0.8 mg/dL (ref 0.57–1.00)
Globulin, Total: 2.9 g/dL (ref 1.5–4.5)
Glucose: 99 mg/dL (ref 70–99)
Potassium: 4.3 mmol/L (ref 3.5–5.2)
Sodium: 139 mmol/L (ref 134–144)
Total Protein: 7.5 g/dL (ref 6.0–8.5)
eGFR: 80 mL/min/{1.73_m2} (ref >59)

## 2023-08-08 LAB — HEMOGLOBIN A1C
Est. average glucose Bld gHb Est-mCnc: 120 mg/dL
Hgb A1c MFr Bld: 5.8 % — ABNORMAL HIGH (ref 4.8–5.6)

## 2023-08-08 LAB — MICROALBUMIN / CREATININE URINE RATIO
Creatinine, Urine: 27 mg/dL
Microalb/Creat Ratio: 15 mg/g{creat} (ref 0–29)
Microalbumin, Urine: 4.1 ug/mL

## 2023-08-14 ENCOUNTER — Telehealth: Payer: Self-pay | Admitting: Medical Oncology

## 2023-08-14 NOTE — Telephone Encounter (Signed)
[facility] [id number] Understanding and Predicting Breast Cancer Events after Treatment (UPBEAT)   Outgoing call (x3): 14:43  Follow up, Year 4  LVMOM with patient regarding study follow-up call for year 4. Informed patient that an email has been sent to her from study for completion of questionnaires. Asked patient to return call at earliest convenience regarding any cardiovascular events or hospitalizations pertaining to. Patient's chart review shows one hospitalization unrelated to cardiac and no cardiac events this past year.  Patient thanked. Call back number provided.   Rexene Edison, RN, BSN, [facility] Clinical Research Nurse Lead 08/14/2023 2:41 PM

## 2023-08-20 ENCOUNTER — Ambulatory Visit: Payer: Medicare Other

## 2023-08-20 VITALS — BP 132/98 | HR 74 | Temp 98.1°F | Ht 67.0 in | Wt 162.0 lb

## 2023-08-20 DIAGNOSIS — I10 Essential (primary) hypertension: Secondary | ICD-10-CM

## 2023-08-20 MED ORDER — AMLODIPINE BESYLATE 2.5 MG PO TABS: 2.5000 mg | ORAL_TABLET | Freq: Every day | ORAL | 1 refills | Status: DC

## 2023-08-20 NOTE — Assessment & Plan Note (Signed)
Continue follow-up with oncology 

## 2023-08-20 NOTE — Progress Notes (Signed)
Patient presents today for bpc. She currently takes hydrochlorothiazide 25MG  in the morning  & Metoprolol 50MG  one in the morning & one in the evening. She admits spacing out the time she takes Metoprolol 12 hours apart. Denies headache, chest pain & sob. She admits exercising regularly. Initial bp: 142/100. Bp taken again after 10 minutes:  BP Readings from Last 3 Encounters:  08/20/23 (!) 132/98  08/07/23 (!) 138/90  05/27/23 (!) 153/92  Per provider she wants to add Amlodipine 2.5mg  to take nightly with dinner. Follow up in 2 weeks. Patient aware. Appointment scheduled.

## 2023-08-20 NOTE — Assessment & Plan Note (Signed)
HgbA1c is stable. Continue focusing on healthy diet low in sugar and starches

## 2023-08-20 NOTE — Assessment & Plan Note (Signed)

## 2023-08-20 NOTE — Patient Instructions (Signed)
Hypertension, Adult Hypertension is another name for high blood pressure. High blood pressure forces your heart to work harder to pump blood. This can cause problems over time. There are two numbers in a blood pressure reading. There is a top number (systolic) over a bottom number (diastolic). It is best to have a blood pressure that is below 120/80. What are the causes? The cause of this condition is not known. Some other conditions can lead to high blood pressure. What increases the risk? Some lifestyle factors can make you more likely to develop high blood pressure: Smoking. Not getting enough exercise or physical activity. Being overweight. Having too much fat, sugar, calories, or salt (sodium) in your diet. Drinking too much alcohol. Other risk factors include: Having any of these conditions: Heart disease. Diabetes. High cholesterol. Kidney disease. Obstructive sleep apnea. Having a family history of high blood pressure and high cholesterol. Age. The risk increases with age. Stress. What are the signs or symptoms? High blood pressure may not cause symptoms. Very high blood pressure (hypertensive crisis) may cause: Headache. Fast or uneven heartbeats (palpitations). Shortness of breath. Nosebleed. Vomiting or feeling like you may vomit (nauseous). Changes in how you see. Very bad chest pain. Feeling dizzy. Seizures. How is this treated? This condition is treated by making healthy lifestyle changes, such as: Eating healthy foods. Exercising more. Drinking less alcohol. Your doctor may prescribe medicine if lifestyle changes do not help enough and if: Your top number is above 130. Your bottom number is above 80. Your personal target blood pressure may vary. Follow these instructions at home: Eating and drinking  If told, follow the DASH eating plan. To follow this plan: Fill one half of your plate at each meal with fruits and vegetables. Fill one fourth of your plate  at each meal with whole grains. Whole grains include whole-wheat pasta, brown rice, and whole-grain bread. Eat or drink low-fat dairy products, such as skim milk or low-fat yogurt. Fill one fourth of your plate at each meal with low-fat (lean) proteins. Low-fat proteins include fish, chicken without skin, eggs, beans, and tofu. Avoid fatty meat, cured and processed meat, or chicken with skin. Avoid pre-made or processed food. Limit the amount of salt in your diet to less than 1,500 mg each day. Do not drink alcohol if: Your doctor tells you not to drink. You are pregnant, may be pregnant, or are planning to become pregnant. If you drink alcohol: Limit how much you have to: 0-1 drink a day for women. 0-2 drinks a day for men. Know how much alcohol is in your drink. In the [place] one drink equals one 12 oz bottle of beer (355 mL), one 5 oz glass of wine (148 mL), or one 1 oz glass of hard liquor (44 mL). Lifestyle  Work with your doctor to stay at a healthy weight or to lose weight. Ask your doctor what the best weight is for you. Get at least 30 minutes of exercise that causes your heart to beat faster (aerobic exercise) most days of the week. This may include walking, swimming, or biking. Get at least 30 minutes of exercise that strengthens your muscles (resistance exercise) at least 3 days a week. This may include lifting weights or doing Pilates. Do not smoke or use any products that contain nicotine or tobacco. If you need help quitting, ask your doctor. Check your blood pressure at home as told by your doctor. Keep all follow-up visits. Medicines Take over-the-counter and prescription medicines   only as told by your doctor. Follow directions carefully. Do not skip doses of blood pressure medicine. The medicine does not work as well if you skip doses. Skipping doses also puts you at risk for problems. Ask your doctor about side effects or reactions to medicines that you should watch  for. Contact a doctor if: You think you are having a reaction to the medicine you are taking. You have headaches that keep coming back. You feel dizzy. You have swelling in your ankles. You have trouble with your vision. Get help right away if: You get a very bad headache. You start to feel mixed up (confused). You feel weak or numb. You feel faint. You have very bad pain in your: Chest. Belly (abdomen). You vomit more than once. You have trouble breathing. These symptoms may be an emergency. Get help right away. Call 911. Do not wait to see if the symptoms will go away. Do not drive yourself to the hospital. Summary Hypertension is another name for high blood pressure. High blood pressure forces your heart to work harder to pump blood. For most people, a normal blood pressure is less than 120/80. Making healthy choices can help lower blood pressure. If your blood pressure does not get lower with healthy choices, you may need to take medicine. This information is not intended to replace advice given to you by your health care provider. Make sure you discuss any questions you have with your health care provider. Document Revised: 07/06/2021 Document Reviewed: 07/06/2021 Elsevier Patient Education  2024 [employer]  

## 2023-08-20 NOTE — Assessment & Plan Note (Signed)
Cholesterol levels are stable, continue statin 

## 2023-08-20 NOTE — Assessment & Plan Note (Addendum)
Blood pressure is elevated repeat is slightly improved, has been under more stress will have her to come in 2 weeks for NV BP check. EKG done with NSR HR 73

## 2023-08-20 NOTE — Assessment & Plan Note (Signed)
Continue f/u with Orthopedics

## 2023-08-22 NOTE — Telephone Encounter (Signed)
Telephone call  

## 2023-09-03 ENCOUNTER — Ambulatory Visit: Payer: Medicare Other

## 2023-09-03 VITALS — BP 120/70 | HR 65 | Temp 98.5°F | Ht 67.0 in | Wt 162.0 lb

## 2023-09-03 DIAGNOSIS — I10 Essential (primary) hypertension: Secondary | ICD-10-CM

## 2023-09-03 NOTE — Progress Notes (Signed)
Patient presents today for a BP check, Patient currently taking amLODipine 2.5mg  PM, hydrochlorothiazide 25mg  AM, metoprolol tartrate 50mg  AM and PM.  BP Readings from Last 3 Encounters:  09/03/23 (!) 140/90  08/20/23 (!) 132/98  08/07/23 (!) 138/90   Per provider - continue current medications and regular follow up.

## 2023-10-12 ENCOUNTER — Other Ambulatory Visit: Payer: Self-pay | Admitting: Nurse Practitioner

## 2023-10-15 ENCOUNTER — Other Ambulatory Visit: Payer: Self-pay | Admitting: Nurse Practitioner

## 2023-10-15 DIAGNOSIS — I10 Essential (primary) hypertension: Secondary | ICD-10-CM

## 2023-10-15 DIAGNOSIS — E7800 Pure hypercholesterolemia, unspecified: Secondary | ICD-10-CM

## 2023-10-30 ENCOUNTER — Inpatient Hospital Stay: Payer: Medicare Other | Attending: Hematology and Oncology | Admitting: Hematology and Oncology

## 2023-10-30 VITALS — BP 140/82 | HR 72 | Temp 97.9°F | Resp 18 | Ht 67.0 in | Wt 161.7 lb

## 2023-10-30 DIAGNOSIS — Z923 Personal history of irradiation: Secondary | ICD-10-CM | POA: Diagnosis not present

## 2023-10-30 DIAGNOSIS — Z79811 Long term (current) use of aromatase inhibitors: Secondary | ICD-10-CM | POA: Diagnosis not present

## 2023-10-30 DIAGNOSIS — Z170 Estrogen receptor positive status [ER+]: Secondary | ICD-10-CM | POA: Insufficient documentation

## 2023-10-30 DIAGNOSIS — C50411 Malignant neoplasm of upper-outer quadrant of right female breast: Secondary | ICD-10-CM | POA: Diagnosis present

## 2023-10-30 NOTE — Progress Notes (Signed)
Patient Care Team: Arnette Felts, FNP as PCP - General (General Practice) Serena Croissant, MD as Consulting Physician (Hematology and Oncology) Emelia Loron, MD as Consulting Physician (General Surgery) Lonie Peak, MD as Attending Physician (Radiation Oncology)  DIAGNOSIS:  Encounter Diagnosis  Name Primary?   Malignant neoplasm of upper-outer quadrant of right breast in female, estrogen receptor positive (HCC) Yes    SUMMARY OF ONCOLOGIC HISTORY: Oncology History  Malignant neoplasm of upper-outer quadrant of right breast in female, estrogen receptor positive (HCC)  04/02/2019 Initial Diagnosis   Screening mammogram detected a mass in the right axilla. [place] confirmed a 0.9cm right breast mass. Biopsy showed IDC, grade 2, HER-2+ (3+), ER+ 100%, PR+ 5%, Ki67 15%.    04/08/2019 Cancer Staging   Staging form: Breast, AJCC 8th Edition - Clinical stage from 04/08/2019: Stage IA (cT1b, cN0, cM0, G2, ER+, PR+, HER2+) - Signed by Serena Croissant, MD on 04/08/2019   04/14/2019 Genetic Testing   Patient had genetic testing done for her personal and family history of breast cancer.  Negative genetic testing on the [facility] Breast Cancer STAT panel. The STAT Breast cancer panel offered by [facility] includes sequencing and rearrangement analysis for the following 9 genes:  ATM, BRCA1, BRCA2, CDH1, CHEK2, PALB2, PTEN, STK11 and TP53.  The report date is 04/14/2019.   04/17/2019 Surgery   Right lumpectomy Dwain Sarna): IDC, 2.0cm, grade 2, clear margins, 5 axillary lymph nodes negative for carcinoma   08/24/2019 - 09/21/2019 Radiation Therapy   Adjuvant radiation   10/01/2019 -  Anti-estrogen oral therapy   Anastrozole daily   Carcinoma of upper-inner quadrant of right breast in female, estrogen receptor positive (HCC)  04/08/2019 Cancer Staging   Staging form: Breast, AJCC 8th Edition - Clinical stage from 04/08/2019: Stage IA (cT1b, cN0, cM0, G2, ER+, PR+, HER2+) - Signed by Lonie Peak, MD on  04/08/2019   05/15/2019 - 04/29/2020 Chemotherapy   Patient is on Treatment Plan : BREAST weekly PACLitaxel / trastuzumab / Maintenance trastuzumab every 21 days       CHIEF COMPLIANT: Herceptin maintenance  HISTORY OF PRESENT ILLNESS:   History of Present Illness   The patient presents for a routine follow-up visit.  She has been taking a medication for four years, starting in February 2021, and it has been well-tolerated. She experiences hot flashes, but they are not bothersome and are manageable.  In late August, she sustained a fracture to her left shoulder after tripping over a plastic object. The fracture involved the ball of the shoulder joint and was described as a clean break in three places. The fracture healed well without displacement, and she has completed her last therapy appointment today. This was her first bone fracture.  She experiences postnasal drip but avoids taking Sudafed due to its effects on blood pressure. She is considering using Claritin or generic loratadine as an alternative. She mentions concern about a friend's recent pneumonia diagnosis but does not report any significant symptoms.  She mentions the recent passing of her brother and brother-in-law, who was like a brother to her. She participates in exercise classes on Wednesday mornings, which she finds beneficial for balance and stamina.         ALLERGIES:  is allergic to tomato.  MEDICATIONS:  Current Outpatient Medications  Medication Sig Dispense Refill   acetaminophen (TYLENOL) 500 MG tablet Take 2 tablets (1,000 mg total) by mouth every 8 (eight) hours as needed. 30 tablet 0   amLODipine (NORVASC) 2.5 MG tablet Take 1  tablet (2.5 mg total) by mouth daily. 90 tablet 1   anastrozole (ARIMIDEX) 1 MG tablet Take 1 tablet (1 mg total) by mouth daily. 90 tablet 3   Aspirin-Caffeine (BACK & BODY EXTRA STRENGTH) 500-32.5 MG TABS Take 1 tablet by mouth every 6 (six) hours as needed (pain).      Cholecalciferol (DIALYVITE VITAMIN D 5000) 125 MCG (5000 UT) capsule Take 5,000 Units by mouth daily.     diphenhydrAMINE (BENADRYL ALLERGY) 25 mg capsule 1 tablet at bedtime as needed Orally as needed     EPINEPHrine (EPIPEN 2-PAK) 0.3 mg/0.3 mL IJ SOAJ injection Inject 0.3 mg into the muscle as needed for anaphylaxis. 2 each 1   famotidine (PEPCID) 10 MG tablet Take 10 mg by mouth as needed.     hydrochlorothiazide (HYDRODIURIL) 25 MG tablet TAKE ONE TABLET BY MOUTH ONCE A DAY 90 tablet 1   metoprolol tartrate (LOPRESSOR) 50 MG tablet TAKE ONE TABLET BY MOUTH TWICE DAILY 180 tablet 0   Multiple Vitamin (M.V.I. ADULT IV) MVI (Patient not taking: Reported on 05/22/2023)     Red Yeast Rice Extract (RED YEAST RICE PO) Take 1 tablet by mouth at bedtime.     simvastatin (ZOCOR) 10 MG tablet TAKE ONE TABLET BY MOUTH DAILY AT BEDTIME 90 tablet 1   No current facility-administered medications for this visit.    PHYSICAL EXAMINATION: ECOG PERFORMANCE STATUS: 1 - Symptomatic but completely ambulatory  Vitals:   10/30/23 1012  BP: (!) 140/82  Pulse: 72  Resp: 18  Temp: 97.9 F (36.6 C)  SpO2: 100%   Filed Weights   10/30/23 1012  Weight: 161 lb 11.2 oz (73.3 kg)    Physical Exam          (exam performed in the presence of a chaperone)  LABORATORY DATA:  I have reviewed the data as listed    Latest Ref Rng & Units 08/07/2023    4:26 PM 10/24/2022   12:33 PM 05/17/2022   11:15 AM  CMP  Glucose 70 - 99 mg/dL 99  161  096   BUN 8 - 27 mg/dL 11  15  10    Creatinine 0.57 - 1.00 mg/dL 0.45  4.09  8.11   Sodium 134 - 144 mmol/L 139  144  138   Potassium 3.5 - 5.2 mmol/L 4.3  4.3  4.6   Chloride 96 - 106 mmol/L 97  102  98   CO2 20 - 29 mmol/L 26  28  25    Calcium 8.7 - 10.3 mg/dL 91.4  78.2  95.6   Total Protein 6.0 - 8.5 g/dL 7.5   7.5   Total Bilirubin 0.0 - 1.2 mg/dL 0.5   0.4   Alkaline Phos 44 - 121 IU/L 82   81   AST 0 - 40 IU/L 18   13   ALT 0 - 32 IU/L 12   11     Lab  Results  Component Value Date   WBC 4.0 08/07/2023   HGB 12.2 08/07/2023   HCT 38.6 08/07/2023   MCV 94 08/07/2023   PLT 291 08/07/2023   NEUTROABS 1.9 08/07/2023    ASSESSMENT & PLAN:  Malignant neoplasm of upper-outer quadrant of right breast in female, estrogen receptor positive (HCC) 04/02/2019:Screening mammogram detected a mass in the right axilla. [place] confirmed a 0.9cm right breast mass. Biopsy showed IDC, grade 2, HER-2+ (3+), ER+ 100%, PR+ 5%, Ki67 15%.    Treatment plan: 1.  Breast conserving surgery  with sentinel lymph node biopsy 2.  Adjuvant chemotherapy with Taxol Herceptin weekly x12 followed by Herceptin maintenance for 1 year started 05/15/2019 completed July 2021 3.  Adjuvant radiation therapy will be starting 08/22/2019-09/21/2019 4.  Followed by adjuvant antiestrogen therapy with anastrozole 1 mg daily x5 to 7 years started 11/10/2019 ------------------------------------------------------------------------------------------------------------------------------------------- Current Treatment: Anastrozole started 11/10/2019   Breast cancer surveillance: 1. Mammogram 05/06/23 at [facility]: Benign, breast density category B 2. Breast exam 10/30/2023: Benign   Anastrozole toxicities: Denies any hot flashes or myalgias.   Return to clinic in 1 year for follow-up ------------------------------------- Assessment and Plan    Postmenopausal Symptoms Patient has been on hormone therapy for four years with manageable hot flashes and no significant issues. Discussed benefits of continuing hormone therapy for symptom management and low risk of adverse effects given current tolerance. - Continue current hormone therapy - Ensure adequate refills - Follow-up in one year  Postnasal Drip Patient reports postnasal drip but avoids decongestants due to hypertension. Discussed alternative treatment options including loratadine, which does not affect blood pressure and is effective in drying up  sinuses. - Recommend loratadine for symptom relief  Left Shoulder Fracture Patient sustained a left shoulder fracture in August involving the humeral head. The fracture healed well without displacement. Patient has completed physical therapy and regained good function. Discussed excellent prognosis given complete healing and lack of complications. - No further action required  General Health Maintenance Patient's last mammogram was on July 27 at [facility]. Regular exercise is maintained, contributing to balance and stamina. Discussed importance of continuing annual mammograms and regular exercise for overall health maintenance. - Continue annual mammograms - Encourage regular exercise.          No orders of the defined types were placed in this encounter.  The patient has a good understanding of the overall plan. she agrees with it. she will call with any problems that may develop before the next visit here. Total time spent: 30 mins including face to face time and time spent for planning, charting and co-ordination of care   Tamsen Meek, MD 10/30/23

## 2023-10-30 NOTE — Assessment & Plan Note (Signed)
04/02/2019:Screening mammogram detected a mass in the right axilla. [place] confirmed a 0.9cm right breast mass. Biopsy showed IDC, grade 2, HER-2+ (3+), ER+ 100%, PR+ 5%, Ki67 15%.    Treatment plan: 1.  Breast conserving surgery with sentinel lymph node biopsy 2.  Adjuvant chemotherapy with Taxol Herceptin weekly x12 followed by Herceptin maintenance for 1 year started 05/15/2019 completed July 2021 3.  Adjuvant radiation therapy will be starting 08/22/2019-09/21/2019 4.  Followed by adjuvant antiestrogen therapy with anastrozole 1 mg daily x5 to 7 years started 11/10/2019 ------------------------------------------------------------------------------------------------------------------------------------------- Current Treatment: Anastrozole started 11/10/2019   Breast cancer surveillance: 1. Mammogram 05/06/23 at [facility]: Benign, breast density category B 2. Breast exam 10/30/2023: Benign   Anastrozole toxicities: Denies any hot flashes or myalgias.   Return to clinic in 1 year for follow-up

## 2023-11-01 ENCOUNTER — Encounter: Payer: Self-pay | Admitting: Radiology

## 2024-01-10 ENCOUNTER — Other Ambulatory Visit: Payer: Self-pay | Admitting: Nurse Practitioner

## 2024-01-10 DIAGNOSIS — I10 Essential (primary) hypertension: Secondary | ICD-10-CM

## 2024-01-25 ENCOUNTER — Other Ambulatory Visit: Payer: Self-pay | Admitting: Hematology and Oncology

## 2024-02-04 ENCOUNTER — Ambulatory Visit: Payer: Medicare Other | Admitting: Nurse Practitioner

## 2024-02-17 ENCOUNTER — Encounter: Payer: Self-pay | Admitting: Nurse Practitioner

## 2024-02-17 ENCOUNTER — Ambulatory Visit: Admitting: Nurse Practitioner

## 2024-02-17 VITALS — BP 140/90 | HR 56 | Temp 98.9°F | Ht 67.0 in | Wt 164.8 lb

## 2024-02-17 DIAGNOSIS — Z6825 Body mass index (BMI) 25.0-25.9, adult: Secondary | ICD-10-CM

## 2024-02-17 DIAGNOSIS — Z2821 Immunization not carried out because of patient refusal: Secondary | ICD-10-CM

## 2024-02-17 DIAGNOSIS — E663 Overweight: Secondary | ICD-10-CM | POA: Diagnosis not present

## 2024-02-17 DIAGNOSIS — R7303 Prediabetes: Secondary | ICD-10-CM | POA: Diagnosis not present

## 2024-02-17 DIAGNOSIS — J302 Other seasonal allergic rhinitis: Secondary | ICD-10-CM | POA: Diagnosis not present

## 2024-02-17 DIAGNOSIS — I10 Essential (primary) hypertension: Secondary | ICD-10-CM | POA: Diagnosis not present

## 2024-02-17 MED ORDER — PREDNISONE 10 MG (21) PO TBPK: ORAL_TABLET | ORAL | 0 refills | Status: AC

## 2024-02-17 NOTE — Progress Notes (Unsigned)
 [place], CMA,acting as a Neurosurgeon for Emily Epley, FNP.,have documented all relevant documentation on the behalf of Emily Epley, FNP,as directed by  Emily Epley, FNP while in the presence of Emily Epley, FNP.  Subjective:  Patient ID: Emily Ross , female    DOB: 1956/03/03 , 68 y.o.   MRN: [id number]  Chief Complaint  Patient presents with   Hypertension    Patient presents today for a bp and pre dm follow up, Patient reports compliance with medication. Patient denies any chest pain, SOB, or headaches. Patient has no concerns today.     HPI  Here for blood pressure f/u. She took her medications this morning.   She uses nasals sprays but does not like anything in her nose. She is taking benadryl .     Past Medical History:  Diagnosis Date   Allergy    Breast cancer (HCC)    2009 left breast, 2020 right breast   Family history of breast cancer    Family history of stomach cancer    High cholesterol    History of radiation therapy 08/23/19- 09/21/19   Right breast 15 fx of 2.67 Gy to total 40.05 Gy. Right Breast boost 5 fx of 2 Gy each to total 10 Gy   History of seasonal allergies    Hypertension    Pneumonia    Pre-diabetes    Wears glasses    Wears partial dentures    upper     Family History  Problem Relation Age of Onset   Diabetes Father    Hypertension Father    Stomach cancer Father 34   Stroke Father    Diabetes Mother    Hypertension Mother    Stroke Mother    Breast cancer Sister 13       negative genetic testing   Cancer Sister    Cancer Brother    Breast cancer Maternal Aunt 61   Kidney disease Sister    Colon cancer Neg Hx    Esophageal cancer Neg Hx    Rectal cancer Neg Hx      Current Outpatient Medications:    acetaminophen  (TYLENOL ) 500 MG tablet, Take 2 tablets (1,000 mg total) by mouth every 8 (eight) hours as needed., Disp: 30 tablet, Rfl: 0   amLODipine  (NORVASC ) 2.5 MG tablet, Take 1 tablet (2.5 mg total) by mouth daily.,  Disp: 90 tablet, Rfl: 1   anastrozole  (ARIMIDEX ) 1 MG tablet, TAKE ONE TABLET BY MOUTH ONE TIME DAILY, Disp: 90 tablet, Rfl: 0   Cholecalciferol (DIALYVITE VITAMIN D 5000) 125 MCG (5000 UT) capsule, Take 5,000 Units by mouth daily., Disp: , Rfl:    diphenhydrAMINE  (BENADRYL  ALLERGY) 25 mg capsule, 1 tablet at bedtime as needed Orally as needed, Disp: , Rfl:    EPINEPHrine  (EPIPEN  2-PAK) 0.3 mg/0.3 mL IJ SOAJ injection, Inject 0.3 mg into the muscle as needed for anaphylaxis., Disp: 2 each, Rfl: 1   famotidine  (PEPCID ) 10 MG tablet, Take 10 mg by mouth as needed., Disp: , Rfl:    hydrochlorothiazide  (HYDRODIURIL ) 25 MG tablet, TAKE ONE TABLET BY MOUTH ONCE A DAY, Disp: 90 tablet, Rfl: 1   metoprolol  tartrate (LOPRESSOR ) 50 MG tablet, TAKE ONE TABLET BY MOUTH TWICE DAILY, Disp: 180 tablet, Rfl: 0   Multiple Vitamin (M.V.I. ADULT IV), , Disp: , Rfl:    predniSONE  (STERAPRED UNI-PAK 21 TAB) 10 MG (21) TBPK tablet, Take as directed, Disp: 21 tablet, Rfl: 0   Red Yeast Rice Extract (RED  YEAST RICE PO), Take 1 tablet by mouth at bedtime., Disp: , Rfl:    simvastatin  (ZOCOR ) 10 MG tablet, TAKE ONE TABLET BY MOUTH DAILY AT BEDTIME, Disp: 90 tablet, Rfl: 1   Allergies  Allergen Reactions   Tomato Swelling     Review of Systems  Constitutional: Negative.   HENT:  Positive for congestion (nasal congestion) and rhinorrhea. Negative for ear pain.        Throat itching  Eyes:  Positive for itching.  Respiratory: Negative.  Negative for shortness of breath.   Cardiovascular: Negative.  Negative for chest pain and palpitations.  Gastrointestinal: Negative.   Neurological: Negative.  Negative for headaches.  Psychiatric/Behavioral: Negative.       Today's Vitals   02/17/24 1152 02/17/24 1213  BP: (!) 150/90 (!) 140/90  Pulse: (!) 56   Temp: 98.9 F (37.2 C)   TempSrc: Oral   Weight: 164 lb 12.8 oz (74.8 kg)   Height: 5\' 7"  (1.702 m)   PainSc: 0-No pain    Body mass index is 25.81 kg/m.  Wt  Readings from Last 3 Encounters:  02/17/24 164 lb 12.8 oz (74.8 kg)  10/30/23 161 lb 11.2 oz (73.3 kg)  09/03/23 162 lb (73.5 kg)    Objective:  Physical Exam Vitals and nursing note reviewed.  Constitutional:      General: She is not in acute distress.    Appearance: Normal appearance.  Cardiovascular:     Rate and Rhythm: Normal rate and regular rhythm.     Pulses: Normal pulses.     Heart sounds: Normal heart sounds. No murmur heard. Pulmonary:     Effort: Pulmonary effort is normal. No respiratory distress.     Breath sounds: Normal breath sounds. No wheezing.  Skin:    General: Skin is warm and dry.     Capillary Refill: Capillary refill takes less than 2 seconds.  Neurological:     Mental Status: She is alert and oriented to person, place, and time.     Cranial Nerves: No cranial nerve deficit.     Motor: No weakness.  Psychiatric:        Mood and Affect: Mood normal.        Behavior: Behavior normal.        Thought Content: Thought content normal.        Judgment: Judgment normal.         Assessment And Plan:  Essential hypertension Assessment & Plan: Blood pressure is elevated, repeat is slightly improved.   Orders: -     BMP8+eGFR  Prediabetes Assessment & Plan: HgbA1c is stable. Continue focusing on healthy diet low in sugar and starches.   Orders: -     Hemoglobin A1c  Seasonal allergies Assessment & Plan: Will treat with prednisone , she has head congestion that has been ongoing for the last 2-3 weeks. OTC medications are not effective  Orders: -     predniSONE ; Take as directed  Dispense: 21 tablet; Refill: 0  COVID-19 vaccination declined Assessment & Plan: Declines covid 19 vaccine. Discussed risk of covid 98 and if she changes her mind about the vaccine to call the office. Education has been provided regarding the importance of this vaccine but patient still declined. Advised may receive this vaccine at local pharmacy or Health Dept.or vaccine  clinic. Aware to provide a copy of the vaccination record if obtained from local pharmacy or Health Dept.  Encouraged to take multivitamin, vitamin d, vitamin c and zinc to  increase immune system. Aware can call office if would like to have vaccine here at office. Verbalized acceptance and understanding.    Overweight with body mass index (BMI) of 25 to 25.9 in adult    Return for keep same next; 1 month [place] Blood pressure check.  Patient was given opportunity to ask questions. Patient verbalized understanding of the plan and was able to repeat key elements of the plan. All questions were answered to their satisfaction.    Inge Mangle, FNP, have reviewed all documentation for this visit. The documentation on 02/17/24 for the exam, diagnosis, procedures, and orders are all accurate and complete.   IF YOU HAVE BEEN REFERRED TO A SPECIALIST, IT MAY TAKE 1-2 WEEKS TO SCHEDULE/PROCESS THE REFERRAL. IF YOU HAVE NOT HEARD FROM US /SPECIALIST IN TWO WEEKS, PLEASE GIVE US  A CALL AT [phone].

## 2024-02-26 ENCOUNTER — Encounter: Payer: Self-pay | Admitting: Nurse Practitioner

## 2024-02-26 DIAGNOSIS — Z2821 Immunization not carried out because of patient refusal: Secondary | ICD-10-CM | POA: Insufficient documentation

## 2024-02-26 DIAGNOSIS — J302 Other seasonal allergic rhinitis: Secondary | ICD-10-CM | POA: Insufficient documentation

## 2024-02-26 DIAGNOSIS — Z6825 Body mass index (BMI) 25.0-25.9, adult: Secondary | ICD-10-CM | POA: Insufficient documentation

## 2024-02-26 NOTE — Assessment & Plan Note (Signed)
 HgbA1c is stable. Continue focusing on healthy diet low in sugar and starches.

## 2024-02-26 NOTE — Assessment & Plan Note (Signed)
 Blood pressure is elevated, repeat is slightly improved.

## 2024-02-26 NOTE — Assessment & Plan Note (Signed)
 Will treat with prednisone , she has head congestion that has been ongoing for the last 2-3 weeks. OTC medications are not effective

## 2024-02-26 NOTE — Assessment & Plan Note (Signed)

## 2024-04-02 ENCOUNTER — Other Ambulatory Visit: Payer: Self-pay | Admitting: Nurse Practitioner

## 2024-04-02 DIAGNOSIS — E7800 Pure hypercholesterolemia, unspecified: Secondary | ICD-10-CM

## 2024-04-02 DIAGNOSIS — I10 Essential (primary) hypertension: Secondary | ICD-10-CM

## 2024-04-08 ENCOUNTER — Other Ambulatory Visit: Payer: Self-pay | Admitting: Nurse Practitioner

## 2024-04-08 DIAGNOSIS — I10 Essential (primary) hypertension: Secondary | ICD-10-CM

## 2024-04-23 ENCOUNTER — Other Ambulatory Visit: Payer: Self-pay | Admitting: Hematology and Oncology

## 2024-05-07 ENCOUNTER — Telehealth: Payer: Self-pay

## 2024-05-07 DIAGNOSIS — Z170 Estrogen receptor positive status [ER+]: Secondary | ICD-10-CM

## 2024-05-07 NOTE — Telephone Encounter (Signed)
 [id number] Understanding and Predicting Breast Cancer Events after Treatment (UPBEAT)  Study Follow-up - Year 5   Ms. Colson was contacted by phone at 3:45 PM regarding the Year 5 follow-up for the above study. Patient reported doing well and denied any hospitalizations since the last follow-up call on 08/22/23. She also denied experiencing any cardiac events, including myocardial infarction (MI), percutaneous coronary intervention (PCI), coronary artery bypass grafting (CABG), heart catheterization, cerebrovascular accident (CVA), or a diagnosis of heart failure since the last contact.   Patient confirmed receipt of the study email regarding the annual follow-up questionnaire. She has completed the [employer] questionnaire online.  Patient confirmed that her email address remains the same. She was informed that the next follow-up call will be in approximately one year.    Patient stated she has no questions at this time and she knows to contact the research team with any study-related concerns in the future. Patient was thanked for her time and her continued participation to the study.   Antowan Samford, Ph.D. Clinical Research Coordinator [phone] 05/07/2024 4:00 PM

## 2024-05-11 LAB — HM MAMMOGRAPHY

## 2024-05-27 ENCOUNTER — Ambulatory Visit: Payer: Medicare Other

## 2024-05-27 VITALS — BP 138/80 | HR 68 | Temp 98.3°F | Ht 67.0 in | Wt 168.6 lb

## 2024-05-27 DIAGNOSIS — Z0000 Encounter for general adult medical examination without abnormal findings: Secondary | ICD-10-CM

## 2024-05-27 NOTE — Patient Instructions (Signed)
 Ms. Dorton , Thank you for taking time out of your busy schedule to complete your Annual Wellness Visit with me. I enjoyed our conversation and look forward to speaking with you again next year. I, as well as your care team,  appreciate your ongoing commitment to your health goals. Please review the following plan we discussed and let me know if I can assist you in the future. Your Game plan/ To Do List    Referrals: If you haven't heard from the office you've been referred to, please reach out to them at the phone provided.   Follow up Visits: We will see or speak with you next year for your Next Medicare AWV with our clinical staff Have you seen your provider in the last 6 months (3 months if uncontrolled diabetes)? Yes  Clinician Recommendations:  Aim for 30 minutes of exercise or brisk walking, 6-8 glasses of water , and 5 servings of fruits and vegetables each day.       This is a list of the screenings recommended for you:  Health Maintenance  Topic Date Due   COVID-19 Vaccine (6 - 2024-25 season) 06/02/2023   Flu Shot  05/01/2024   Mammogram  05/11/2025   Medicare Annual Wellness Visit  05/27/2025   DTaP/Tdap/Td vaccine (3 - Td or Tdap) 04/09/2028   Colon Cancer Screening  12/02/2028   Pneumococcal Vaccine for age over 2  Completed   DEXA scan (bone density measurement)  Completed   Hepatitis C Screening  Completed   Zoster (Shingles) Vaccine  Completed   HPV Vaccine  Aged Out   Meningitis B Vaccine  Aged Out    Advanced directives: (Declined) Advance directive discussed with you today. Even though you declined this today, please call our office should you change your mind, and we can give you the proper paperwork for you to fill out. Advance Care Planning is important because it:  [x]  Makes sure you receive the medical care that is consistent with your values, goals, and preferences  [x]  It provides guidance to your family and loved ones and reduces their decisional burden  about whether or not they are making the right decisions based on your wishes.  Follow the link provided in your after visit summary or read over the paperwork we have mailed to you to help you started getting your Advance Directives in place. If you need assistance in completing these, please reach out to us  so that we can help you!  See attachments for Preventive Care and Fall Prevention Tips.

## 2024-05-27 NOTE — Progress Notes (Signed)
 Subjective:   Emily Ross is a 68 y.o. who presents for a Medicare Wellness preventive visit.  As a reminder, Annual Wellness Visits don't include a physical exam, and some assessments may be limited, especially if this visit is performed virtually. We may recommend an in-person follow-up visit with your provider if needed.  Visit Complete: In person    Persons Participating in Visit: Patient.  AWV Questionnaire: Yes: Patient Medicare AWV questionnaire was completed by the patient on 05/25/2024; I have confirmed that all information answered by patient is correct and no changes since this date.  Cardiac Risk Factors include: hypertension     Objective:    Today's Vitals   05/27/24 1106 05/27/24 1118  BP: (!) 140/90 138/80  Pulse: 68   Temp: 98.3 F (36.8 C)   TempSrc: Oral   SpO2: 98%   Weight: 168 lb 9.6 oz (76.5 kg)   Height: 5' 7 (1.702 m)    Body mass index is 26.41 kg/m.     05/27/2024   11:11 AM 05/27/2023   10:59 AM 05/22/2023   11:03 AM 10/29/2022   10:34 AM 06/28/2022   11:37 AM 02/13/2022   12:22 PM 11/20/2021   10:41 AM  Advanced Directives  Does Patient Have a Medical Advance Directive? No No No No No No No  Would patient like information on creating a medical advance directive? No - Patient declined   No - Patient declined  No - Patient declined No - Patient declined    Current Medications (verified) Outpatient Encounter Medications as of 05/27/2024  Medication Sig   acetaminophen  (TYLENOL ) 500 MG tablet Take 2 tablets (1,000 mg total) by mouth every 8 (eight) hours as needed.   amLODipine  (NORVASC ) 2.5 MG tablet Take 1 tablet (2.5 mg total) by mouth daily.   anastrozole  (ARIMIDEX ) 1 MG tablet TAKE ONE TABLET BY MOUTH ONE TIME DAILY   Cholecalciferol (DIALYVITE VITAMIN D 5000) 125 MCG (5000 UT) capsule Take 5,000 Units by mouth daily.   diphenhydrAMINE  (BENADRYL  ALLERGY) 25 mg capsule 1 tablet at bedtime as needed Orally as needed   EPINEPHrine   (EPIPEN  2-PAK) 0.3 mg/0.3 mL IJ SOAJ injection Inject 0.3 mg into the muscle as needed for anaphylaxis.   famotidine  (PEPCID ) 10 MG tablet Take 10 mg by mouth as needed.   hydrochlorothiazide  (HYDRODIURIL ) 25 MG tablet TAKE ONE TABLET BY MOUTH ONCE A DAY   metoprolol  tartrate (LOPRESSOR ) 50 MG tablet TAKE ONE TABLET BY MOUTH TWICE DAILY   Multiple Vitamin (M.V.I. ADULT IV)    Red Yeast Rice Extract (RED YEAST RICE PO) Take 1 tablet by mouth at bedtime.   simvastatin  (ZOCOR ) 10 MG tablet TAKE ONE TABLET BY MOUTH DAILY AT BEDTIME   predniSONE  (STERAPRED UNI-PAK 21 TAB) 10 MG (21) TBPK tablet Take as directed (Patient not taking: Reported on 05/27/2024)   No facility-administered encounter medications on file as of 05/27/2024.    Allergies (verified) Tomato   History: Past Medical History:  Diagnosis Date   Allergy    Breast cancer (HCC)    2009 left breast, 2020 right breast   Family history of breast cancer    Family history of stomach cancer    High cholesterol    History of radiation therapy 08/23/19- 09/21/19   Right breast 15 fx of 2.67 Gy to total 40.05 Gy. Right Breast boost 5 fx of 2 Gy each to total 10 Gy   History of seasonal allergies    Hypertension    Pneumonia  Pre-diabetes    Wears glasses    Wears partial dentures    upper   Past Surgical History:  Procedure Laterality Date   AXILLARY SENTINEL NODE BIOPSY Right 04/17/2019   Procedure: Right Axillary Sentinel Node Biopsy;  Surgeon: Ebbie Cough, MD;  Location: [facility] OR;  Service: General;  Laterality: Right;   BREAST LUMPECTOMY     BREAST LUMPECTOMY WITH RADIOACTIVE SEED AND SENTINEL LYMPH NODE BIOPSY Right 04/17/2019   Procedure: RIGHT BREAST LUMPECTOMY WITH RADIOACTIVE SEED;  Surgeon: Ebbie Cough, MD;  Location: [facility] OR;  Service: General;  Laterality: Right;   CERVICAL CONIZATION W/BX N/A 11/20/2021   Procedure: CONIZATION CERVIX WITH BIOPSY;  Surgeon: Rosalva Sawyer, MD;  Location: [facility];  Service: Gynecology;  Laterality: N/A;   COLONOSCOPY     PORTACATH PLACEMENT Right 04/17/2019   Procedure: INSERTION PORT-A-CATH WITH ULTRASOUND;  Surgeon: Ebbie Cough, MD;  Location: [facility] OR;  Service: General;  Laterality: Right;   ROBOTIC ASSISTED LAPAROSCOPIC HYSTERECTOMY AND SALPINGECTOMY Bilateral 02/13/2022   Procedure: TOTAL ABDOMINALHYSTERECTOMY AND SALPINGO-OOPHORECTOMY;  Surgeon: Rosalva Sawyer, MD;  Location: [facility];  Service: Gynecology;  Laterality: Bilateral;   TUBAL LIGATION     WISDOM TOOTH EXTRACTION     Family History  Problem Relation Age of Onset   Diabetes Father    Hypertension Father    Stomach cancer Father 70   Stroke Father    Diabetes Mother    Hypertension Mother    Stroke Mother    Breast cancer Sister 83       negative genetic testing   Cancer Sister    Cancer Brother    Breast cancer Maternal Aunt 100   Kidney disease Sister    Colon cancer Neg Hx    Esophageal cancer Neg Hx    Rectal cancer Neg Hx    Social History   Socioeconomic History   Marital status: Married    Spouse name: Not on file   Number of children: Not on file   Years of education: Not on file   Highest education level: Associate degree: academic program  Occupational History   Not on file  Tobacco Use   Smoking status: Never   Smokeless tobacco: Never  Vaping Use   Vaping status: Never Used  Substance and Sexual Activity   Alcohol use: No   Drug use: No   Sexual activity: Not Currently    Birth control/protection: Post-menopausal, None    Comment: tubal ligation  Other Topics Concern   Not on file  Social History Narrative   ** Merged History Encounter **       Social Drivers of Health   Financial Resource Strain: Low Risk  (05/27/2024)   Overall Financial Resource Strain (CARDIA)    Difficulty of Paying Living Expenses: Not hard at all  Food Insecurity: No Food Insecurity (05/27/2024)   Hunger Vital Sign    Worried About Running  Out of Food in the Last Year: Never true    Ran Out of Food in the Last Year: Never true  Transportation Needs: No Transportation Needs (05/27/2024)   PRAPARE - Administrator, Civil Service (Medical): No    Lack of Transportation (Non-Medical): No  Physical Activity: Insufficiently Active (05/27/2024)   Exercise Vital Sign    Days of Exercise per Week: 4 days    Minutes of Exercise per Session: 30 min  Stress: No Stress Concern Present (05/27/2024)   [employer] of Occupational Health -  Occupational Stress Questionnaire    Feeling of Stress: Only a little  Social Connections: Socially Integrated (05/27/2024)   Social Connection and Isolation Panel    Frequency of Communication with Friends and Family: More than three times a week    Frequency of Social Gatherings with Friends and Family: More than three times a week    Attends Religious Services: More than 4 times per year    Active Member of [employer] or Organizations: Yes    Attends Engineer, structural: More than 4 times per year    Marital Status: Married    Tobacco Counseling Counseling given: Not Answered    Clinical Intake:  Pre-visit preparation completed: Yes  Pain : No/denies pain     Nutritional Status: BMI 25 -29 Overweight Nutritional Risks: None Diabetes: No  Lab Results  Component Value Date   HGBA1C 5.8 (H) 08/07/2023   HGBA1C 5.8 (H) 10/24/2022   HGBA1C 5.7 (H) 05/17/2022     How often do you need to have someone help you when you read instructions, pamphlets, or other written materials from your doctor or pharmacy?: 1 - Never  Interpreter Needed?: No  Information entered by :: NAllen LPN   Activities of Daily Living     05/25/2024    8:19 PM  In your present state of health, do you have any difficulty performing the following activities:  Hearing? 0  Vision? 0  Difficulty concentrating or making decisions? 0  Walking or climbing stairs? 0  Dressing or bathing? 0   Doing errands, shopping? 0  Preparing Food and eating ? N  Using the Toilet? N  In the past six months, have you accidently leaked urine? N  Do you have problems with loss of bowel control? N  Managing your Medications? N  Managing your Finances? N  Housekeeping or managing your Housekeeping? N    Patient Care Team: Georgina Speaks, FNP as PCP - General (General Practice) Odean Potts, MD as Consulting Physician (Hematology and Oncology) Ebbie Cough, MD as Consulting Physician (General Surgery) Izell Domino, MD as Attending Physician (Radiation Oncology)  I have updated your Care Teams any recent Medical Services you may have received from other providers in the past year.     Assessment:   This is a routine wellness examination for [place].  Hearing/Vision screen Hearing Screening - Comments:: Denies hearing issues Vision Screening - Comments:: Regular eye exams,    Goals Addressed             This Visit's Progress    Patient Stated       05/27/2024, wants to lose weight       Depression Screen     05/27/2024   11:12 AM 08/07/2023    3:15 PM 05/22/2023   11:06 AM 10/24/2022   11:33 AM 06/28/2022   11:38 AM 05/17/2022   10:00 AM 06/07/2021   11:55 AM  PHQ 2/9 Scores  PHQ - 2 Score 1 0 1 0 0 0 0  PHQ- 9 Score 1 1 1         Fall Risk     05/25/2024    8:19 PM 08/07/2023    3:15 PM 05/22/2023   11:05 AM 10/24/2022   11:33 AM 06/28/2022   11:38 AM  Fall Risk   Falls in the past year? 0 1 0 0 0  Number falls in past yr: 0 0 0 0 0  Injury with Fall? 0 1 0 0 0  Risk for fall  due to : Medication side effect No Fall Risks Medication side effect No Fall Risks Medication side effect  Follow up Falls prevention discussed;Falls evaluation completed Falls evaluation completed Falls prevention discussed;Falls evaluation completed Falls evaluation completed  Falls prevention discussed;Education provided;Falls evaluation completed      Data saved with a previous flowsheet  row definition    MEDICARE RISK AT HOME:  Medicare Risk at Home Any stairs in or around the home?: (Patient-Rptd) Yes If so, are there any without handrails?: (Patient-Rptd) No Home free of loose throw rugs in walkways, pet beds, electrical cords, etc?: (Patient-Rptd) Yes Adequate lighting in your home to reduce risk of falls?: (Patient-Rptd) Yes Life alert?: (Patient-Rptd) No Use of a cane, walker or w/c?: (Patient-Rptd) No Grab bars in the bathroom?: (Patient-Rptd) No Shower chair or bench in shower?: (Patient-Rptd) No Elevated toilet seat or a handicapped toilet?: (Patient-Rptd) No  TIMED UP AND GO:  Was the test performed?  Yes  Length of time to ambulate 10 feet: 5 sec Gait steady and fast without use of assistive device  Cognitive Function: 6CIT completed        05/27/2024   11:14 AM 05/22/2023   11:07 AM 06/28/2022   11:39 AM 06/07/2021   11:56 AM 05/10/2020    2:27 PM  6CIT Screen  What Year? 0 points 0 points 0 points 0 points 0 points  What month? 0 points 0 points 0 points 0 points 0 points  What time? 0 points 0 points 0 points 0 points 0 points  Count back from 20 0 points 0 points 0 points 0 points 0 points  Months in reverse 0 points 0 points 0 points 0 points 0 points  Repeat phrase 0 points 2 points 2 points 4 points 0 points  Total Score 0 points 2 points 2 points 4 points 0 points    Immunizations Immunization History  Administered Date(s) Administered   Fluad Quad(high Dose 65+) 06/07/2021, 06/14/2022   INFLUENZA, HIGH DOSE SEASONAL PF 07/04/2020   Influenza,inj,Quad PF,6+ Mos 06/12/2019   PFIZER(Purple Top)SARS-COV-2 Vaccination 12/05/2019, 12/26/2019, 07/06/2020, 03/11/2021   PNEUMOCOCCAL CONJUGATE-20 12/18/2021   Pfizer Covid-19 Vaccine Bivalent Booster 10yrs & up 07/12/2021   Pneumococcal Polysaccharide-23 11/15/2020   Tdap 05/22/2011, 04/09/2018   Zoster Recombinant(Shingrix ) 08/02/2021, 11/28/2021    Screening Tests Health Maintenance   Topic Date Due   COVID-19 Vaccine (6 - 2024-25 season) 06/02/2023   INFLUENZA VACCINE  05/01/2024   MAMMOGRAM  05/11/2025   Medicare Annual Wellness (AWV)  05/27/2025   DTaP/Tdap/Td (3 - Td or Tdap) 04/09/2028   Colonoscopy  12/02/2028   Pneumococcal Vaccine: 50+ Years  Completed   DEXA SCAN  Completed   Hepatitis C Screening  Completed   Zoster Vaccines- Shingrix   Completed   HPV VACCINES  Aged Out   Meningococcal B Vaccine  Aged Out    Health Maintenance  Health Maintenance Due  Topic Date Due   COVID-19 Vaccine (6 - 2024-25 season) 06/02/2023   INFLUENZA VACCINE  05/01/2024   Health Maintenance Items Addressed: Due for flu and covid vaccine.  Additional Screening:  Vision Screening: Recommended annual ophthalmology exams for early detection of glaucoma and other disorders of the eye. Would you like a referral to an eye doctor? No    Dental Screening: Recommended annual dental exams for proper oral hygiene  Community Resource Referral / Chronic Care Management: CRR required this visit?  No   CCM required this visit?  No   Plan:  I have personally reviewed and noted the following in the patient's chart:   Medical and social history Use of alcohol, tobacco or illicit drugs  Current medications and supplements including opioid prescriptions. Patient is not currently taking opioid prescriptions. Functional ability and status Nutritional status Physical activity Advanced directives List of other physicians Hospitalizations, surgeries, and ER visits in previous 12 months Vitals Screenings to include cognitive, depression, and falls Referrals and appointments  In addition, I have reviewed and discussed with patient  certain preventive protocols, quality metrics, and best practice recommendations. A written personalized care plan for preventive services as well as general preventive health recommendations were provided to patient.   Ardella FORBES Dawn,  LPN   [id number]/7974   After Visit Summary: (In Person-Declined) Patient declined AVS at this time.  Notes: Nothing significant to report at this time.

## 2024-07-01 ENCOUNTER — Other Ambulatory Visit: Payer: Self-pay | Admitting: Nurse Practitioner

## 2024-07-01 DIAGNOSIS — I10 Essential (primary) hypertension: Secondary | ICD-10-CM

## 2024-07-01 DIAGNOSIS — E7800 Pure hypercholesterolemia, unspecified: Secondary | ICD-10-CM

## 2024-07-04 ENCOUNTER — Other Ambulatory Visit: Payer: Self-pay | Admitting: Nurse Practitioner

## 2024-07-04 DIAGNOSIS — I10 Essential (primary) hypertension: Secondary | ICD-10-CM

## 2024-08-10 ENCOUNTER — Encounter: Payer: Self-pay | Admitting: Nurse Practitioner

## 2024-08-10 ENCOUNTER — Ambulatory Visit (INDEPENDENT_AMBULATORY_CARE_PROVIDER_SITE_OTHER): Payer: Medicare Other | Admitting: Nurse Practitioner

## 2024-08-10 VITALS — BP 130/80 | HR 65 | Temp 98.9°F | Ht 67.0 in | Wt 168.2 lb

## 2024-08-10 DIAGNOSIS — Z79899 Other long term (current) drug therapy: Secondary | ICD-10-CM

## 2024-08-10 DIAGNOSIS — R7303 Prediabetes: Secondary | ICD-10-CM

## 2024-08-10 DIAGNOSIS — E7800 Pure hypercholesterolemia, unspecified: Secondary | ICD-10-CM | POA: Diagnosis not present

## 2024-08-10 DIAGNOSIS — H6123 Impacted cerumen, bilateral: Secondary | ICD-10-CM | POA: Diagnosis not present

## 2024-08-10 DIAGNOSIS — I10 Essential (primary) hypertension: Secondary | ICD-10-CM | POA: Diagnosis not present

## 2024-08-10 DIAGNOSIS — Z0000 Encounter for general adult medical examination without abnormal findings: Secondary | ICD-10-CM

## 2024-08-10 DIAGNOSIS — Z6826 Body mass index (BMI) 26.0-26.9, adult: Secondary | ICD-10-CM

## 2024-08-10 DIAGNOSIS — E663 Overweight: Secondary | ICD-10-CM

## 2024-08-10 DIAGNOSIS — Z853 Personal history of malignant neoplasm of breast: Secondary | ICD-10-CM

## 2024-08-10 LAB — POCT URINALYSIS DIP (CLINITEK)
Bilirubin, UA: NEGATIVE
Blood, UA: NEGATIVE
Glucose, UA: NEGATIVE mg/dL
Ketones, POC UA: NEGATIVE mg/dL
Leukocytes, UA: NEGATIVE
Nitrite, UA: NEGATIVE
POC PROTEIN,UA: NEGATIVE
Spec Grav, UA: 1.015 (ref 1.010–1.025)
Urobilinogen, UA: 0.2 U/dL
pH, UA: 7.5 (ref 5.0–8.0)

## 2024-08-10 MED ORDER — AMLODIPINE BESYLATE 5 MG PO TABS: 5.0000 mg | ORAL_TABLET | Freq: Every day | ORAL | 1 refills | Status: AC

## 2024-08-10 NOTE — Progress Notes (Signed)
 LILLETTE Kristeen JINNY Gladis, CMA,acting as a neurosurgeon for Emily Ada, FNP.,have documented all relevant documentation on the behalf of Emily Ada, FNP,as directed by  Emily Ada, FNP while in the presence of Emily Ada, FNP.  Subjective:    Patient ID: Emily Ross , female    DOB: 1956-02-16 , 68 y.o.   MRN: [id number]  Chief Complaint  Patient presents with   Annual Exam    Patient presents today for HM, Patient reports compliance with medication. Patient denies any chest pain, SOB, or headaches. Patient has no concerns today.     HPI  Discussed the use of AI scribe software for clinical note transcription with the patient, who gave verbal consent to proceed.  History of Present Illness Emily Ross is a 68 year old female who presents for an annual physical exam.  She is experiencing weight gain, which she attributes to the cessation of hormonal treatment for breast cancer hormone suppression. This has led to weight fluctuations, and she is concerned about this change. She was previously in menopause but still had some hormone activity and experiences hot flashes.  She maintains an active lifestyle, walking or running at least a mile daily, often reaching two to two and a half miles. Her diet primarily consists of oatmeal for breakfast, with minimal sugar intake, and she focuses on vegetables and salads. She acknowledges a low protein intake.  She is concerned about her blood pressure, which was recorded at 160/90 mmHg today. She is diligent about taking her medication, which is currently at a dose of 2.5 mg. She is mindful of her sodium intake due to sensitivity.  No issues with constipation, diarrhea, or urinary problems. She performs monthly breast exams and has upper dentures. No trouble swallowing.  Past Medical History:  Diagnosis Date   Allergy    Breast cancer (HCC)    2009 left breast, 2020 right breast   Family history of breast cancer    Family history of stomach cancer     High cholesterol    History of radiation therapy 08/23/19- 09/21/19   Right breast 15 fx of 2.67 Gy to total 40.05 Gy. Right Breast boost 5 fx of 2 Gy each to total 10 Gy   History of seasonal allergies    Hypertension    Pneumonia    Pre-diabetes    Wears glasses    Wears partial dentures    upper     Family History  Problem Relation Age of Onset   Diabetes Father    Hypertension Father    Stomach cancer Father 65   Stroke Father    Diabetes Mother    Hypertension Mother    Stroke Mother    Breast cancer Sister 10       negative genetic testing   Cancer Sister    Heart disease Sister    Cancer Brother    Breast cancer Maternal Aunt 53   Kidney disease Sister    Colon cancer Neg Hx    Esophageal cancer Neg Hx    Rectal cancer Neg Hx      Current Outpatient Medications:    acetaminophen  (TYLENOL ) 500 MG tablet, Take 2 tablets (1,000 mg total) by mouth every 8 (eight) hours as needed., Disp: 30 tablet, Rfl: 0   anastrozole  (ARIMIDEX ) 1 MG tablet, TAKE ONE TABLET BY MOUTH ONE TIME DAILY, Disp: 90 tablet, Rfl: 3   Cholecalciferol (DIALYVITE VITAMIN D 5000) 125 MCG (5000 UT) capsule, Take 5,000 Units by mouth  daily., Disp: , Rfl:    diphenhydrAMINE  (BENADRYL  ALLERGY) 25 mg capsule, 1 tablet at bedtime as needed Orally as needed, Disp: , Rfl:    EPINEPHrine  (EPIPEN  2-PAK) 0.3 mg/0.3 mL IJ SOAJ injection, Inject 0.3 mg into the muscle as needed for anaphylaxis., Disp: 2 each, Rfl: 1   famotidine  (PEPCID ) 10 MG tablet, Take 10 mg by mouth as needed., Disp: , Rfl:    hydrochlorothiazide  (HYDRODIURIL ) 25 MG tablet, TAKE ONE TABLET BY MOUTH ONCE A DAY, Disp: 90 tablet, Rfl: 0   metoprolol  tartrate (LOPRESSOR ) 50 MG tablet, TAKE ONE TABLET BY MOUTH TWICE DAILY, Disp: 180 tablet, Rfl: 0   Multiple Vitamin (M.V.I. ADULT IV), , Disp: , Rfl:    Red Yeast Rice Extract (RED YEAST RICE PO), Take 1 tablet by mouth at bedtime., Disp: , Rfl:    simvastatin  (ZOCOR ) 10 MG tablet, TAKE ONE  TABLET BY MOUTH DAILY AT BEDTIME, Disp: 90 tablet, Rfl: 0   amLODipine  (NORVASC ) 5 MG tablet, Take 1 tablet (5 mg total) by mouth daily., Disp: 90 tablet, Rfl: 1   predniSONE  (STERAPRED UNI-PAK 21 TAB) 10 MG (21) TBPK tablet, Take as directed (Patient not taking: Reported on 05/27/2024), Disp: 21 tablet, Rfl: 0   Allergies  Allergen Reactions   Tomato Swelling      The patient states she uses post menopausal status for birth control. Patient's last menstrual period was 12/03/2011.  Negative for: breast discharge, breast lump(s), breast pain and breast self exam. Associated symptoms include abnormal vaginal bleeding. Pertinent negatives include abnormal bleeding (hematology), anxiety, decreased libido, depression, difficulty falling sleep, dyspareunia, history of infertility, nocturia, sexual dysfunction, sleep disturbances, urinary incontinence, urinary urgency, vaginal discharge and vaginal itching. Diet regular; oatmeal for breakfast. She has been limiting her sugar and sweets intake. The patient states her exercise level is moderate with run/walking 2-2.5 miles a day.   The patient's tobacco use is:  Social History   Tobacco Use  Smoking Status Never  Smokeless Tobacco Never   She has been exposed to passive smoke. The patient's alcohol use is:  Social History   Substance and Sexual Activity  Alcohol Use No    Review of Systems  Constitutional: Negative.   HENT: Negative.    Eyes: Negative.   Respiratory: Negative.    Cardiovascular: Negative.   Gastrointestinal: Negative.   Endocrine: Negative.   Genitourinary: Negative.   Musculoskeletal: Negative.   Skin: Negative.   Allergic/Immunologic: Negative.   Neurological: Negative.   Hematological: Negative.   Psychiatric/Behavioral: Negative.       Today's Vitals   08/10/24 1459 08/10/24 1544  BP: (!) 160/90 130/80  Pulse: 65   Temp: 98.9 F (37.2 C)   TempSrc: Oral   Weight: 168 lb 3.2 oz (76.3 kg)   Height: 5' 7  (1.702 m)   PainSc: 0-No pain    Body mass index is 26.34 kg/m.  Wt Readings from Last 3 Encounters:  08/10/24 168 lb 3.2 oz (76.3 kg)  05/27/24 168 lb 9.6 oz (76.5 kg)  02/17/24 164 lb 12.8 oz (74.8 kg)     Objective:  Physical Exam Vitals and nursing note reviewed.  Constitutional:      General: She is not in acute distress.    Appearance: Normal appearance. She is well-developed.  HENT:     Head: Normocephalic and atraumatic.     Right Ear: Ear canal and external ear normal. There is impacted cerumen.     Left Ear: Ear canal and external  ear normal. There is impacted cerumen.     Nose: Nose normal.     Mouth/Throat:     Mouth: Mucous membranes are moist.  Eyes:     General: Lids are normal.     Extraocular Movements: Extraocular movements intact.     Conjunctiva/sclera: Conjunctivae normal.     Pupils: Pupils are equal, round, and reactive to light.     Funduscopic exam:    Right eye: No papilledema.        Left eye: No papilledema.  Neck:     Thyroid: No thyroid mass.     Vascular: No carotid bruit.  Cardiovascular:     Rate and Rhythm: Normal rate and regular rhythm.     Pulses: Normal pulses.     Heart sounds: Normal heart sounds. No murmur heard. Pulmonary:     Effort: Pulmonary effort is normal. No respiratory distress.     Breath sounds: Normal breath sounds. No wheezing.  Chest:  Breasts:    Tanner Score is 5.  Abdominal:     General: Abdomen is flat. Bowel sounds are normal. There is no distension.     Palpations: Abdomen is soft.     Tenderness: There is no abdominal tenderness.  Genitourinary:    Comments: Followed by GYN Musculoskeletal:        General: No swelling. Normal range of motion.     Cervical back: Full passive range of motion without pain, normal range of motion and neck supple.     Right lower leg: No edema.     Left lower leg: No edema.  Skin:    General: Skin is warm and dry.     Capillary Refill: Capillary refill takes less than 2  seconds.     Comments: Left breast with scar tissue present upper outer breast Healed scars to right chest where port a cath was.   Neurological:     General: No focal deficit present.     Mental Status: She is alert and oriented to person, place, and time.     Cranial Nerves: No cranial nerve deficit.     Sensory: No sensory deficit.  Psychiatric:        Mood and Affect: Mood normal.        Behavior: Behavior normal.        Thought Content: Thought content normal.        Judgment: Judgment normal.      Assessment And Plan:     Encounter for annual health examination Assessment & Plan: Discussed dietary habits, exercise, and weight management. Emphasized protein intake and strength training for weight loss. - Encouraged increased protein intake through diet or protein shakes. - Recommended incorporating strength training exercises. - Advised to continue regular exercise and hydration.   Essential hypertension Assessment & Plan: Blood pressure elevated at 160/90 mmHg. Current Amlodipine  2.5 mg daily. Hormone suppression therapy may contribute. Sodium sensitivity noted. - Increased Amlodipine  to 5 mg daily. Instructed to take two 2.5 mg tablets until finished, then switch to new prescription. - Rechecked blood pressure before leaving the clinic. - Continue mindful sodium intake.  Orders: -     EKG 12-Lead -     POCT URINALYSIS DIP (CLINITEK) -     Microalbumin / creatinine urine ratio -     CMP14+EGFR -     amLODIPine  Besylate; Take 1 tablet (5 mg total) by mouth daily.  Dispense: 90 tablet; Refill: 1  Elevated LDL cholesterol level -  Lipid panel  Prediabetes -     Hemoglobin A1c  Overweight with body mass index (BMI) of 26 to 26.9 in adult Assessment & Plan: Weight gain of 4 pounds. Discussed dietary habits and exercise. Emphasized protein intake and strength training for weight loss. - Encouraged increased protein intake through diet or protein shakes. -  Recommended incorporating strength training exercises. - Advised to be mindful of caloric intake and exercise routine.   Other long term (current) drug therapy -     CBC with Differential/Platelet  Bilateral impacted cerumen Assessment & Plan: Wax is removed by with lavage with elephant ear with 1/2 water  and 1/2 peroxide. Instructions for home care to prevent wax buildup are given.   Orders: -     Ear Lavage  History of breast cancer    Return for 1 year physical, 6 month bp check. Patient was given opportunity to ask questions. Patient verbalized understanding of the plan and was able to repeat key elements of the plan. All questions were answered to their satisfaction.   Emily Ada, FNP  I, Emily Ada, FNP, have reviewed all documentation for this visit. The documentation on 08/10/24 for the exam, diagnosis, procedures, and orders are all accurate and complete.

## 2024-08-11 LAB — CBC WITH DIFFERENTIAL/PLATELET
Basophils Absolute: 0 x10E3/uL (ref 0.0–0.2)
Basos: 1 %
EOS (ABSOLUTE): 0.1 x10E3/uL (ref 0.0–0.4)
Eos: 3 %
Hematocrit: 36.6 % (ref 34.0–46.6)
Hemoglobin: 12 g/dL (ref 11.1–15.9)
Immature Grans (Abs): 0 x10E3/uL (ref 0.0–0.1)
Immature Granulocytes: 0 %
Lymphocytes Absolute: 1.7 x10E3/uL (ref 0.7–3.1)
Lymphs: 35 %
MCH: 31 pg (ref 26.6–33.0)
MCHC: 32.8 g/dL (ref 31.5–35.7)
MCV: 95 fL (ref 79–97)
Monocytes Absolute: 0.3 x10E3/uL (ref 0.1–0.9)
Monocytes: 7 %
Neutrophils Absolute: 2.7 x10E3/uL (ref 1.4–7.0)
Neutrophils: 54 %
Platelets: 299 x10E3/uL (ref 150–450)
RBC: 3.87 x10E6/uL (ref 3.77–5.28)
RDW: 12.5 % (ref 11.7–15.4)
WBC: 4.8 x10E3/uL (ref 3.4–10.8)

## 2024-08-11 LAB — CMP14+EGFR
ALT: 16 IU/L (ref 0–32)
AST: 19 IU/L (ref 0–40)
Albumin: 4.6 g/dL (ref 3.9–4.9)
Alkaline Phosphatase: 80 IU/L (ref 49–135)
BUN/Creatinine Ratio: 13 (ref 12–28)
BUN: 11 mg/dL (ref 8–27)
Bilirubin Total: 0.5 mg/dL (ref 0.0–1.2)
CO2: 25 mmol/L (ref 20–29)
Calcium: 10.5 mg/dL — ABNORMAL HIGH (ref 8.7–10.3)
Chloride: 99 mmol/L (ref 96–106)
Creatinine, Ser: 0.87 mg/dL (ref 0.57–1.00)
Globulin, Total: 3 g/dL (ref 1.5–4.5)
Glucose: 99 mg/dL (ref 70–99)
Potassium: 4.1 mmol/L (ref 3.5–5.2)
Sodium: 139 mmol/L (ref 134–144)
Total Protein: 7.6 g/dL (ref 6.0–8.5)
eGFR: 72 mL/min/1.73 (ref >59)

## 2024-08-11 LAB — LIPID PANEL
Chol/HDL Ratio: 2.5 ratio (ref 0.0–4.4)
Cholesterol, Total: 178 mg/dL (ref 100–199)
HDL: 72 mg/dL (ref >39)
LDL Chol Calc (NIH): 94 mg/dL (ref 0–99)
Triglycerides: 61 mg/dL (ref 0–149)
VLDL Cholesterol Cal: 12 mg/dL (ref 5–40)

## 2024-08-11 LAB — HEMOGLOBIN A1C
Est. average glucose Bld gHb Est-mCnc: 117 mg/dL
Hgb A1c MFr Bld: 5.7 % — ABNORMAL HIGH (ref 4.8–5.6)

## 2024-08-11 LAB — MICROALBUMIN / CREATININE URINE RATIO
Creatinine, Urine: 93.6 mg/dL
Microalb/Creat Ratio: 6 mg/g{creat} (ref 0–29)
Microalbumin, Urine: 6 ug/mL

## 2024-08-16 ENCOUNTER — Ambulatory Visit: Payer: Self-pay | Admitting: Nurse Practitioner

## 2024-08-16 DIAGNOSIS — Z6826 Body mass index (BMI) 26.0-26.9, adult: Secondary | ICD-10-CM | POA: Insufficient documentation

## 2024-08-16 DIAGNOSIS — H6123 Impacted cerumen, bilateral: Secondary | ICD-10-CM | POA: Insufficient documentation

## 2024-08-16 NOTE — Assessment & Plan Note (Signed)
 Wax is removed by with lavage with elephant ear with 1/2 water and 1/2 peroxide. Instructions for home care to prevent wax buildup are given.

## 2024-08-16 NOTE — Assessment & Plan Note (Signed)
 Weight gain of 4 pounds. Discussed dietary habits and exercise. Emphasized protein intake and strength training for weight loss. - Encouraged increased protein intake through diet or protein shakes. - Recommended incorporating strength training exercises. - Advised to be mindful of caloric intake and exercise routine.

## 2024-08-16 NOTE — Assessment & Plan Note (Signed)
 Blood pressure elevated at 160/90 mmHg. Current Amlodipine  2.5 mg daily. Hormone suppression therapy may contribute. Sodium sensitivity noted. - Increased Amlodipine  to 5 mg daily. Instructed to take two 2.5 mg tablets until finished, then switch to new prescription. - Rechecked blood pressure before leaving the clinic. - Continue mindful sodium intake.

## 2024-08-16 NOTE — Patient Instructions (Signed)
 Health Maintenance  Topic Date Due   COVID-19 Vaccine (6 - 2025-26 season) 08/26/2024*   Flu Shot  12/29/2024*   Breast Cancer Screening  05/11/2025   Medicare Annual Wellness Visit  05/27/2025   DTaP/Tdap/Td vaccine (3 - Td or Tdap) 04/09/2028   Colon Cancer Screening  12/02/2028   Pneumococcal Vaccine for age over 5  Completed   DEXA scan (bone density measurement)  Completed   Hepatitis C Screening  Completed   Zoster (Shingles) Vaccine  Completed   Meningitis B Vaccine  Aged Out  *Topic was postponed. The date shown is not the original due date.

## 2024-08-16 NOTE — Assessment & Plan Note (Signed)
 Discussed dietary habits, exercise, and weight management. Emphasized protein intake and strength training for weight loss. - Encouraged increased protein intake through diet or protein shakes. - Recommended incorporating strength training exercises. - Advised to continue regular exercise and hydration.

## 2024-09-30 ENCOUNTER — Other Ambulatory Visit: Payer: Self-pay | Admitting: Nurse Practitioner

## 2024-09-30 DIAGNOSIS — E7800 Pure hypercholesterolemia, unspecified: Secondary | ICD-10-CM

## 2024-09-30 DIAGNOSIS — I10 Essential (primary) hypertension: Secondary | ICD-10-CM

## 2024-10-02 ENCOUNTER — Other Ambulatory Visit: Payer: Self-pay | Admitting: Nurse Practitioner

## 2024-10-02 DIAGNOSIS — I10 Essential (primary) hypertension: Secondary | ICD-10-CM

## 2024-11-02 ENCOUNTER — Inpatient Hospital Stay: Payer: Medicare Other | Admitting: Hematology and Oncology

## 2025-02-08 ENCOUNTER — Ambulatory Visit: Admitting: Nurse Practitioner

## 2025-06-30 ENCOUNTER — Ambulatory Visit: Payer: Self-pay

## 2025-08-11 ENCOUNTER — Encounter: Admitting: Nurse Practitioner
